# Patient Record
Sex: Male | Born: 1945 | Race: White | Hispanic: No | Marital: Married | Smoking: Never smoker
Health system: Southern US, Community
[De-identification: ages and names within clinical notes are randomized; demographics above are authoritative.]

## PROBLEM LIST (undated history)

## (undated) DIAGNOSIS — K429 Umbilical hernia without obstruction or gangrene: Secondary | ICD-10-CM

## (undated) DIAGNOSIS — F1010 Alcohol abuse, uncomplicated: Secondary | ICD-10-CM

## (undated) DIAGNOSIS — K5792 Diverticulitis of intestine, part unspecified, without perforation or abscess without bleeding: Secondary | ICD-10-CM

## (undated) HISTORY — DX: Umbilical hernia without obstruction or gangrene: K42.9

## (undated) HISTORY — DX: Alcohol abuse, uncomplicated: F10.10

## (undated) HISTORY — DX: Diverticulitis of intestine, part unspecified, without perforation or abscess without bleeding: K57.92

## (undated) NOTE — *Deleted (*Deleted)
SATURATION QUALIFICATIONS: (This note is used to comply with regulatory documentation for home oxygen)  Patient Saturations on Room Air at Rest = ***%  Patient Saturations on Room Air while Ambulating = ***%  Patient Saturations on *** Liters of oxygen while Ambulating = ***%  Please briefly explain why patient needs home oxygen: 

---

## 2013-04-30 ENCOUNTER — Emergency Department: Payer: Self-pay | Admitting: Emergency Medicine

## 2013-04-30 LAB — COMPREHENSIVE METABOLIC PANEL
Albumin: 3.7 g/dL (ref 3.4–5.0)
Alkaline Phosphatase: 70 U/L (ref 50–136)
Anion Gap: 6 — ABNORMAL LOW (ref 7–16)
Bilirubin,Total: 0.2 mg/dL (ref 0.2–1.0)
Calcium, Total: 9 mg/dL (ref 8.5–10.1)
Co2: 28 mmol/L (ref 21–32)
Creatinine: 1.05 mg/dL (ref 0.60–1.30)
EGFR (African American): >60
EGFR (Non-African Amer.): >60
Glucose: 144 mg/dL — ABNORMAL HIGH (ref 65–99)
Potassium: 4 mmol/L (ref 3.5–5.1)
SGOT(AST): 23 U/L (ref 15–37)
SGPT (ALT): 31 U/L (ref 12–78)
Sodium: 139 mmol/L (ref 136–145)
Total Protein: 7.7 g/dL (ref 6.4–8.2)

## 2013-04-30 LAB — CBC
HCT: 42.2 % (ref 40.0–52.0)
HGB: 14.5 g/dL (ref 13.0–18.0)
MCHC: 34.3 g/dL (ref 32.0–36.0)
MCV: 101 fL — ABNORMAL HIGH (ref 80–100)
Platelet: 233 10*3/uL (ref 150–440)
RDW: 13.5 % (ref 11.5–14.5)
WBC: 10.3 10*3/uL (ref 3.8–10.6)

## 2013-04-30 LAB — ETHANOL
Ethanol %: <0.003 % (ref 0.000–0.080)
Ethanol: <3 mg/dL

## 2013-04-30 LAB — DRUG SCREEN, URINE
Amphetamines, Ur Screen: NEGATIVE (ref ?–1000)
Cannabinoid 50 Ng, Ur ~~LOC~~: NEGATIVE (ref ?–50)
Cocaine Metabolite,Ur ~~LOC~~: NEGATIVE (ref ?–300)
Methadone, Ur Screen: NEGATIVE (ref ?–300)
Opiate, Ur Screen: NEGATIVE (ref ?–300)

## 2013-04-30 LAB — TSH: Thyroid Stimulating Horm: 2.84 u[IU]/mL

## 2014-03-13 ENCOUNTER — Emergency Department: Payer: Self-pay | Admitting: Emergency Medicine

## 2014-03-13 LAB — URINALYSIS, COMPLETE
BILIRUBIN, UR: NEGATIVE
Bacteria: NONE SEEN
GLUCOSE, UR: NEGATIVE mg/dL (ref 0–75)
Ketone: NEGATIVE
LEUKOCYTE ESTERASE: NEGATIVE
Nitrite: NEGATIVE
PH: 6 (ref 4.5–8.0)
Protein: NEGATIVE
RBC,UR: NONE SEEN /HPF (ref 0–5)
SPECIFIC GRAVITY: 1.006 (ref 1.003–1.030)
Squamous Epithelial: NONE SEEN

## 2014-03-13 LAB — CBC
HCT: 49.7 % (ref 40.0–52.0)
HGB: 16.9 g/dL (ref 13.0–18.0)
MCH: 34.7 pg — ABNORMAL HIGH (ref 26.0–34.0)
MCHC: 34 g/dL (ref 32.0–36.0)
MCV: 102 fL — ABNORMAL HIGH (ref 80–100)
Platelet: 211 10*3/uL (ref 150–440)
RBC: 4.87 10*6/uL (ref 4.40–5.90)
RDW: 13.1 % (ref 11.5–14.5)
WBC: 8.3 10*3/uL (ref 3.8–10.6)

## 2014-03-13 LAB — COMPREHENSIVE METABOLIC PANEL
ALT: 28 U/L
Albumin: 3.7 g/dL (ref 3.4–5.0)
Alkaline Phosphatase: 61 U/L
Anion Gap: 10 (ref 7–16)
BUN: 11 mg/dL (ref 7–18)
Bilirubin,Total: 0.4 mg/dL (ref 0.2–1.0)
CALCIUM: 8.8 mg/dL (ref 8.5–10.1)
CREATININE: 1.36 mg/dL — AB (ref 0.60–1.30)
Chloride: 97 mmol/L — ABNORMAL LOW (ref 98–107)
Co2: 29 mmol/L (ref 21–32)
EGFR (African American): >60
EGFR (Non-African Amer.): 54 — ABNORMAL LOW
Glucose: 110 mg/dL — ABNORMAL HIGH (ref 65–99)
Osmolality: 272 (ref 275–301)
Potassium: 3.9 mmol/L (ref 3.5–5.1)
SGOT(AST): 28 U/L (ref 15–37)
SODIUM: 136 mmol/L (ref 136–145)
Total Protein: 9.2 g/dL — ABNORMAL HIGH (ref 6.4–8.2)

## 2014-03-13 LAB — CLOSTRIDIUM DIFFICILE(ARMC)

## 2014-03-13 LAB — MAGNESIUM: Magnesium: 2.3 mg/dL
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
## 2016-11-03 DIAGNOSIS — L409 Psoriasis, unspecified: Secondary | ICD-10-CM | POA: Diagnosis not present

## 2016-11-16 DIAGNOSIS — L409 Psoriasis, unspecified: Secondary | ICD-10-CM | POA: Diagnosis not present

## 2016-11-16 DIAGNOSIS — B354 Tinea corporis: Secondary | ICD-10-CM | POA: Diagnosis not present

## 2016-12-20 DIAGNOSIS — Z5181 Encounter for therapeutic drug level monitoring: Secondary | ICD-10-CM | POA: Diagnosis not present

## 2016-12-20 DIAGNOSIS — B354 Tinea corporis: Secondary | ICD-10-CM | POA: Diagnosis not present

## 2016-12-29 DIAGNOSIS — Z5181 Encounter for therapeutic drug level monitoring: Secondary | ICD-10-CM | POA: Diagnosis not present

## 2017-02-15 DIAGNOSIS — B354 Tinea corporis: Secondary | ICD-10-CM | POA: Diagnosis not present

## 2017-02-15 DIAGNOSIS — B351 Tinea unguium: Secondary | ICD-10-CM | POA: Diagnosis not present

## 2018-01-16 DIAGNOSIS — B354 Tinea corporis: Secondary | ICD-10-CM | POA: Diagnosis not present

## 2018-01-16 DIAGNOSIS — B351 Tinea unguium: Secondary | ICD-10-CM | POA: Diagnosis not present

## 2018-04-28 DIAGNOSIS — B351 Tinea unguium: Secondary | ICD-10-CM | POA: Diagnosis not present

## 2018-09-19 ENCOUNTER — Encounter (INDEPENDENT_AMBULATORY_CARE_PROVIDER_SITE_OTHER): Payer: Self-pay

## 2018-09-19 ENCOUNTER — Encounter: Payer: Self-pay | Admitting: Primary Care

## 2018-09-19 ENCOUNTER — Ambulatory Visit (INDEPENDENT_AMBULATORY_CARE_PROVIDER_SITE_OTHER): Payer: Medicare Other | Admitting: Primary Care

## 2018-09-19 VITALS — BP 122/80 | HR 62 | Temp 97.8°F | Ht 66.0 in | Wt 203.0 lb

## 2018-09-19 DIAGNOSIS — R413 Other amnesia: Secondary | ICD-10-CM | POA: Diagnosis not present

## 2018-09-19 DIAGNOSIS — E669 Obesity, unspecified: Secondary | ICD-10-CM | POA: Diagnosis not present

## 2018-09-19 DIAGNOSIS — E66811 Obesity, class 1: Secondary | ICD-10-CM

## 2018-09-19 DIAGNOSIS — Z125 Encounter for screening for malignant neoplasm of prostate: Secondary | ICD-10-CM

## 2018-09-19 DIAGNOSIS — Z1159 Encounter for screening for other viral diseases: Secondary | ICD-10-CM

## 2018-09-19 DIAGNOSIS — G309 Alzheimer's disease, unspecified: Secondary | ICD-10-CM | POA: Insufficient documentation

## 2018-09-19 DIAGNOSIS — F01518 Vascular dementia, unspecified severity, with other behavioral disturbance: Secondary | ICD-10-CM | POA: Insufficient documentation

## 2018-09-19 LAB — CBC
HCT: 44.6 % (ref 39.0–52.0)
Hemoglobin: 15.1 g/dL (ref 13.0–17.0)
MCHC: 33.9 g/dL (ref 30.0–36.0)
MCV: 102.6 fl — AB (ref 78.0–100.0)
Platelets: 270 10*3/uL (ref 150.0–400.0)
RBC: 4.35 Mil/uL (ref 4.22–5.81)
RDW: 13.5 % (ref 11.5–15.5)
WBC: 8.3 10*3/uL (ref 4.0–10.5)

## 2018-09-19 LAB — COMPREHENSIVE METABOLIC PANEL
ALBUMIN: 4.2 g/dL (ref 3.5–5.2)
ALT: 25 U/L (ref 0–53)
AST: 21 U/L (ref 0–37)
Alkaline Phosphatase: 45 U/L (ref 39–117)
BILIRUBIN TOTAL: 0.4 mg/dL (ref 0.2–1.2)
BUN: 11 mg/dL (ref 6–23)
CO2: 28 mEq/L (ref 19–32)
Calcium: 9.2 mg/dL (ref 8.4–10.5)
Chloride: 103 mEq/L (ref 96–112)
Creatinine, Ser: 1.12 mg/dL (ref 0.40–1.50)
GFR: 64.55 mL/min (ref >60.00)
Glucose, Bld: 88 mg/dL (ref 70–99)
Potassium: 4.3 mEq/L (ref 3.5–5.1)
SODIUM: 137 meq/L (ref 135–145)
TOTAL PROTEIN: 7.6 g/dL (ref 6.0–8.3)

## 2018-09-19 LAB — LDL CHOLESTEROL, DIRECT: Direct LDL: 120 mg/dL

## 2018-09-19 LAB — VITAMIN B12: VITAMIN B 12: 162 pg/mL — AB (ref 211–911)

## 2018-09-19 LAB — LIPID PANEL
Cholesterol: 168 mg/dL (ref 0–200)
HDL: 26.6 mg/dL — ABNORMAL LOW (ref >39.00)
NonHDL: 141.35
TRIGLYCERIDES: 241 mg/dL — AB (ref 0.0–149.0)
Total CHOL/HDL Ratio: 6
VLDL: 48.2 mg/dL — ABNORMAL HIGH (ref 0.0–40.0)

## 2018-09-19 LAB — TSH: TSH: 3.18 u[IU]/mL (ref 0.35–4.50)

## 2018-09-19 LAB — PSA, MEDICARE: PSA: 0.64 ng/mL (ref 0.10–4.00)

## 2018-09-19 NOTE — Assessment & Plan Note (Signed)
Gradual over the last 1 to 2 years, more progressive over the last several months per family. He does believe it is the year 2010 and cannot recall the month or day of week. MMSE score of 14 out of 30, but cannot read or write so 10 points were not valid for counting. Based off of my exam he does appear to have some mild cognitive decline without behavioral disturbance.  We had a long discussion regarding dementia including and further testing versus trial of donepezil.  He kindly declines further testing through neurology and/or medication to help slow the progression.  He will notify us if he changes his mind.  We will check labs today including B12, TSH, CBC, CMP, hep C, lipids.

## 2018-09-19 NOTE — Progress Notes (Signed)
Subjective:    Patient ID: Kurt Bailey, male    DOB: 1945-12-29, 73 y.o.   MRN: [id number]  HPI  Kurt Bailey is a 73 year old male who presents today to establish care and discuss the problems mentioned below. Will obtain old records. He's not had a physical in years.  He is with his wife today who is providing some information for HPI.  1) Memory Problems: His daughter and wife have noticed slight changes over the last 1 to 2 years, more progressive changes over the last several months.  Changes include asking repetitive questions, is more forgetful, makes wrong turns when driving, unable to recall recent information provided.   He endorses that he has noticed some forgetfulness. He denies difficulty sleeping, anxiety, depression. He is illiterate and cannot read or write, this was diagnosed nearly 40+ years ago.  He denies chest pain, dizziness, headaches, changes in speech.  Review of Systems  Constitutional: Negative for fatigue.  Eyes: Negative for visual disturbance.  Respiratory: Negative for shortness of breath.   Cardiovascular: Negative for chest pain.  Neurological: Negative for dizziness, weakness and headaches.       See HPI       Past Medical History:  Diagnosis Date  . Alcohol abuse   . Diverticulitis   . Periumbilical hernia      Social History   Socioeconomic History  . Marital status: Married    Spouse name: Not on file  . Number of children: Not on file  . Years of education: Not on file  . Highest education level: Not on file  Occupational History  . Not on file  Social Needs  . Financial resource strain: Not on file  . Food insecurity:    Worry: Not on file    Inability: Not on file  . Transportation needs:    Medical: Not on file    Non-medical: Not on file  Tobacco Use  . Smoking status: Never Smoker  . Smokeless tobacco: Never Used  Substance and Sexual Activity  . Alcohol use: Not Currently  . Drug use: Not on file  . Sexual  activity: Not on file  Lifestyle  . Physical activity:    Days per week: Not on file    Minutes per session: Not on file  . Stress: Not on file  Relationships  . Social connections:    Talks on phone: Not on file    Gets together: Not on file    Attends religious service: Not on file    Active member of club or organization: Not on file    Attends meetings of clubs or organizations: Not on file    Relationship status: Not on file  . Intimate partner violence:    Fear of current or ex partner: Not on file    Emotionally abused: Not on file    Physically abused: Not on file    Forced sexual activity: Not on file  Other Topics Concern  . Not on file  Social History Narrative   Married.   4 children.   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   Enjoys relaxing, walking.    History reviewed. No pertinent surgical history.  Family History  Problem Relation Age of Onset  . Alcohol abuse Father   . Stroke Father   . Hypertension Father   . Emphysema Father   . Breast cancer Sister   . Heart attack Brother   . Aneurysm Sister   . Stroke Brother   .  Hyperlipidemia Brother     No Known Allergies  No current outpatient medications on file prior to visit.   No current facility-administered medications on file prior to visit.     BP 122/80   Pulse 62   Temp 97.8 F (36.6 C) (Oral)   Ht 5\' 6"  (1.676 m)   Wt 203 lb (92.1 kg)   SpO2 96%   BMI 32.77 kg/m    Objective:   Physical Exam  Constitutional: He appears well-nourished.  Neck: Neck supple.  Cardiovascular: Normal rate and regular rhythm.  Respiratory: Effort normal and breath sounds normal.  Neurological: He is alert.  Alert to person and place, not alert to time.  Skin: Skin is warm and dry.  Psychiatric: He has a normal mood and affect.           Assessment & Plan:

## 2018-09-19 NOTE — Patient Instructions (Signed)
Stop by the lab prior to leaving today. I will notify you of your results once received.   Please consider the memory medication to help slow memory loss.  We could also send you to the neurologist for formal memory testing as discussed.  It was a pleasure to meet you today! Please don't hesitate to call or message me with any questions. Welcome to [facility]!

## 2018-09-20 ENCOUNTER — Other Ambulatory Visit: Payer: Self-pay | Admitting: Primary Care

## 2018-09-20 DIAGNOSIS — E538 Deficiency of other specified B group vitamins: Secondary | ICD-10-CM

## 2018-09-20 LAB — HEPATITIS C ANTIBODY
HEP C AB: NONREACTIVE
SIGNAL TO CUT-OFF: 0.03 (ref <1.00)

## 2018-09-20 NOTE — Assessment & Plan Note (Signed)
Evident on recent labs, could be contributing to memory changes. Recommend he start monthly B12 injections x6 months with repeat labs in 3 months.

## 2018-09-22 ENCOUNTER — Encounter: Payer: Self-pay | Admitting: *Deleted

## 2018-10-02 ENCOUNTER — Telehealth: Payer: Self-pay | Admitting: Primary Care

## 2018-10-02 DIAGNOSIS — R413 Other amnesia: Secondary | ICD-10-CM

## 2018-10-02 NOTE — Telephone Encounter (Signed)
Noted, please notify patient's family that referral was placed.

## 2018-10-02 NOTE — Telephone Encounter (Signed)
Mrs Gaye called and was transferred to me. She now says they do want to go to see a Neurologist. Please place New Neurology Referral. Please put in comments that they want to go to [facility] for the appointment.

## 2018-10-03 ENCOUNTER — Telehealth: Payer: Self-pay

## 2018-10-03 NOTE — Telephone Encounter (Signed)
Noted. Agree with plan.

## 2018-10-03 NOTE — Telephone Encounter (Signed)
Patient's spouse has been notified of Graylon Gunning comments

## 2018-10-03 NOTE — Telephone Encounter (Signed)
Pt is not feeling well since 10/02/18, prod cough with Not sure what color phlegm, not sure has fever. Pt has been laying down most of the day. Pt is not eating and just slightly drinking a little bit; started after eating at [facility]. No diarrhea, no N&V. Pt does not want to take B 12 shots and pts wife is giving pt 1/2 tab of Vit B 12 1000mg . At this time . pts wife wants to know what to do. Offered to schedule an appt for 10/04/18. pts wife is not sure if he will come in for appt. If pt condition worsens tonight pt to go to ED. pts wife will cb with update in AM. FYI to Allayne Gitelman, NP.

## 2018-10-04 NOTE — Telephone Encounter (Addendum)
Noted. Thank you Rena for your attention to this patient. I appreciate you!

## 2018-10-04 NOTE — Telephone Encounter (Signed)
pts wife calling; pt has not eaten since 10/02/18. Pt is still asleep this morning; yesterday had diarrhea but does not know how many times; pt was warm to touch, no thermometer; pt got up while I was speaking with Allayne Gitelman NP; pt feels warm, still having diarrhea; not sure if watery or not, not vomiting but has no appetite so pt is not eating. Not feeling weak now and pt said he is going to try to drink something; if pt develops N&V, watery diarrhea, high fever pt should go to ED for eval and possible IV fluids. Mrs Maritato said she would watch pt and see how he does and will take to ED if cannot drink or has N&V&watery diarrhea. Mrs Reckner will cb if needed.FYI to Allayne Gitelman NP.

## 2018-12-29 ENCOUNTER — Telehealth: Payer: Self-pay

## 2018-12-29 NOTE — Telephone Encounter (Signed)
Left detailed VM w COVID screen and curbside info   

## 2019-01-01 ENCOUNTER — Other Ambulatory Visit (INDEPENDENT_AMBULATORY_CARE_PROVIDER_SITE_OTHER): Payer: Medicare Other

## 2019-01-01 DIAGNOSIS — E538 Deficiency of other specified B group vitamins: Secondary | ICD-10-CM | POA: Diagnosis not present

## 2019-01-01 LAB — VITAMIN B12: Vitamin B-12: 382 pg/mL (ref 211–911)

## 2019-01-09 ENCOUNTER — Other Ambulatory Visit: Payer: Medicare Other

## 2019-03-05 ENCOUNTER — Other Ambulatory Visit: Payer: Self-pay | Admitting: Neurology

## 2019-03-05 DIAGNOSIS — R413 Other amnesia: Secondary | ICD-10-CM | POA: Diagnosis not present

## 2019-03-20 ENCOUNTER — Other Ambulatory Visit: Payer: Self-pay

## 2019-03-20 ENCOUNTER — Ambulatory Visit (HOSPITAL_COMMUNITY)
Admission: RE | Admit: 2019-03-20 | Discharge: 2019-03-20 | Disposition: A | Discharge status: Home / Self Care | Payer: Medicare Other | Source: Ambulatory Visit | Attending: Neurology | Admitting: Neurology

## 2019-03-20 DIAGNOSIS — R413 Other amnesia: Secondary | ICD-10-CM | POA: Diagnosis not present

## 2019-07-04 ENCOUNTER — Telehealth: Payer: Self-pay | Admitting: Primary Care

## 2019-07-04 ENCOUNTER — Encounter: Payer: Self-pay | Admitting: Primary Care

## 2019-07-04 ENCOUNTER — Ambulatory Visit (INDEPENDENT_AMBULATORY_CARE_PROVIDER_SITE_OTHER): Payer: Medicare Other | Admitting: Primary Care

## 2019-07-04 ENCOUNTER — Other Ambulatory Visit: Payer: Self-pay

## 2019-07-04 VITALS — BP 124/84 | HR 82 | Temp 97.0°F | Ht 66.0 in | Wt 205.0 lb

## 2019-07-04 DIAGNOSIS — E785 Hyperlipidemia, unspecified: Secondary | ICD-10-CM | POA: Diagnosis not present

## 2019-07-04 DIAGNOSIS — E538 Deficiency of other specified B group vitamins: Secondary | ICD-10-CM

## 2019-07-04 DIAGNOSIS — R413 Other amnesia: Secondary | ICD-10-CM

## 2019-07-04 LAB — LIPID PANEL
Cholesterol: 213 mg/dL — ABNORMAL HIGH (ref 0–200)
HDL: 30.1 mg/dL — ABNORMAL LOW (ref >39.00)
NonHDL: 182.56
Total CHOL/HDL Ratio: 7
Triglycerides: 260 mg/dL — ABNORMAL HIGH (ref 0.0–149.0)
VLDL: 52 mg/dL — ABNORMAL HIGH (ref 0.0–40.0)

## 2019-07-04 LAB — LDL CHOLESTEROL, DIRECT: Direct LDL: 145 mg/dL

## 2019-07-04 LAB — VITAMIN B12: Vitamin B-12: 558 pg/mL (ref 211–911)

## 2019-07-04 NOTE — Assessment & Plan Note (Signed)
Evaluated by neurology who is concerned about dementia. MRI with moderate advanced changes.  Continue Depakote and Namenda.

## 2019-07-04 NOTE — Telephone Encounter (Signed)
Pt's wife called back and said that he agreed to go on cholesterol lowering medicine.  CB [phone]

## 2019-07-04 NOTE — Assessment & Plan Note (Signed)
Could not tolerate B12 injections, doing well on OTC 1000 mcg. Repeat B12 level pending.

## 2019-07-04 NOTE — Progress Notes (Signed)
Subjective:    Patient ID: Kurt Bailey, male    DOB: 1946/06/15, 73 y.o.   MRN: [id number]  HPI  [name] is a 73 year old male with a history of vitamin B12 deficiency, chronic memory changes who presents today for follow up.  1) Vitamin B12 Deficiency: Currently managed on oral B12 1000 mcg daily. His original B12 level was 162 in February 2020, repeat level of 382 in June 2020.  2) Memory Changes: Currently following with Neurology through [facility], original visit being 03/05/19. During his initial visit he was initiated on Depakoe 125 mg BID for mood stabilization. He completed a MRI in mid August 2020 with advanced white matter changes, chronic microvascular changes. He was initiated on memantine 5 mg BID.  Overall he's doing okay with his memory, wife reports "good and bad days". He is compliant to his prescribed regimen, has had some problems with Depakote, has been in touch with his neurologist.   BP Readings from Last 3 Encounters:  07/04/19 124/84  09/19/18 122/80   The 10-year ASCVD risk score Kurt George DC Jr., et al., 2013) is: 23.1%   Values used to calculate the score:     Age: 31 years     Sex: Male     Is Non-Hispanic African American: No     Diabetic: No     Tobacco smoker: No     Systolic Blood Pressure: 124 mmHg     Is BP treated: No     HDL Cholesterol: 26.6 mg/dL     Total Cholesterol: 168 mg/dL   Review of Systems  Constitutional: Negative for fever.  Cardiovascular: Negative for chest pain.  Gastrointestinal: Negative for nausea and vomiting.  Neurological: Negative for dizziness.       Past Medical History:  Diagnosis Date  . Alcohol abuse   . Diverticulitis   . Periumbilical hernia      Social History   Socioeconomic History  . Marital status: Married    Spouse name: Not on file  . Number of children: Not on file  . Years of education: Not on file  . Highest education level: Not on file  Occupational History  . Not on file  Social  Needs  . Financial resource strain: Not on file  . Food insecurity    Worry: Not on file    Inability: Not on file  . Transportation needs    Medical: Not on file    Non-medical: Not on file  Tobacco Use  . Smoking status: Never Smoker  . Smokeless tobacco: Never Used  Substance and Sexual Activity  . Alcohol use: Not Currently  . Drug use: Not on file  . Sexual activity: Not on file  Lifestyle  . Physical activity    Days per week: Not on file    Minutes per session: Not on file  . Stress: Not on file  Relationships  . Social Musician on phone: Not on file    Gets together: Not on file    Attends religious service: Not on file    Active member of club or organization: Not on file    Attends meetings of clubs or organizations: Not on file    Relationship status: Not on file  . Intimate partner violence    Fear of current or ex partner: Not on file    Emotionally abused: Not on file    Physically abused: Not on file    Forced sexual activity:  Not on file  Other Topics Concern  . Not on file  Social History Narrative   Married.   4 children.   Retired, once worked in Dispensing optician.   Enjoys relaxing, walking.    No past surgical history on file.  Family History  Problem Relation Age of Onset  . Alcohol abuse Father   . Stroke Father   . Hypertension Father   . Emphysema Father   . Breast cancer Sister   . Heart attack Brother   . Aneurysm Sister   . Stroke Brother   . Hyperlipidemia Brother     No Known Allergies  Current Outpatient Medications on File Prior to Visit  Medication Sig Dispense Refill  . divalproex (DEPAKOTE) 125 MG DR tablet Take 125 mg by mouth 2 (two) times daily.    . memantine (NAMENDA) 5 MG tablet Take by mouth.    . Omega-3 1000 MG CAPS Take by mouth.    . vitamin B-12 (CYANOCOBALAMIN) 1000 MCG tablet Take by mouth.     No current facility-administered medications on file prior to visit.     BP 124/84   Pulse 82   Temp  (!) 97 F (36.1 C) (Temporal)   Ht 5\' 6"  (1.676 m)   Wt 205 lb (93 kg)   SpO2 95%   BMI 33.09 kg/m    Objective:   Physical Exam  Constitutional: He appears well-nourished.  Neck: Neck supple.  Cardiovascular: Normal rate and regular rhythm.  Respiratory: Effort normal and breath sounds normal.  Skin: Skin is warm and dry.  Psychiatric: He has a normal mood and affect.           Assessment & Plan:

## 2019-07-04 NOTE — Patient Instructions (Signed)
Stop by the lab prior to leaving today. I will notify you of your results once received.   Follow up with the neurologist as scheduled.  It was a pleasure to see you today!

## 2019-07-04 NOTE — Assessment & Plan Note (Signed)
Repeat lipids pending. ASCVD Risk score of 23%. Given microvascular changes and dementia recommended treatment.  He and his family will think about this. Await results.

## 2019-07-05 MED ORDER — ATORVASTATIN CALCIUM 40 MG PO TABS: 40.0000 mg | ORAL_TABLET | Freq: Every evening | ORAL | 3 refills | Status: DC

## 2019-07-05 NOTE — Telephone Encounter (Signed)
Noted, Rx sent to pharmacy for atorvastatin 40 mg. Patient needs lab only appointment in 6 weeks for repeat cholesterol, will you please schedule?

## 2019-07-05 NOTE — Telephone Encounter (Signed)
Noted, labs ordered.

## 2019-07-05 NOTE — Telephone Encounter (Signed)
Pt scheduled for labs on 08/15/18 @ 10:30am.

## 2019-08-07 DIAGNOSIS — F0151 Vascular dementia with behavioral disturbance: Secondary | ICD-10-CM | POA: Diagnosis not present

## 2019-08-07 DIAGNOSIS — G309 Alzheimer's disease, unspecified: Secondary | ICD-10-CM | POA: Diagnosis not present

## 2019-08-07 DIAGNOSIS — Z79899 Other long term (current) drug therapy: Secondary | ICD-10-CM | POA: Diagnosis not present

## 2019-08-14 ENCOUNTER — Telehealth: Payer: Self-pay

## 2019-08-14 NOTE — Telephone Encounter (Signed)
LVM w COVID screen and back lab info 1.12.2021 TLJ  

## 2019-08-16 ENCOUNTER — Other Ambulatory Visit (INDEPENDENT_AMBULATORY_CARE_PROVIDER_SITE_OTHER): Payer: Medicare Other

## 2019-08-16 DIAGNOSIS — Z79899 Other long term (current) drug therapy: Secondary | ICD-10-CM | POA: Diagnosis not present

## 2019-08-16 DIAGNOSIS — E785 Hyperlipidemia, unspecified: Secondary | ICD-10-CM

## 2019-08-16 LAB — HEPATIC FUNCTION PANEL
ALT: 30 U/L (ref 0–53)
AST: 26 U/L (ref 0–37)
Albumin: 4.2 g/dL (ref 3.5–5.2)
Alkaline Phosphatase: 51 U/L (ref 39–117)
Bilirubin, Direct: 0.1 mg/dL (ref 0.0–0.3)
Total Bilirubin: 0.5 mg/dL (ref 0.2–1.2)
Total Protein: 7.6 g/dL (ref 6.0–8.3)

## 2019-08-16 LAB — LIPID PANEL
Cholesterol: 106 mg/dL (ref 0–200)
HDL: 35.2 mg/dL — ABNORMAL LOW (ref >39.00)
LDL Cholesterol: 54 mg/dL (ref 0–99)
NonHDL: 71.23
Total CHOL/HDL Ratio: 3
Triglycerides: 87 mg/dL (ref 0.0–149.0)
VLDL: 17.4 mg/dL (ref 0.0–40.0)

## 2019-08-17 ENCOUNTER — Encounter: Payer: Self-pay | Admitting: *Deleted

## 2019-08-23 ENCOUNTER — Telehealth: Payer: Self-pay | Admitting: Adult Health Nurse Practitioner

## 2019-08-23 NOTE — Telephone Encounter (Signed)
Called patient's home number to schedule the Palliative Consult, no answer and unable to leave message no voicemail set up.  I then called wife's cell # with no answer, left message with reason for call along with my contact information.

## 2019-08-27 ENCOUNTER — Telehealth: Payer: Self-pay | Admitting: Adult Health Nurse Practitioner

## 2019-08-27 NOTE — Telephone Encounter (Signed)
Rec'd call back from patient's wife Luetishia and after discussing Palliative services with her she was in agreement with this.  I have scheduled a Telephone Consult for 09/04/19 @ 11 AM.

## 2019-08-27 NOTE — Telephone Encounter (Signed)
Called home number to schedule Palliative Consult, no answer and no voicemail set up to leave a message.  I then called patient's wife's cell - no answer, left message with reason for call along with my contact information.  I also called patient's daughter Drue Novel to make sure that I had the correct numbers, no answer - left message with my contact information.

## 2019-09-04 ENCOUNTER — Other Ambulatory Visit: Payer: Medicare Other | Admitting: Adult Health Nurse Practitioner

## 2019-09-04 ENCOUNTER — Other Ambulatory Visit: Payer: Self-pay

## 2019-09-04 DIAGNOSIS — F0391 Unspecified dementia with behavioral disturbance: Secondary | ICD-10-CM | POA: Diagnosis not present

## 2019-09-04 DIAGNOSIS — Z515 Encounter for palliative care: Secondary | ICD-10-CM | POA: Diagnosis not present
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
## 2019-09-11 ENCOUNTER — Telehealth: Payer: Self-pay | Admitting: Primary Care

## 2019-09-11 NOTE — Telephone Encounter (Signed)
Spoken and notified patient's wife of Kate Clark's comments. Patient's wife verbalized understanding.  

## 2019-09-11 NOTE — Telephone Encounter (Signed)
Please advise. Would like patient to schedule a follow up soon?

## 2019-09-11 NOTE — Telephone Encounter (Signed)
Spouse called wanting to know when pt needs to follow up with you.  Last appointment 07/2019 Please advise

## 2019-09-11 NOTE — Telephone Encounter (Signed)
December 2021 is fine, or sooner if they need anything.

## 2019-09-14 ENCOUNTER — Other Ambulatory Visit: Payer: Self-pay

## 2019-09-14 ENCOUNTER — Other Ambulatory Visit: Payer: Medicare Other | Admitting: Adult Health Nurse Practitioner

## 2019-09-14 DIAGNOSIS — Z515 Encounter for palliative care: Secondary | ICD-10-CM

## 2019-09-14 DIAGNOSIS — F0391 Unspecified dementia with behavioral disturbance: Secondary | ICD-10-CM

## 2019-09-14 NOTE — Progress Notes (Signed)
    Therapist, nutritional Palliative Care Consult Note Telephone: [phone]  Fax: [phone]  PATIENT NAME: Kurt Bailey DOB: 1945/12/22 MRN: [id number]  PRIMARY CARE PROVIDER:   Doreene Nest, NP  REFERRING PROVIDER:  Harlin Rain PA  RESPONSIBLE PARTY:   [name], wife C: [phone]   H:  [phone]  Due to the COVID-19 crisis, this visit was done via telemedicine and it was initiated and consent by this patient and or family. Video-audio (telehealth) contact was unable to be done due to technical barriers from the patient's side.      RECOMMENDATIONS and PLAN:  1.  Advanced care planning.  Patient is full code.  Wife did not want to discuss today  2.  Dementia.  Wife states that patient's outbursts have decreased with the increase in Depakote.  States that pretty everything else is unchanged.  Denies falls, pain, SOB, fever, cough, changes in appetite, weight loss, N/V/D, constipation.  Continue follow up and recommendations with neurology.  Continue current namenda and depakote dosages as they seem to be helping.  3. Support.  Wife is very tearful and talks about things in the house that need repaired and they don't have the means to take care of, such as the roof and floor boards in the bathroom.  Indicates that at times they may have limited food in the home but does not come out to say that there may be food insecurity.  She keeps saying that it is all her fault that they are not able to take care of these things.  She does have history of depression and anxiety with SI and states that she hasn't laughed in years.  With help of community liaison have reached out to her PCP with these concerns.    I spent 60 minutes providing this consultation,  including time with patient/family, chart review, provider coordination, and documentation. More than 50% of the time in this consultation was spent coordinating communication.   HISTORY OF PRESENT  ILLNESS:  Kurt Bailey is a 74 y.o. year old male with multiple medical problems including dementia, periumbilical hernia, diverticulitis. Palliative Care was asked to help address goals of care.   CODE STATUS: see above  PPS: 70% HOSPICE ELIGIBILITY/DIAGNOSIS: TBD  PHYSICAL EXAM:   Deferred  PAST MEDICAL HISTORY:  Past Medical History:  Diagnosis Date  . Alcohol abuse   . Diverticulitis   . Periumbilical hernia     SOCIAL HX:  Social History   Tobacco Use  . Smoking status: Never Smoker  . Smokeless tobacco: Never Used  Substance Use Topics  . Alcohol use: Not Currently    ALLERGIES: No Known Allergies   PERTINENT MEDICATIONS:  Outpatient Encounter Medications as of 09/14/2019  Medication Sig  . atorvastatin (LIPITOR) 40 MG tablet Take 1 tablet (40 mg total) by mouth every evening. For cholesterol.  . divalproex (DEPAKOTE) 125 MG DR tablet Take 125 mg by mouth 2 (two) times daily.  . memantine (NAMENDA) 5 MG tablet Take by mouth.  . Omega-3 1000 MG CAPS Take by mouth.  . vitamin B-12 (CYANOCOBALAMIN) 1000 MCG tablet Take by mouth.   No facility-administered encounter medications on file as of 09/14/2019.       Anitta Tenny Marlena Clipper, NP

## 2019-10-23 ENCOUNTER — Telehealth: Payer: Self-pay | Admitting: Adult Health Nurse Practitioner

## 2019-10-23 ENCOUNTER — Other Ambulatory Visit: Payer: Medicare Other | Admitting: Adult Health Nurse Practitioner

## 2019-10-23 DIAGNOSIS — Z515 Encounter for palliative care: Secondary | ICD-10-CM | POA: Diagnosis not present

## 2019-10-23 DIAGNOSIS — F0391 Unspecified dementia with behavioral disturbance: Secondary | ICD-10-CM | POA: Diagnosis not present

## 2019-10-23 NOTE — Progress Notes (Signed)
Therapist, nutritional Palliative Care Consult Note Telephone: [phone]  Fax: [phone]  PATIENT NAME: Kurt Bailey DOB: 12/29/1945 MRN: [id number]  PRIMARY CARE PROVIDER:   Doreene Nest, NP  REFERRING PROVIDER:  Harlin Rain PA  RESPONSIBLE PARTY:  Jeven Topper, wife C: [phone]  H: [phone]   Due to the COVID-19 crisis, this visit was done via telemedicine and it was initiated and consent by this patient and or family. Video-audio (telehealth) contact was unable to be done due to technical barriers from the patient's side.      RECOMMENDATIONS and PLAN:  1.  Advanced care planning.Patient is full code.  Wife did not want to discuss today  2.  Caregiver strain.  This encounter was initiated by patient's wife who called our office in tears and very emotional.  I reached out to patient's wife who was upset that her daughter has not spoken to her in almost a month. Her daughter was trying to help get them free firewood to heat their home but patient nor her son were willing to go pick it up or have daughter use truck to get it for them.  When this did not work out patient's wife stated that daughter said she "was done" and has not spoken to them since.  Patient's wife is feeling like no one cares enough to help. She also talks about work in the bathroom that needs to be done but they do not have the means to get fixed. Feels like everything they do not have money to fix is her fault due to her hoarding.  Offered her emotional support. Patient's wife has history of depression and suicidal ideation. Tried to get her to contact her PCP tomorrow to set up appointment to adjust her depression medications.  She stated that she felt that the medications weren't doing anything.  States that her PCP has recommended counseling and that she felt like counseling wouldn't be helpful.  Still encouraged her to contact the counselor as they can be effective  and help guide her through her emotions and feelings of worthlessness.  Also suggested going to ER and she refused.  She denies thoughts of harming herself but talks about ways of harming herself, such as when using the knife to prepare a meal using the knife on herself.   As she is primary caregiver for patient and having concerns for her mental state, reached out to RN clinical navigator while on phone with caregiver to call for a welfare check.  Of note wife has not allowed an in person visit due state of the home and COVID.    I spent 60 minutes providing this consultation,  from 5:00 to 6:00 including time with patient/family, chart review, provider coordination, and documentation. More than 50% of the time in this consultation was spent coordinating communication.   HISTORY OF PRESENT ILLNESS:  Kurt Bailey is a 74 y.o. year old male with multiple medical problems including dementia, periumbilical hernia, diverticulitis. Palliative Care was asked to help address goals of care.   CODE STATUS: see above  PPS: 70% HOSPICE ELIGIBILITY/DIAGNOSIS: TBD  PHYSICAL EXAM:   Deferred  PAST MEDICAL HISTORY:  Past Medical History:  Diagnosis Date  . Alcohol abuse   . Diverticulitis   . Periumbilical hernia     SOCIAL HX:  Social History   Tobacco Use  . Smoking status: Never Smoker  . Smokeless tobacco: Never Used  Substance Use Topics  . Alcohol use:  Not Currently    ALLERGIES: No Known Allergies   PERTINENT MEDICATIONS:  Outpatient Encounter Medications as of 10/23/2019  Medication Sig  . atorvastatin (LIPITOR) 40 MG tablet Take 1 tablet (40 mg total) by mouth every evening. For cholesterol.  . divalproex (DEPAKOTE) 125 MG DR tablet Take 125 mg by mouth 2 (two) times daily.  . memantine (NAMENDA) 5 MG tablet Take by mouth.  . Omega-3 1000 MG CAPS Take by mouth.  . vitamin B-12 (CYANOCOBALAMIN) 1000 MCG tablet Take by mouth.   No facility-administered encounter medications on  file as of 10/23/2019.      Kurt Bailey Kurt Downer, NP

## 2019-10-23 NOTE — Telephone Encounter (Signed)
Rec'd voicemail from patient's wife yesterday @ 4:55 PM wanting to discontinue Palliative services.  I called wife's cell & home # to confirm discharge from Palliative services and to find out why, with no answer.  Unable to leave message on cell due to mailbox was full and the home # unable to leave message due to no voicemail set up.  Will f/u with wife

## 2019-10-24 ENCOUNTER — Telehealth: Payer: Self-pay | Admitting: Adult Health Nurse Practitioner

## 2019-10-24 ENCOUNTER — Other Ambulatory Visit: Payer: Self-pay

## 2019-10-24 NOTE — Telephone Encounter (Signed)
Attempted calling wife on both home and cell numbers with no answer and unable to leave a message.  She had called yesterday and was in emotional distress and wanted to check on her and the patient.  Will attempt to call tomorrow Kurt Bailey K. Garner Nash NP

## 2020-01-08 DIAGNOSIS — B351 Tinea unguium: Secondary | ICD-10-CM | POA: Diagnosis not present

## 2020-01-08 DIAGNOSIS — B354 Tinea corporis: Secondary | ICD-10-CM | POA: Diagnosis not present

## 2020-03-28 DIAGNOSIS — H6063 Unspecified chronic otitis externa, bilateral: Secondary | ICD-10-CM | POA: Diagnosis not present

## 2020-03-28 DIAGNOSIS — H6123 Impacted cerumen, bilateral: Secondary | ICD-10-CM | POA: Diagnosis not present

## 2020-04-11 DIAGNOSIS — H6121 Impacted cerumen, right ear: Secondary | ICD-10-CM | POA: Diagnosis not present

## 2020-04-18 ENCOUNTER — Telehealth: Payer: Self-pay

## 2020-04-18 NOTE — Telephone Encounter (Signed)
Palliative care volunteer support call made.. Patient "holding his own"

## 2020-05-05 ENCOUNTER — Telehealth: Payer: Self-pay

## 2020-05-05 DIAGNOSIS — U071 COVID-19: Secondary | ICD-10-CM | POA: Diagnosis not present

## 2020-05-05 DIAGNOSIS — Z03818 Encounter for observation for suspected exposure to other biological agents ruled out: Secondary | ICD-10-CM | POA: Diagnosis not present

## 2020-05-05 DIAGNOSIS — R509 Fever, unspecified: Secondary | ICD-10-CM | POA: Diagnosis not present

## 2020-05-05 NOTE — Telephone Encounter (Signed)
Noted. I'm assuming pt did not want to set up a virtual visit?

## 2020-05-05 NOTE — Telephone Encounter (Signed)
Pt wife said pt was positive for covid home test on 05/04/20. Pt's wife said temp last night was 100.1 has not cked temp today. S/T with scratchy voice, pulse ox was 91%; when gets up pt is dizzy.  Pt has prod cough but is not sure what color it is. pts wife is not sure if SOB or not; pt started with diarrhea on 05/04/20; pt is not drinking a lot; not sure if any loss of taste or smell; no vomiting. Pt wife said pt is feeling bad. pts wife is going to take pt to [facility]. UC * ED precautions given.

## 2020-05-05 NOTE — Telephone Encounter (Signed)
Yes ma'am; pt wife felt like pt was very sick and needed to see someone. Also not sure would be able to do a virtual video visit after talking with pts wife.

## 2020-05-09 NOTE — Telephone Encounter (Signed)
LVM on monoclonal antibody infusion line with pt information.  °

## 2020-05-09 NOTE — Telephone Encounter (Signed)
Pt's wife called asking if he would qualify for the infusion.

## 2020-05-09 NOTE — Telephone Encounter (Signed)
Yes--they should both get the infusion  Please make the referral

## 2020-05-09 NOTE — Telephone Encounter (Signed)
Dr Alphonsus Sias, Jae Dire is out of the office. Would you approve his infusion and send both his and his wife's chart to [facility] to schedule once approved? Thanks.

## 2020-05-10 ENCOUNTER — Telehealth: Payer: Self-pay | Admitting: Unknown Physician Specialty

## 2020-05-10 ENCOUNTER — Other Ambulatory Visit: Payer: Self-pay | Admitting: Unknown Physician Specialty

## 2020-05-10 DIAGNOSIS — U071 COVID-19: Secondary | ICD-10-CM

## 2020-05-10 NOTE — Progress Notes (Signed)
I connected by phone with Kurt Bailey daughter on 05/10/2020 at 1:51 PM to discuss the potential use of a new treatment for mild to moderate COVID-19 viral infection in non-hospitalized patients.  This patient is a 74 y.o. male that meets the FDA criteria for Emergency Use Authorization of COVID monoclonal antibody casirivimab/imdevimab or bamlanivimab/eteseviamb.  Has a (+) direct SARS-CoV-2 viral test result  Has mild or moderate COVID-19   Is NOT hospitalized due to COVID-19  Is within 10 days of symptom onset  Has at least one of the high risk factor(s) for progression to severe COVID-19 and/or hospitalization as defined in EUA.  Specific high risk criteria : BMI > 25   I have spoken and communicated the following to the patient or parent/caregiver regarding COVID monoclonal antibody treatment:  1. FDA has authorized the emergency use for the treatment of mild to moderate COVID-19 in adults and pediatric patients with positive results of direct SARS-CoV-2 viral testing who are 89 years of age and older weighing at least 40 kg, and who are at high risk for progressing to severe COVID-19 and/or hospitalization.  2. The significant known and potential risks and benefits of COVID monoclonal antibody, and the extent to which such potential risks and benefits are unknown.  3. Information on available alternative treatments and the risks and benefits of those alternatives, including clinical trials.  4. Patients treated with COVID monoclonal antibody should continue to self-isolate and use infection control measures (e.g., wear mask, isolate, social distance, avoid sharing personal items, clean and disinfect "high touch" surfaces, and frequent handwashing) according to CDC guidelines.   5. The patient or parent/caregiver has the option to accept or refuse COVID monoclonal antibody treatment.  After reviewing this information with the patient's daughter, the patient has agreed to  receive one of the available covid 19 monoclonal antibodies and will be provided an appropriate fact sheet prior to infusion. [name], NP 05/10/2020 1:51 PM

## 2020-05-10 NOTE — Telephone Encounter (Signed)
I connected by phone with Kurt Bailey wife on 05/10/2020 at 10:54 AM to discuss the potential use of a new treatment for mild to moderate COVID-19 viral infection in non-hospitalized patients.  This patient is a 74 y.o. male that meets the FDA criteria for Emergency Use Authorization of COVID monoclonal antibody casirivimab/imdevimab or bamlanivimab/eteseviamb.  Has a (+) direct SARS-CoV-2 viral test result  Has mild or moderate COVID-19   Is NOT hospitalized due to COVID-19  Is within 10 days of symptom onset  Has at least one of the high risk factor(s) for progression to severe COVID-19 and/or hospitalization as defined in EUA.  Specific high risk criteria : Older age (>/= 73 yo) and BMI > 25   I have spoken and communicated the following to the patient or parent/caregiver regarding COVID monoclonal antibody treatment:  1. FDA has authorized the emergency use for the treatment of mild to moderate COVID-19 in adults and pediatric patients with positive results of direct SARS-CoV-2 viral testing who are 35 years of age and older weighing at least 40 kg, and who are at high risk for progressing to severe COVID-19 and/or hospitalization.  2. The significant known and potential risks and benefits of COVID monoclonal antibody, and the extent to which such potential risks and benefits are unknown.  3. Information on available alternative treatments and the risks and benefits of those alternatives, including clinical trials.  4. Patients treated with COVID monoclonal antibody should continue to self-isolate and use infection control measures (e.g., wear mask, isolate, social distance, avoid sharing personal items, clean and disinfect high touch surfaces, and frequent handwashing) according to CDC guidelines.   5. The patient or parent/caregiver has the option to accept or refuse COVID monoclonal antibody treatment.  After reviewing this information with the patient's wife, she is  unsure.  I will try calling his daughter Hanley Ben, NP 05/10/2020 10:54 AM  Sx onset 10/1

## 2020-05-11 ENCOUNTER — Ambulatory Visit (HOSPITAL_COMMUNITY): Payer: Medicare Other

## 2020-05-11 ENCOUNTER — Ambulatory Visit (HOSPITAL_COMMUNITY)
Admission: RE | Admit: 2020-05-11 | Discharge: 2020-05-11 | Disposition: A | Discharge status: Home / Self Care | Payer: Medicare Other | Source: Ambulatory Visit | Attending: Pulmonary Disease | Admitting: Pulmonary Disease

## 2020-05-11 DIAGNOSIS — U071 COVID-19: Secondary | ICD-10-CM | POA: Diagnosis not present

## 2020-05-11 DIAGNOSIS — Z23 Encounter for immunization: Secondary | ICD-10-CM | POA: Diagnosis not present

## 2020-05-11 MED ORDER — SODIUM CHLORIDE 0.9 % IV SOLN: Freq: Once | INTRAVENOUS | Status: AC

## 2020-05-11 MED ORDER — METHYLPREDNISOLONE SODIUM SUCC 125 MG IJ SOLR: 125.0000 mg | Freq: Once | INTRAMUSCULAR | Status: DC | PRN

## 2020-05-11 MED ORDER — DIPHENHYDRAMINE HCL 50 MG/ML IJ SOLN: 50.0000 mg | Freq: Once | INTRAMUSCULAR | Status: DC | PRN

## 2020-05-11 MED ORDER — ALBUTEROL SULFATE HFA 108 (90 BASE) MCG/ACT IN AERS: 2.0000 | INHALATION_SPRAY | Freq: Once | RESPIRATORY_TRACT | Status: DC | PRN

## 2020-05-11 MED ORDER — FAMOTIDINE IN NACL 20-0.9 MG/50ML-% IV SOLN: 20.0000 mg | Freq: Once | INTRAVENOUS | Status: DC | PRN

## 2020-05-11 MED ORDER — EPINEPHRINE 0.3 MG/0.3ML IJ SOAJ: 0.3000 mg | Freq: Once | INTRAMUSCULAR | Status: DC | PRN

## 2020-05-11 MED ORDER — SODIUM CHLORIDE 0.9 % IV SOLN: INTRAVENOUS | Status: DC | PRN

## 2020-05-11 NOTE — Discharge Instructions (Signed)

## 2020-05-11 NOTE — Progress Notes (Signed)
  Diagnosis: COVID-19  Physician: Dr Wright  Procedure: Covid Infusion Clinic Med: bamlanivimab\etesevimab infusion - Provided patient with bamlanimivab\etesevimab fact sheet for patients, parents and caregivers prior to infusion.    Complications: No immediate complications noted.  Discharge: Discharged home   Kurt Bailey 05/11/2020   

## 2020-05-13 ENCOUNTER — Emergency Department: Payer: Medicare Other

## 2020-05-13 ENCOUNTER — Telehealth: Payer: Self-pay

## 2020-05-13 ENCOUNTER — Inpatient Hospital Stay
Admission: EM | Admit: 2020-05-13 | Discharge: 2020-05-16 | DRG: 177 | Disposition: A | Discharge status: Home Health | Payer: Medicare Other | Attending: Family Medicine | Admitting: Family Medicine

## 2020-05-13 ENCOUNTER — Encounter: Payer: Self-pay | Admitting: Radiology

## 2020-05-13 ENCOUNTER — Other Ambulatory Visit: Payer: Self-pay

## 2020-05-13 DIAGNOSIS — Z6831 Body mass index (BMI) 31.0-31.9, adult: Secondary | ICD-10-CM

## 2020-05-13 DIAGNOSIS — Z823 Family history of stroke: Secondary | ICD-10-CM | POA: Diagnosis not present

## 2020-05-13 DIAGNOSIS — E785 Hyperlipidemia, unspecified: Secondary | ICD-10-CM | POA: Diagnosis present

## 2020-05-13 DIAGNOSIS — U071 COVID-19: Principal | ICD-10-CM | POA: Diagnosis present

## 2020-05-13 DIAGNOSIS — R0602 Shortness of breath: Secondary | ICD-10-CM | POA: Diagnosis not present

## 2020-05-13 DIAGNOSIS — Z803 Family history of malignant neoplasm of breast: Secondary | ICD-10-CM | POA: Diagnosis not present

## 2020-05-13 DIAGNOSIS — E876 Hypokalemia: Secondary | ICD-10-CM | POA: Diagnosis present

## 2020-05-13 DIAGNOSIS — Z79899 Other long term (current) drug therapy: Secondary | ICD-10-CM | POA: Diagnosis not present

## 2020-05-13 DIAGNOSIS — F0391 Unspecified dementia with behavioral disturbance: Secondary | ICD-10-CM | POA: Diagnosis present

## 2020-05-13 DIAGNOSIS — E669 Obesity, unspecified: Secondary | ICD-10-CM | POA: Diagnosis present

## 2020-05-13 DIAGNOSIS — Z811 Family history of alcohol abuse and dependence: Secondary | ICD-10-CM | POA: Diagnosis not present

## 2020-05-13 DIAGNOSIS — J9601 Acute respiratory failure with hypoxia: Secondary | ICD-10-CM

## 2020-05-13 DIAGNOSIS — Z825 Family history of asthma and other chronic lower respiratory diseases: Secondary | ICD-10-CM

## 2020-05-13 DIAGNOSIS — J90 Pleural effusion, not elsewhere classified: Secondary | ICD-10-CM | POA: Diagnosis not present

## 2020-05-13 DIAGNOSIS — R509 Fever, unspecified: Secondary | ICD-10-CM | POA: Diagnosis not present

## 2020-05-13 DIAGNOSIS — Z83438 Family history of other disorder of lipoprotein metabolism and other lipidemia: Secondary | ICD-10-CM | POA: Diagnosis not present

## 2020-05-13 DIAGNOSIS — Z8249 Family history of ischemic heart disease and other diseases of the circulatory system: Secondary | ICD-10-CM | POA: Diagnosis not present

## 2020-05-13 DIAGNOSIS — J189 Pneumonia, unspecified organism: Secondary | ICD-10-CM | POA: Diagnosis not present

## 2020-05-13 DIAGNOSIS — J1282 Pneumonia due to coronavirus disease 2019: Secondary | ICD-10-CM | POA: Diagnosis present

## 2020-05-13 HISTORY — DX: Acute respiratory failure with hypoxia: J96.01

## 2020-05-13 HISTORY — DX: COVID-19: U07.1

## 2020-05-13 LAB — CBC
HCT: 44.1 % (ref 39.0–52.0)
Hemoglobin: 15.6 g/dL (ref 13.0–17.0)
MCH: 35.1 pg — ABNORMAL HIGH (ref 26.0–34.0)
MCHC: 35.4 g/dL (ref 30.0–36.0)
MCV: 99.1 fL (ref 80.0–100.0)
Platelets: 258 10*3/uL (ref 150–400)
RBC: 4.45 MIL/uL (ref 4.22–5.81)
RDW: 12.5 % (ref 11.5–15.5)
WBC: 5.6 10*3/uL (ref 4.0–10.5)
nRBC: 0 % (ref 0.0–0.2)

## 2020-05-13 LAB — COMPREHENSIVE METABOLIC PANEL
ALT: 41 U/L (ref 0–44)
AST: 57 U/L — ABNORMAL HIGH (ref 15–41)
Albumin: 3.1 g/dL — ABNORMAL LOW (ref 3.5–5.0)
Alkaline Phosphatase: 47 U/L (ref 38–126)
Anion gap: 12 (ref 5–15)
BUN: 14 mg/dL (ref 8–23)
CO2: 27 mmol/L (ref 22–32)
Calcium: 8.4 mg/dL — ABNORMAL LOW (ref 8.9–10.3)
Chloride: 99 mmol/L (ref 98–111)
Creatinine, Ser: 1.02 mg/dL (ref 0.61–1.24)
GFR, Estimated: >60 mL/min (ref >60)
Glucose, Bld: 127 mg/dL — ABNORMAL HIGH (ref 70–99)
Potassium: 3.3 mmol/L — ABNORMAL LOW (ref 3.5–5.1)
Sodium: 138 mmol/L (ref 135–145)
Total Bilirubin: 0.9 mg/dL (ref 0.3–1.2)
Total Protein: 7.6 g/dL (ref 6.5–8.1)

## 2020-05-13 LAB — LACTIC ACID, PLASMA
Lactic Acid, Venous: 2.1 mmol/L (ref 0.5–1.9)
Lactic Acid, Venous: 1.9 mmol/L (ref 0.5–1.9)

## 2020-05-13 LAB — PROCALCITONIN: Procalcitonin: <0.1 ng/mL

## 2020-05-13 LAB — C-REACTIVE PROTEIN: CRP: 10.3 mg/dL — ABNORMAL HIGH (ref <1.0)

## 2020-05-13 LAB — FIBRIN DERIVATIVES D-DIMER (ARMC ONLY): Fibrin derivatives D-dimer (ARMC): 1813.34 ng/mL (FEU) — ABNORMAL HIGH (ref 0.00–499.00)

## 2020-05-13 MED ORDER — METHYLPREDNISOLONE SODIUM SUCC 125 MG IJ SOLR
100.0000 mg | Freq: Once | INTRAMUSCULAR | Status: AC
  Administered 2020-05-13: 125 mg via INTRAVENOUS
  Filled 2020-05-13: qty 2

## 2020-05-13 MED ORDER — SODIUM CHLORIDE 0.9 % IV SOLN
200.0000 mg | Freq: Once | INTRAVENOUS | Status: AC
  Administered 2020-05-13: 200 mg via INTRAVENOUS
  Filled 2020-05-13: qty 40

## 2020-05-13 MED ORDER — SODIUM CHLORIDE 0.9 % IV BOLUS
1000.0000 mL | Freq: Once | INTRAVENOUS | Status: AC
  Administered 2020-05-13: 1000 mL via INTRAVENOUS

## 2020-05-13 MED ORDER — SODIUM CHLORIDE 0.9 % IV SOLN
100.0000 mg | Freq: Every day | INTRAVENOUS | Status: DC
  Administered 2020-05-14 – 2020-05-16 (×3): 100 mg via INTRAVENOUS
  Filled 2020-05-13 (×4): qty 20

## 2020-05-13 MED ORDER — IOHEXOL 350 MG/ML SOLN
75.0000 mL | Freq: Once | INTRAVENOUS | Status: AC | PRN
  Administered 2020-05-13: 75 mL via INTRAVENOUS

## 2020-05-13 NOTE — Consult Note (Signed)
Remdesivir - Pharmacy Brief Note     A/P:  Per notes, patient was dx with COVID yesterday at [facility].   Remdesivir 200 mg IVPB once followed by 100 mg IVPB daily x 4 days.   Cephus Shelling, PharmD Clinical Pharmacist   05/13/2020 9:07 PM

## 2020-05-13 NOTE — Telephone Encounter (Signed)
I left a message for the daughter to give me a call.

## 2020-05-13 NOTE — ED Provider Notes (Signed)
[facility] Emergency Department Provider Note  ____________________________________________   First MD Initiated Contact with Patient 05/13/20 2044     (approximate)  I have reviewed the triage vital signs and the nursing notes.   HISTORY  Chief Complaint Fever and Covid Exposure    HPI Kurt Bailey is a 74 y.o. male    here with cough, shortness of breath, fatigue.  The patient was first diagnosed with Covid at [facility] clinic on 10/2.  He reportedly has had persistent fever, chills, and fatigue since then.  His son and wife were both positive.  He states that over the last 24 hours, he has felt progressively worse, and has now developed worsening shortness of breath.  He has been trying to hydrate and take care of himself at home, but felt like he could no longer do that safely.  He has been intermittently calling his PCP who is been monitoring his symptoms.  Denies any pain.  No nausea.  He has had some loose stools, which are somewhat improving.  He has had intermittent fevers that have persisted since onset.  No specific alleviating factors.       Past Medical History:  Diagnosis Date  . Alcohol abuse   . Diverticulitis   . Periumbilical hernia     Patient Active Problem List   Diagnosis Date Noted  . Hyperlipidemia 07/04/2019  . Vitamin B 12 deficiency 09/20/2018  . Memory changes 09/19/2018    No past surgical history on file.  Prior to Admission medications   Medication Sig Start Date End Date Taking? Authorizing Provider  atorvastatin (LIPITOR) 40 MG tablet Take 1 tablet (40 mg total) by mouth every evening. For cholesterol. 07/05/19   Doreene Nest, NP  divalproex (DEPAKOTE) 125 MG DR tablet Take 125 mg by mouth 2 (two) times daily. 03/05/19   [provider]  memantine (NAMENDA) 5 MG tablet Take by mouth. 03/26/19 03/25/20  [provider]  Omega-3 1000 MG CAPS Take by mouth.    [provider]  vitamin  B-12 (CYANOCOBALAMIN) 1000 MCG tablet Take by mouth.    [provider]    Allergies Patient has no known allergies.  Family History  Problem Relation Age of Onset  . Alcohol abuse Father   . Stroke Father   . Hypertension Father   . Emphysema Father   . Breast cancer Sister   . Heart attack Brother   . Aneurysm Sister   . Stroke Brother   . Hyperlipidemia Brother     Social History Social History   Tobacco Use  . Smoking status: Never Smoker  . Smokeless tobacco: Never Used  Substance Use Topics  . Alcohol use: Not Currently  . Drug use: Not on file    Review of Systems  Review of Systems  Constitutional: Positive for chills, fatigue and fever.  HENT: Negative for sore throat.   Respiratory: Positive for cough and shortness of breath.   Cardiovascular: Negative for chest pain.  Gastrointestinal: Positive for nausea and vomiting. Negative for abdominal pain.  Genitourinary: Negative for flank pain.  Musculoskeletal: Negative for neck pain.  Skin: Negative for rash and wound.  Allergic/Immunologic: Negative for immunocompromised state.  Neurological: Positive for weakness. Negative for numbness.  Hematological: Does not bruise/bleed easily.  All other systems reviewed and are negative.    ____________________________________________  PHYSICAL EXAM:      VITAL SIGNS: ED Triage Vitals [05/13/20 1907]  Enc Vitals Group  BP 113/75     Pulse Rate (!) 103     Resp 20     Temp 100 F (37.8 C)     Temp Source Oral     SpO2 (!) 89 %     Weight 210 lb (95.3 kg)     Height 5\' 8"  (1.727 m)     Head Circumference      Peak Flow      Pain Score 0     Pain Loc      Pain Edu?      Excl. in GC?      Physical Exam Vitals and nursing note reviewed.  Constitutional:      General: He is not in acute distress.    Appearance: He is well-developed.  HENT:     Head: Normocephalic and atraumatic.     Mouth/Throat:     Mouth: Mucous membranes are dry.    Eyes:     Conjunctiva/sclera: Conjunctivae normal.  Cardiovascular:     Rate and Rhythm: Regular rhythm. Tachycardia present.     Heart sounds: Normal heart sounds. No murmur heard.  No friction rub.  Pulmonary:     Effort: Tachypnea present.     Breath sounds: Rales present. No wheezing.  Abdominal:     General: There is no distension.     Palpations: Abdomen is soft.     Tenderness: There is no abdominal tenderness.  Musculoskeletal:     Cervical back: Neck supple.  Skin:    General: Skin is warm.     Capillary Refill: Capillary refill takes less than 2 seconds.  Neurological:     Mental Status: He is alert and oriented to person, place, and time.     Motor: No abnormal muscle tone.       ____________________________________________   LABS (all labs ordered are listed, but only abnormal results are displayed)  Labs Reviewed  CBC - Abnormal; Notable for the following components:      Result Value   MCH 35.1 (*)    All other components within normal limits  COMPREHENSIVE METABOLIC PANEL - Abnormal; Notable for the following components:   Potassium 3.3 (*)    Glucose, Bld 127 (*)    Calcium 8.4 (*)    Albumin 3.1 (*)    AST 57 (*)    All other components within normal limits  LACTIC ACID, PLASMA - Abnormal; Notable for the following components:   Lactic Acid, Venous 2.1 (*)    All other components within normal limits  RESPIRATORY PANEL BY RT PCR (FLU A&B, COVID)  CULTURE, BLOOD (SINGLE)  LACTIC ACID, PLASMA  PROCALCITONIN  C-REACTIVE PROTEIN  FIBRIN DERIVATIVES D-DIMER ([facility] ONLY)    ____________________________________________   ________________________________________  RADIOLOGY All imaging, including plain films, CT scans, and ultrasounds, independently reviewed by me, and interpretations confirmed via formal radiology reads.  ED MD interpretation:   CXR: Multifocal PNA  Official radiology report(s): DG Chest 2 View  Result Date:  05/13/2020 CLINICAL DATA:  Fever, COVID positive EXAM: CHEST - 2 VIEW COMPARISON:  03/13/2014 FINDINGS: Lung volumes are small, however, pulmonary insufflation is symmetric and stable since prior examination. There has developed moderate bilateral peripheral mid and lower lung zone predominant airspace infiltrates, likely infectious or inflammatory in the acute setting and compatible with the given history of COVID pneumonia. No pneumothorax or pleural effusion. Cardiac size within normal limits. Pulmonary vascularity is normal. No acute bone abnormality. IMPRESSION: Multifocal pulmonary infiltrates, likely infectious or inflammatory  in nature. Electronically Signed   By: Helyn NumbersAshesh  Parikh MD   On: 05/13/2020 19:37    ____________________________________________  PROCEDURES   Procedure(s) performed (including Critical Care):  .Critical Care Performed by: Shaune PollackIsaacs, Neev Mcmains, MD Authorized by: Shaune PollackIsaacs, Darryon Bastin, MD   Critical care provider statement:    Critical care time (minutes):  35   Critical care time was exclusive of:  Separately billable procedures and treating other patients and teaching time   Critical care was necessary to treat or prevent imminent or life-threatening deterioration of the following conditions:  Circulatory failure, cardiac failure and respiratory failure   Critical care was time spent personally by me on the following activities:  Development of treatment plan with patient or surrogate, discussions with consultants, evaluation of patient's response to treatment, examination of patient, obtaining history from patient or surrogate, ordering and performing treatments and interventions, ordering and review of laboratory studies, ordering and review of radiographic studies, pulse oximetry, re-evaluation of patient's condition and review of old charts   I assumed direction of critical care for this patient from another provider in my specialty: no       ____________________________________________  INITIAL IMPRESSION / MDM / ASSESSMENT AND PLAN / ED COURSE  As part of my medical decision making, I reviewed the following data within the electronic MEDICAL RECORD NUMBER Nursing notes reviewed and incorporated, Old chart reviewed, Notes from prior ED visits, and [place] Controlled Substance Database       *Kurt Bailey was evaluated in Emergency Department on 05/13/2020 for the symptoms described in the history of present illness. He was evaluated in the context of the global COVID-19 pandemic, which necessitated consideration that the patient might be at risk for infection with the SARS-CoV-2 virus that causes COVID-19. Institutional protocols and algorithms that pertain to the evaluation of patients at risk for COVID-19 are in a state of rapid change based on information released by regulatory bodies including the CDC and federal and state organizations. These policies and algorithms were followed during the patient's care in the ED.  Some ED evaluations and interventions may be delayed as a result of limited staffing during the pandemic.*     Medical Decision Making: 74 year old male here with acute hypoxic respiratory failure due to COVID-19.  Reviewed his previous labs, as well as PCP notes.  He appears to be progressively worsening at greater than 1 week after symptoms, now with hypoxia and sats of 85 at home per records.  Patient improved with 2 L nasal cannula here.  Chest x-ray shows multifocal pneumonia.  Lab work is consistent with dehydration and infection with mild lactic acidosis, will follow-up procalcitonin.  He is not appear septic.  Will plan to admit for steroids, remdesivir, and further work-up.  Of note, he did receive an outpatient antibody infusion.  He is not vaccinated.  ____________________________________________  FINAL CLINICAL IMPRESSION(S) / ED DIAGNOSES  Final diagnoses:  Acute hypoxemic respiratory failure due to  COVID-19 [facility](HCC)     MEDICATIONS GIVEN DURING THIS VISIT:  Medications  remdesivir 200 mg in sodium chloride 0.9% 250 mL IVPB (has no administration in time range)    Followed by  remdesivir 100 mg in sodium chloride 0.9 % 100 mL IVPB (has no administration in time range)  sodium chloride 0.9 % bolus 1,000 mL (1,000 mLs Intravenous New Bag/Given 05/13/20 2118)  methylPREDNISolone sodium succinate (SOLU-MEDROL) 125 mg/2 mL injection 100 mg (100 mg Intravenous Given 05/13/20 2120)     ED Discharge Orders  None       Note:  This document was prepared using Dragon voice recognition software and may include unintentional dictation errors.   Shaune Pollack, MD 05/13/20 2125

## 2020-05-13 NOTE — Telephone Encounter (Signed)
Noted  

## 2020-05-13 NOTE — Telephone Encounter (Signed)
Called and spoke to wife. Daughter is going to come back by after 5 and take him. Advised if he starts having any increased issues with sob to call 911. She repeated instructions back to me and will call if any questions.

## 2020-05-13 NOTE — ED Triage Notes (Signed)
Pt in with co fever was dx at [facility] yesterday with Covid and is here for persistent fever. Pt denies any shob or chest pain. No distress noted in triage.

## 2020-05-13 NOTE — Telephone Encounter (Signed)
Agree. If he's experiencing dyspnea with an oxygen saturation of 85%, then needs to be seen in the ED.

## 2020-05-13 NOTE — H&P (Addendum)
[place]   PATIENT NAME: Kurt Bailey    MR#:  [id number]  DATE OF BIRTH:  02-22-1946  DATE OF ADMISSION:  05/13/2020  PRIMARY CARE PHYSICIAN: Doreene Nest, NP   REQUESTING/REFERRING PHYSICIAN: Shaune Pollack, MD  CHIEF COMPLAINT:   Chief Complaint  Patient presents with  . Fever  . Covid Exposure    HISTORY OF PRESENT ILLNESS:  Kurt Bailey  is a 74 y.o. Caucasian male with a known history of diverticulitis and alcohol abuse and recently diagnosed COVID-19 at [facility] clinic a few days ago.  The patient started having loss of taste and smell about 10 days ago and this was followed by a dry cough without wheezing or dyspnea then diarrhea later on.  She has been having intermittent fever and chills as well as generalized weakness and body aches.  No dysuria, oliguria or hematuria or flank pain.  No chest pain or palpitations.  He did against COVID-19 and had a recent COVID-19 exposure.  Upon presentation to the emergency room, temperature was 100.9 later one 1.5 with heart rate of 106 and pulse symmetry was 89% on room air and that came up to 95% on 2 L of O2 by nasal cannula.  Labs revealed mild hypokalemia and albumin of 3.1 with a AST of 57 otherwise unremarkable CMP.  CRP was 10.3 and lactic acid 2.1 and later 1.9 with procalcitonin less than 0.1.  CBC was unremarkable and fibrin derivatives D-dimer was 1813.34.  Chest CTA revealed no evidence for PE.  Showed bilateral lung changes consistent with COVID-19 pneumonia. EKG showed sinus rhythm with a rate of 79  The patient was given 1 L bolus of IV normal saline as well as IV Solu-Medrol and remdesivir.  He will be admitted to a medically monitored bed for further evaluation and management. PAST MEDICAL HISTORY:   Past Medical History:  Diagnosis Date  . Alcohol abuse   . Diverticulitis   . Periumbilical hernia     PAST SURGICAL HISTORY:  No past surgical history on file. He denies any previous  surgeries. SOCIAL HISTORY:   Social History   Tobacco Use  . Smoking status: Never Smoker  . Smokeless tobacco: Never Used  Substance Use Topics  . Alcohol use: Not Currently    FAMILY HISTORY:   Family History  Problem Relation Age of Onset  . Alcohol abuse Father   . Stroke Father   . Hypertension Father   . Emphysema Father   . Breast cancer Sister   . Heart attack Brother   . Aneurysm Sister   . Stroke Brother   . Hyperlipidemia Brother     DRUG ALLERGIES:  No Known Allergies  REVIEW OF SYSTEMS:   ROS As per history of present illness. All pertinent systems were reviewed above. Constitutional, HEENT, cardiovascular, respiratory, GI, GU, musculoskeletal, neuro, psychiatric, endocrine, integumentary and hematologic systems were reviewed and are otherwise negative/unremarkable except for positive findings mentioned above in the HPI.   MEDICATIONS AT HOME:   Prior to Admission medications   Medication Sig Start Date End Date Taking? Authorizing Provider  acetaminophen (TYLENOL) 500 MG tablet Take 500 mg by mouth every 6 (six) hours as needed.   Yes [provider]  atorvastatin (LIPITOR) 40 MG tablet Take 1 tablet (40 mg total) by mouth every evening. For cholesterol. 07/05/19  Yes Doreene Nest, NP  benzonatate (TESSALON) 200 MG capsule Take 200 mg by mouth 3 (three) times daily as needed. 05/05/20  Yes [provider]  divalproex (DEPAKOTE) 250 MG DR tablet Take 125 mg by mouth 2 (two) times daily.  03/05/19  Yes [provider]  memantine (NAMENDA) 10 MG tablet Take 10 mg by mouth 2 (two) times daily.  03/26/19 05/13/20 Yes [provider]  Omega-3 1000 MG CAPS Take 1,000 mg by mouth every evening.    Yes [provider]  vitamin B-12 (CYANOCOBALAMIN) 1000 MCG tablet Take 1,000 mcg by mouth every evening.    Yes [provider]      VITAL SIGNS:  Blood pressure (!) 125/92, pulse 78, temperature 98.6 F (37  C), temperature source Oral, resp. rate (!) 22, height 5\' 8"  (1.727 m), weight 95.3 kg, SpO2 95 %.  PHYSICAL EXAMINATION:  Physical Exam  GENERAL:  74 y.o.-year-old Caucasian male patient lying in the bed with mild respiratory distress with conversational dyspnea.65  EYES: Pupils equal, round, reactive to light and accommodation. No scleral icterus. Extraocular muscles intact.  HEENT: Head atraumatic, normocephalic. Oropharynx and nasopharynx clear.  NECK:  Supple, no jugular venous distention. No thyroid enlargement, no tenderness.  LUNGS: Diminished bibasal breath sounds with bibasal crackles. CARDIOVASCULAR: Regular rate and rhythm, S1, S2 normal. No murmurs, rubs, or gallops.  ABDOMEN: Soft, nondistended, nontender. Bowel sounds present. No organomegaly or mass.  EXTREMITIES: No pedal edema, cyanosis, or clubbing.  NEUROLOGIC: Cranial nerves II through XII are intact. Muscle strength 5/5 in all extremities. Sensation intact. Gait not checked.  PSYCHIATRIC: The patient is alert and oriented x 3.  Normal affect and good eye contact. SKIN: No obvious rash, lesion, or ulcer.   LABORATORY PANEL:   CBC Recent Labs  Lab 05/13/20 1908  WBC 5.6  HGB 15.6  HCT 44.1  PLT 258   ------------------------------------------------------------------------------------------------------------------  Chemistries  Recent Labs  Lab 05/13/20 1908  NA 138  K 3.3*  CL 99  CO2 27  GLUCOSE 127*  BUN 14  CREATININE 1.02  CALCIUM 8.4*  AST 57*  ALT 41  ALKPHOS 47  BILITOT 0.9   ------------------------------------------------------------------------------------------------------------------  Cardiac Enzymes No results for input(s): TROPONINI in the last 168 hours. ------------------------------------------------------------------------------------------------------------------  RADIOLOGY:  DG Chest 2 View  Result Date: 05/13/2020 CLINICAL DATA:  Fever, COVID positive EXAM: CHEST - 2  VIEW COMPARISON:  03/13/2014 FINDINGS: Lung volumes are small, however, pulmonary insufflation is symmetric and stable since prior examination. There has developed moderate bilateral peripheral mid and lower lung zone predominant airspace infiltrates, likely infectious or inflammatory in the acute setting and compatible with the given history of COVID pneumonia. No pneumothorax or pleural effusion. Cardiac size within normal limits. Pulmonary vascularity is normal. No acute bone abnormality. IMPRESSION: Multifocal pulmonary infiltrates, likely infectious or inflammatory in nature. Electronically Signed   By: 05/13/2014 MD   On: 05/13/2020 19:37   CT Angio Chest PE W and/or Wo Contrast  Result Date: 05/13/2020 CLINICAL DATA:  History of COVID-19 positivity with worsening shortness of breath and hypoxia EXAM: CT ANGIOGRAPHY CHEST WITH CONTRAST TECHNIQUE: Multidetector CT imaging of the chest was performed using the standard protocol during bolus administration of intravenous contrast. Multiplanar CT image reconstructions and MIPs were obtained to evaluate the vascular anatomy. CONTRAST:  60mL OMNIPAQUE IOHEXOL 350 MG/ML SOLN COMPARISON:  Chest x-ray from earlier in the same day. FINDINGS: Cardiovascular: Thoracic aorta is well visualize without aneurysmal dilatation or dissection. No significant cardiac enlargement is noted. No pericardial effusion is seen. No significant coronary calcifications are noted. The pulmonary artery shows a normal branching  pattern without intraluminal filling defect to suggest pulmonary embolism. Mediastinum/Nodes: Thoracic inlet is within normal limits. Scattered small mediastinal and hilar lymph nodes are noted likely reactive in nature. The esophagus is within normal limits as visualized. Lungs/Pleura: Lungs are well aerated bilaterally with peripheral ground-glass opacities throughout both lungs consistent with the given clinical history of COVID-19 positivity. Upper Abdomen:  Visualized upper abdomen is unremarkable. Musculoskeletal: No chest wall abnormality. No acute or significant osseous findings. Review of the MIP images confirms the above findings. IMPRESSION: No evidence of pulmonary emboli. Changes in the lungs bilaterally consistent with the given clinical history of COVID-19 positivity. Electronically Signed   By: Alcide Clever M.D.   On: 05/13/2020 23:02      IMPRESSION AND PLAN:   1.  Acute hypoxemic respiratory failure secondary to COVID-19. -The patient will be admitted to a medically monitored isolation bed. -O2 protocol will be followed to keep O2 saturation above 93.   2.  Multifocal pneumonia secondary to COVID-19. -The patient will be admitted to an isolation monitored bed with droplet and contact precautions. -Given multifocal pneumonia we will empirically place the patient on IV Rocephin and Zithromax for possible bacterial superinfection only with elevated Procalcitonin. -The patient will be placed on scheduled Mucinex and as needed Tussionex. -We will avoid nebulization as much as we can, give bronchodilator MDI if needed, and with deterioration of oxygenation try to avoid BiPAP/CPAP if possible.    -Will obtain sputum Gram stain culture and sensitivity and follow blood cultures. -O2 protocol will be followed. -We will follow CRP, ferritin, LDH and D-dimer. -Will follow manual differential for ANC/ALC ratio as well as follow troponin I and daily CBC with manual differential and CMP. - Will place the patient on IV Remdesivir and IV steroid therapy with IV Solu-Medrol with elevated inflammatory markers. -The patient will be placed on vitamin D3, vitamin C, zinc sulfate, p.o. Pepcid and aspirin. -I discussed Baricitinib and the patient agreed to proceed with it.  3.  Hypokalemia. -Potassium will be replaced and magnesium level will be checked.  4.  Dyslipidemia. -We will continue statin therapy.  5.  Dementia with behavioral changes. -We  will continue Namenda and Depakote.  6.  DVT prophylaxis. -Subcutaneous Lovenox.   All the records are reviewed and case discussed with ED provider. The plan of care was discussed in details with the patient (and family). I answered all questions. The patient agreed to proceed with the above mentioned plan. Further management will depend upon hospital course.   CODE STATUS: Full code  Status is: Inpatient  Remains inpatient appropriate because:Hemodynamically unstable, Ongoing diagnostic testing needed not appropriate for outpatient work up, Unsafe d/c plan, IV treatments appropriate due to intensity of illness or inability to take PO and Inpatient level of care appropriate due to severity of illness   Dispo: The patient is from: Home              Anticipated d/c is to: Home              Anticipated d/c date is: 3 days              Patient currently is not medically stable to d/c.      TOTAL TIME TAKING CARE OF THIS PATIENT: 555 minutes.    Hannah Beat M.D on 05/13/2020 at 11:58 PM  [facility]   From 7 PM-7 AM, contact night-coverage [email or web address]  CC: Primary care physician; Doreene Nest, NP

## 2020-05-13 NOTE — ED Notes (Signed)
Pt placed on o2 at 2l per [place].  

## 2020-05-13 NOTE — Telephone Encounter (Signed)
Pt's wife called to say pt received his infusion 2 days ago and is still having a hard time with Covid. Coughing stuff up. Not eating. Drinking Gatorade. Said her daughter checked his O2 sats and it is 19. I advised her that if his O2 is below 90 he needs it go to the ER.

## 2020-05-14 ENCOUNTER — Encounter: Payer: Self-pay | Admitting: Family Medicine

## 2020-05-14 DIAGNOSIS — U071 COVID-19: Secondary | ICD-10-CM | POA: Diagnosis not present

## 2020-05-14 DIAGNOSIS — J9601 Acute respiratory failure with hypoxia: Secondary | ICD-10-CM | POA: Diagnosis not present

## 2020-05-14 LAB — MAGNESIUM: Magnesium: 2.4 mg/dL (ref 1.7–2.4)

## 2020-05-14 LAB — FERRITIN: Ferritin: 499 ng/mL — ABNORMAL HIGH (ref 24–336)

## 2020-05-14 LAB — LACTATE DEHYDROGENASE: LDH: 242 U/L — ABNORMAL HIGH (ref 98–192)

## 2020-05-14 MED ORDER — ONDANSETRON HCL 4 MG PO TABS: 4.0000 mg | ORAL_TABLET | Freq: Four times a day (QID) | ORAL | Status: DC | PRN

## 2020-05-14 MED ORDER — PREDNISONE 50 MG PO TABS: 50.0000 mg | ORAL_TABLET | Freq: Every day | ORAL | Status: DC

## 2020-05-14 MED ORDER — METHYLPREDNISOLONE SODIUM SUCC 125 MG IJ SOLR
1.0000 mg/kg | Freq: Two times a day (BID) | INTRAMUSCULAR | Status: DC
  Administered 2020-05-14 – 2020-05-16 (×5): 95 mg via INTRAVENOUS
  Filled 2020-05-14 (×5): qty 2

## 2020-05-14 MED ORDER — TRAZODONE HCL 50 MG PO TABS: 25.0000 mg | ORAL_TABLET | Freq: Every evening | ORAL | Status: DC | PRN

## 2020-05-14 MED ORDER — ZINC SULFATE 220 (50 ZN) MG PO CAPS
220.0000 mg | ORAL_CAPSULE | Freq: Every day | ORAL | Status: DC
  Administered 2020-05-14 – 2020-05-16 (×3): 220 mg via ORAL
  Filled 2020-05-14 (×3): qty 1

## 2020-05-14 MED ORDER — MAGNESIUM HYDROXIDE 400 MG/5ML PO SUSP
30.0000 mL | Freq: Every day | ORAL | Status: DC | PRN
  Filled 2020-05-14: qty 30

## 2020-05-14 MED ORDER — HYDROCOD POLST-CPM POLST ER 10-8 MG/5ML PO SUER: 5.0000 mL | Freq: Two times a day (BID) | ORAL | Status: DC | PRN

## 2020-05-14 MED ORDER — ONDANSETRON HCL 4 MG/2ML IJ SOLN: 4.0000 mg | Freq: Four times a day (QID) | INTRAMUSCULAR | Status: DC | PRN

## 2020-05-14 MED ORDER — ACETAMINOPHEN 325 MG PO TABS: 650.0000 mg | ORAL_TABLET | Freq: Four times a day (QID) | ORAL | Status: DC | PRN

## 2020-05-14 MED ORDER — OMEGA-3-ACID ETHYL ESTERS 1 G PO CAPS
1000.0000 mg | ORAL_CAPSULE | Freq: Every evening | ORAL | Status: DC
  Administered 2020-05-14 – 2020-05-15 (×2): 1000 mg via ORAL
  Filled 2020-05-14 (×2): qty 1

## 2020-05-14 MED ORDER — ATORVASTATIN CALCIUM 20 MG PO TABS
40.0000 mg | ORAL_TABLET | Freq: Every evening | ORAL | Status: DC
  Administered 2020-05-14 – 2020-05-15 (×2): 40 mg via ORAL
  Filled 2020-05-14 (×2): qty 2

## 2020-05-14 MED ORDER — BARICITINIB 2 MG PO TABS
4.0000 mg | ORAL_TABLET | Freq: Every day | ORAL | Status: DC
  Administered 2020-05-14: 4 mg via ORAL
  Filled 2020-05-14 (×2): qty 2

## 2020-05-14 MED ORDER — VITAMIN B-12 1000 MCG PO TABS
1000.0000 ug | ORAL_TABLET | Freq: Every evening | ORAL | Status: DC
  Administered 2020-05-14 – 2020-05-15 (×2): 1000 ug via ORAL
  Filled 2020-05-14 (×2): qty 1

## 2020-05-14 MED ORDER — GUAIFENESIN-DM 100-10 MG/5ML PO SYRP
10.0000 mL | ORAL_SOLUTION | ORAL | Status: DC | PRN
  Filled 2020-05-14: qty 10

## 2020-05-14 MED ORDER — FAMOTIDINE 20 MG PO TABS
20.0000 mg | ORAL_TABLET | Freq: Two times a day (BID) | ORAL | Status: DC
  Administered 2020-05-14 – 2020-05-16 (×5): 20 mg via ORAL
  Filled 2020-05-14 (×6): qty 1

## 2020-05-14 MED ORDER — MEMANTINE HCL 5 MG PO TABS
10.0000 mg | ORAL_TABLET | Freq: Two times a day (BID) | ORAL | Status: DC
  Administered 2020-05-14 – 2020-05-16 (×5): 10 mg via ORAL
  Filled 2020-05-14 (×6): qty 2

## 2020-05-14 MED ORDER — ASPIRIN EC 81 MG PO TBEC
81.0000 mg | DELAYED_RELEASE_TABLET | Freq: Every day | ORAL | Status: DC
  Administered 2020-05-14 – 2020-05-16 (×3): 81 mg via ORAL
  Filled 2020-05-14 (×3): qty 1

## 2020-05-14 MED ORDER — POTASSIUM CHLORIDE 20 MEQ PO PACK
40.0000 meq | PACK | Freq: Once | ORAL | Status: AC
  Administered 2020-05-14: 06:00:00 40 meq via ORAL
  Filled 2020-05-14: qty 2

## 2020-05-14 MED ORDER — DIVALPROEX SODIUM 125 MG PO DR TAB
125.0000 mg | DELAYED_RELEASE_TABLET | Freq: Two times a day (BID) | ORAL | Status: DC
  Administered 2020-05-14 – 2020-05-16 (×5): 125 mg via ORAL
  Filled 2020-05-14 (×6): qty 1

## 2020-05-14 MED ORDER — ORAL CARE MOUTH RINSE
15.0000 mL | Freq: Two times a day (BID) | OROMUCOSAL | Status: DC
  Administered 2020-05-14 – 2020-05-16 (×5): 15 mL via OROMUCOSAL

## 2020-05-14 MED ORDER — ENOXAPARIN SODIUM 40 MG/0.4ML ~~LOC~~ SOLN
40.0000 mg | SUBCUTANEOUS | Status: DC
  Administered 2020-05-14 – 2020-05-16 (×3): 40 mg via SUBCUTANEOUS
  Filled 2020-05-14 (×3): qty 0.4

## 2020-05-14 MED ORDER — SODIUM CHLORIDE 0.9 % IV SOLN: INTRAVENOUS | Status: DC

## 2020-05-14 MED ORDER — GUAIFENESIN ER 600 MG PO TB12
600.0000 mg | ORAL_TABLET | Freq: Two times a day (BID) | ORAL | Status: DC
  Administered 2020-05-14 – 2020-05-16 (×5): 600 mg via ORAL
  Filled 2020-05-14 (×6): qty 1

## 2020-05-14 MED ORDER — VITAMIN D 25 MCG (1000 UNIT) PO TABS
1000.0000 [IU] | ORAL_TABLET | Freq: Every day | ORAL | Status: DC
  Administered 2020-05-14 – 2020-05-16 (×3): 1000 [IU] via ORAL
  Filled 2020-05-14 (×3): qty 1

## 2020-05-14 MED ORDER — ASCORBIC ACID 500 MG PO TABS
500.0000 mg | ORAL_TABLET | Freq: Every day | ORAL | Status: DC
  Administered 2020-05-14 – 2020-05-16 (×3): 500 mg via ORAL
  Filled 2020-05-14 (×3): qty 1

## 2020-05-14 MED ORDER — BENZONATATE 100 MG PO CAPS: 200.0000 mg | ORAL_CAPSULE | ORAL | Status: DC | PRN

## 2020-05-14 NOTE — ED Notes (Signed)
Attempted to call report

## 2020-05-14 NOTE — Evaluation (Signed)
Physical Therapy Evaluation Patient Details Name: Kurt Bailey MRN: [id number] DOB: 04-02-1946 Today's Date: 05/14/2020   History of Present Illness  Kurt Bailey is a 74 y/o male who was admitted for fever and covid 19 exposure. PMH includes diverticulitis, alcohol abuse, and was diagnosed with covid 19 a few days ago at [place] [facility].  Clinical Impression  Pt received lying in bed and agreeable to participate in PT evaluation. Pt on 2L O2 throughout session. Pt able to perform supine <> sit with supervision only. Pt performed sit <> stand transfers with SBA for safety and ambulated around nurses station with CGA. Pt did not utilize AD and was noted to stagger occasionally to both L and R. Pt also noted to lose balance with quick head turns requiring min A for steadying. Will trial RW at next session for improved balance with mobility. Pt's O2 saturations remained above 90% for all activities. Pt performed therex in sitting and standing and required verbal cues for correct technique. Increased cues for correct usage of incentive spirometer with pt able to reach between [phone] mL. Pt currently presents with deficits in functional activity tolerance, balance, cognition, and functional mobility. Recommend skilled PT to address current deficits and HHPT at discharge to optimize return to PLOF and maximize safety and independence with functional mobility.    Follow Up Recommendations Home health PT    Equipment Recommendations  Rolling walker with 5" wheels    Recommendations for Other Services       Precautions / Restrictions Precautions Precautions: Fall Restrictions Weight Bearing Restrictions: No      Mobility  Bed Mobility Overal bed mobility: Needs Assistance Bed Mobility: Supine to Sit;Sit to Supine     Supine to sit: Supervision Sit to supine: Supervision   General bed mobility comments: Supervision for safety when performing supine <> sti. Pt reported slight  lightheadedness with initial supine to sit however reported that it dissipated within 1 minute.   Transfers Overall transfer level: Needs assistance Equipment used: None Transfers: Sit to/from Stand Sit to Stand: Supervision         General transfer comment: SBA for sit <> stand transfers for safety however pt able to power through BLE to come into standing.  Ambulation/Gait Ambulation/Gait assistance: Min guard Gait Distance (Feet): 210 Feet Assistive device: None Gait Pattern/deviations: Staggering left;Staggering right;Narrow base of support;Decreased step length - right;Decreased step length - left Gait velocity: slightly decreased   General Gait Details: Pt ambulated around nurses station with CGA for safety. Pt with noted narrow BOS and staggered occasionally to L and R requiring min A for steadying. Noted LOB with quick head turns.  Stairs            Wheelchair Mobility    Modified Rankin (Stroke Patients Only)       Balance Overall balance assessment: Mild deficits observed, not formally tested                                           Pertinent Vitals/Pain Pain Assessment: No/denies pain    Home Living Family/patient expects to be discharged to:: Private residence Living Arrangements: Spouse/significant other Available Help at Discharge: Family;Available 24 hours/day Type of Home: House Home Access: Stairs to enter Entrance Stairs-Rails: Right;Left;Can reach both Entrance Stairs-Number of Steps: 4 Home Layout: One level Home Equipment: Other (comment) (owns walker but unsure what type) Additional Comments:  Pt reports owning some sort of walker but cannot remember what type it is.    Prior Function Level of Independence: Independent         Comments: Pt reports independence with transfers and ambulation with no AD. Pt denies fall history.     Hand Dominance        Extremity/Trunk Assessment   Upper Extremity  Assessment Upper Extremity Assessment: Generalized weakness;Overall [facility] for tasks assessed (grossly 4 to 4+/5 bilaterally)    Lower Extremity Assessment Lower Extremity Assessment: Generalized weakness;Overall WFL for tasks assessed (grossly 4 to 4+/5 bilaterally)    Cervical / Trunk Assessment Cervical / Trunk Assessment: Normal  Communication   Communication: No difficulties  Cognition Arousal/Alertness: Awake/alert Behavior During Therapy: WFL for tasks assessed/performed Overall Cognitive Status: Within Functional Limits for tasks assessed                                 General Comments: Pt able to state name, DOB, and current location. Reported month as the 8th month and unclear of the year.      General Comments      Exercises Other Exercises Other Exercises: pt performed 5TSTS in 17 seconds with SBA; pt then performed IS x 10 reps and able to reach between [phone] mL requiring increased cues for correct usage Other Exercises: sit <> stands x 10 with SBA for safety   Assessment/Plan    PT Assessment Patient needs continued PT services  PT Problem List Decreased strength;Decreased activity tolerance;Decreased balance;Decreased mobility;Decreased cognition;Decreased safety awareness;Cardiopulmonary status limiting activity       PT Treatment Interventions DME instruction;Gait training;Stair training;Functional mobility training;Therapeutic activities;Therapeutic exercise;Balance training;Cognitive remediation;Patient/family education    PT Goals (Current goals can be found in the Care Plan section)  Acute Rehab PT Goals Patient Stated Goal: to get better PT Goal Formulation: With patient Time For Goal Achievement: 05/28/20 Potential to Achieve Goals: Good    Frequency Min 2X/week   Barriers to discharge        Co-evaluation               AM-PAC PT "6 Clicks" Mobility  Outcome Measure Help needed turning from your back to your side while  in a flat bed without using bedrails?: None Help needed moving from lying on your back to sitting on the side of a flat bed without using bedrails?: A Little Help needed moving to and from a bed to a chair (including a wheelchair)?: A Little Help needed standing up from a chair using your arms (e.g., wheelchair or bedside chair)?: A Little Help needed to walk in hospital room?: A Little Help needed climbing 3-5 steps with a railing? : A Little 6 Click Score: 19    End of Session Equipment Utilized During Treatment: Gait belt;Oxygen;Other (comment) (2L O2 via nasal cannula) Activity Tolerance: Patient tolerated treatment well Patient left: in bed;with call bell/phone within reach;with bed alarm set Nurse Communication: Mobility status PT Visit Diagnosis: Unsteadiness on feet (R26.81);Other abnormalities of gait and mobility (R26.89);Muscle weakness (generalized) (M62.81)    Time: 1445-1510 PT Time Calculation (min) (ACUTE ONLY): 25 min   Charges:              Frederich Chick, SPT  Frederich Chick 05/14/2020, 4:53 PM

## 2020-05-14 NOTE — Plan of Care (Signed)
  Problem: Respiratory: Goal: Will maintain a patent airway Outcome: Progressing   Problem: Clinical Measurements: Goal: Ability to maintain clinical measurements within normal limits will improve Outcome: Progressing   Problem: Safety: Goal: Ability to remain free from injury will improve Outcome: Progressing

## 2020-05-14 NOTE — Progress Notes (Signed)
PROGRESS NOTE  Kurt Bailey  DJS:[id number] DOB: [birth year] DOA: 05/13/2020 PCP: Doreene Nest, NP   Brief Narrative: Kurt Bailey is a 74 y.o. male with a history of alcohol abuse, diverticulitis, and recent covid-19 diagnosis on 10/4 s/p monoclonal antibody 10/10 who presented to the ED 10/12 with fever, weakness, dyspnea, and dry cough. He was febrile to 101.8F, tachycardic and hypoxic on room air. CRP 10.3, PCT negative. d-dimer 1,813 and subsequent CTA chest showed no PE, but confirmed bilateral infiltrates. Remdesivir and steroids were started and the patient was admitted for acute hypoxic respiratory failure due to covid-19 pneumonia.   Assessment & Plan: Active Problems:   Acute hypoxemic respiratory failure due to COVID-19 [facility])  Acute hypoxemic respiratory failure due to covid-19 pneumonia: SARS-CoV-2 positive on 10/4. s/p monoclonal antibody 10/10.  - Continue remdesivir x5 days (10/12 - 10/16) - Continue steroids x10 days given hypoxemia. CRP 10.3.  - Encourage OOB, IS, FV, and awake proning if able - Continue airborne, contact precautions for 21 days from positive testing. - Monitor CMP and inflammatory markers - Enoxaparin prophylactic dose. - Baricitinib started 10/12. May consider discontinuing if hypoxia improves rapidly.   - Tolerating po, stop IVF - Encouraged to get vaccine.   Obesity: Estimated body mass index is 31.93 kg/m as calculated from the following:   Height as of this encounter: 5\' 8"  (1.727 m).   Weight as of this encounter: 95.3 kg.  Dementia:  - Continue namenda, depakote - Will continue contacting family to gather history and provide updates and review plan of care going forward. - Delirium precautions  Hypokalemia:  - Supplemented, will monitor  Dyslipidemia:  - Continue statin  DVT prophylaxis: Lovenox Code Status: Full Family Communication: Daughter by phone today. Disposition Plan:  Status is: Inpatient  Remains  inpatient appropriate because:Inpatient level of care appropriate due to severity of illness  Dispo: The patient is from: Home              Anticipated d/c is to: Home (PT, OT evaluation pending)              Anticipated d/c date is: 2 days              Patient currently is not medically stable to d/c.  Consultants:   None  Procedures:   None  Antimicrobials:  Remdesivir   Subjective: Breathing better than at admission, no dyspnea at rest when wearing oxygen. Eating better. Feels less systemically ill. No chest pain, no N/V/D, abd pain. Has not gotten up yet.   Objective: Vitals:   05/14/20 0159 05/14/20 0228 05/14/20 0740 05/14/20 1120  BP:  129/85 123/75 131/83  Pulse:  78 (!) 51 (!) 59  Resp:  (!) 21 20 17   Temp: 98 F (36.7 C) 97.7 F (36.5 C) 98.2 F (36.8 C) 97.8 F (36.6 C)  TempSrc: Oral Oral Oral Oral  SpO2:  95% 95% 94%  Weight:      Height:        Intake/Output Summary (Last 24 hours) at 05/14/2020 1404 Last data filed at 05/14/2020 0600 Gross per 24 hour  Intake 1338.85 ml  Output 400 ml  Net 938.85 ml   Filed Weights   05/13/20 1907  Weight: 95.3 kg   Gen: 74 y.o. male in no distress Pulm: Tachypneic breathing supplemental oxygen. Mild crackles diffusely. CV: Bradycardic. No murmur, rub, or gallop. No JVD, no pitting pedal edema. GI: Abdomen soft, non-tender, non-distended, with normoactive bowel sounds.  No organomegaly or masses felt. Ext: Warm, no deformities Skin: No rashes, lesions or ulcers Neuro: Alert and slightly confused. No focal neurological deficits. Psych: Judgement and insight appear normal. Mood & affect appropriate.   Data Reviewed: I have personally reviewed following labs and imaging studies  CBC: Recent Labs  Lab 05/13/20 1908  WBC 5.6  HGB 15.6  HCT 44.1  MCV 99.1  PLT 258   Basic Metabolic Panel: Recent Labs  Lab 05/13/20 1908 05/14/20 0349  NA 138  --   K 3.3*  --   CL 99  --   CO2 27  --   GLUCOSE 127*   --   BUN 14  --   CREATININE 1.02  --   CALCIUM 8.4*  --   MG  --  2.4   GFR: Estimated Creatinine Clearance: 72.3 mL/min (by C-G formula based on SCr of 1.02 mg/dL). Liver Function Tests: Recent Labs  Lab 05/13/20 1908  AST 57*  ALT 41  ALKPHOS 47  BILITOT 0.9  PROT 7.6  ALBUMIN 3.1*   No results for input(s): LIPASE, AMYLASE in the last 168 hours. No results for input(s): AMMONIA in the last 168 hours. Coagulation Profile: No results for input(s): INR, PROTIME in the last 168 hours. Cardiac Enzymes: No results for input(s): CKTOTAL, CKMB, CKMBINDEX, TROPONINI in the last 168 hours. BNP (last 3 results) No results for input(s): PROBNP in the last 8760 hours. HbA1C: No results for input(s): HGBA1C in the last 72 hours. CBG: No results for input(s): GLUCAP in the last 168 hours. Lipid Profile: No results for input(s): CHOL, HDL, LDLCALC, TRIG, CHOLHDL, LDLDIRECT in the last 72 hours. Thyroid Function Tests: No results for input(s): TSH, T4TOTAL, FREET4, T3FREE, THYROIDAB in the last 72 hours. Anemia Panel: Recent Labs    05/14/20 0349  FERRITIN 499*   Urine analysis:    Component Value Date/Time   COLORURINE Yellow 03/13/2014 0615   APPEARANCEUR Clear 03/13/2014 0615   LABSPEC 1.006 03/13/2014 0615   PHURINE 6.0 03/13/2014 0615   GLUCOSEU Negative 03/13/2014 0615   HGBUR 1+ 03/13/2014 0615   BILIRUBINUR Negative 03/13/2014 0615   KETONESUR Negative 03/13/2014 0615   PROTEINUR Negative 03/13/2014 0615   NITRITE Negative 03/13/2014 0615   LEUKOCYTESUR Negative 03/13/2014 0615   Recent Results (from the past 240 hour(s))  Blood culture (single)     Status: None (Preliminary result)   Collection Time: 05/13/20  8:59 PM   Specimen: BLOOD  Result Value Ref Range Status   Specimen Description BLOOD LEFT FA  Final   Special Requests   Final    BOTTLES DRAWN AEROBIC AND ANAEROBIC Blood Culture results may not be optimal due to an excessive volume of blood  received in culture bottles   Culture   Final    NO GROWTH < 12 HOURS Performed at [facility], [address] [place], [place] [address]    Report Status PENDING  Incomplete      Radiology Studies: DG Chest 2 View  Result Date: 05/13/2020 CLINICAL DATA:  Fever, COVID positive EXAM: CHEST - 2 VIEW COMPARISON:  03/13/2014 FINDINGS: Lung volumes are small, however, pulmonary insufflation is symmetric and stable since prior examination. There has developed moderate bilateral peripheral mid and lower lung zone predominant airspace infiltrates, likely infectious or inflammatory in the acute setting and compatible with the given history of COVID pneumonia. No pneumothorax or pleural effusion. Cardiac size within normal limits. Pulmonary vascularity is normal. No acute bone abnormality.  IMPRESSION: Multifocal pulmonary infiltrates, likely infectious or inflammatory in nature. Electronically Signed   By: Helyn Numbers MD   On: 05/13/2020 19:37   CT Angio Chest PE W and/or Wo Contrast  Result Date: 05/13/2020 CLINICAL DATA:  History of COVID-19 positivity with worsening shortness of breath and hypoxia EXAM: CT ANGIOGRAPHY CHEST WITH CONTRAST TECHNIQUE: Multidetector CT imaging of the chest was performed using the standard protocol during bolus administration of intravenous contrast. Multiplanar CT image reconstructions and MIPs were obtained to evaluate the vascular anatomy. CONTRAST:  27mL OMNIPAQUE IOHEXOL 350 MG/ML SOLN COMPARISON:  Chest x-ray from earlier in the same day. FINDINGS: Cardiovascular: Thoracic aorta is well visualize without aneurysmal dilatation or dissection. No significant cardiac enlargement is noted. No pericardial effusion is seen. No significant coronary calcifications are noted. The pulmonary artery shows a normal branching pattern without intraluminal filling defect to suggest pulmonary embolism. Mediastinum/Nodes: Thoracic inlet is within normal limits. Scattered  small mediastinal and hilar lymph nodes are noted likely reactive in nature. The esophagus is within normal limits as visualized. Lungs/Pleura: Lungs are well aerated bilaterally with peripheral ground-glass opacities throughout both lungs consistent with the given clinical history of COVID-19 positivity. Upper Abdomen: Visualized upper abdomen is unremarkable. Musculoskeletal: No chest wall abnormality. No acute or significant osseous findings. Review of the MIP images confirms the above findings. IMPRESSION: No evidence of pulmonary emboli. Changes in the lungs bilaterally consistent with the given clinical history of COVID-19 positivity. Electronically Signed   By: Alcide Clever M.D.   On: 05/13/2020 23:02    Scheduled Meds: . vitamin C  500 mg Oral Daily  . aspirin EC  81 mg Oral Daily  . atorvastatin  40 mg Oral QPM  . baricitinib  4 mg Oral Daily  . cholecalciferol  1,000 Units Oral Daily  . divalproex  125 mg Oral BID  . enoxaparin (LOVENOX) injection  40 mg Subcutaneous Q24H  . famotidine  20 mg Oral BID  . guaiFENesin  600 mg Oral BID  . mouth rinse  15 mL Mouth Rinse BID  . memantine  10 mg Oral BID  . methylPREDNISolone (SOLU-MEDROL) injection  1 mg/kg Intravenous Q12H   Followed by  . [START ON 05/17/2020] predniSONE  50 mg Oral Daily  . omega-3 acid ethyl esters  1,000 mg Oral QPM  . vitamin B-12  1,000 mcg Oral QPM  . zinc sulfate  220 mg Oral Daily   Continuous Infusions: . remdesivir 100 mg in NS 100 mL 100 mg (05/14/20 0949)     LOS: 1 day   Time spent: 25 minutes.  Tyrone Nine, MD [facility] [email or web address] 05/14/2020, 2:04 PM

## 2020-05-15 DIAGNOSIS — J9601 Acute respiratory failure with hypoxia: Secondary | ICD-10-CM | POA: Diagnosis not present

## 2020-05-15 DIAGNOSIS — U071 COVID-19: Secondary | ICD-10-CM | POA: Diagnosis not present

## 2020-05-15 LAB — COMPREHENSIVE METABOLIC PANEL
ALT: 43 U/L (ref 0–44)
AST: 49 U/L — ABNORMAL HIGH (ref 15–41)
Albumin: 2.7 g/dL — ABNORMAL LOW (ref 3.5–5.0)
Alkaline Phosphatase: 44 U/L (ref 38–126)
Anion gap: 9 (ref 5–15)
BUN: 18 mg/dL (ref 8–23)
CO2: 27 mmol/L (ref 22–32)
Calcium: 8.3 mg/dL — ABNORMAL LOW (ref 8.9–10.3)
Chloride: 104 mmol/L (ref 98–111)
Creatinine, Ser: 0.84 mg/dL (ref 0.61–1.24)
GFR, Estimated: >60 mL/min (ref >60)
Glucose, Bld: 197 mg/dL — ABNORMAL HIGH (ref 70–99)
Potassium: 3.5 mmol/L (ref 3.5–5.1)
Sodium: 140 mmol/L (ref 135–145)
Total Bilirubin: 0.8 mg/dL (ref 0.3–1.2)
Total Protein: 6.9 g/dL (ref 6.5–8.1)

## 2020-05-15 LAB — FIBRIN DERIVATIVES D-DIMER (ARMC ONLY): Fibrin derivatives D-dimer (ARMC): 1125.32 ng/mL (FEU) — ABNORMAL HIGH (ref 0.00–499.00)

## 2020-05-15 LAB — C-REACTIVE PROTEIN: CRP: 6.7 mg/dL — ABNORMAL HIGH (ref <1.0)

## 2020-05-15 MED ORDER — SODIUM CHLORIDE 0.9 % IV SOLN
INTRAVENOUS | Status: DC | PRN
  Administered 2020-05-15: 250 mL via INTRAVENOUS

## 2020-05-15 NOTE — Evaluation (Signed)
Occupational Therapy Evaluation Patient Details Name: Kurt Bailey MRN: [id number] DOB: 1946/07/16 Today's Date: 05/15/2020    History of Present Illness Kurt Bailey is a 74 y/o male who was admitted for fever and covid 19 exposure. PMH includes diverticulitis, alcohol abuse, and was diagnosed with covid 19 a few days ago at [facility].   Clinical Impression   Patient presenting with decreased I in self care, balance, functional mobility/transfers, and endurance. Pt on 2L O2 via [place] which was removed to leave pt on RA during entirety of session. Pt's O2 saturation remained above 90% throughout. Patient lives at home with wife and independent level without use of AD per pt report.Pt initially needing min guard - min A for standing tasks at sink. Once pt given RW he was overall supervision for mobility within the room and performed toileting transfer, hygiene, and clothing management after BM with min guard - close supervision. Pt also demonstrated how to properly utilize incentive spirometer.  Patient will benefit from acute OT to increase overall independence in the areas of ADLs, functional mobility, and safety awareness in order to safely discharge home with caregiver.    Follow Up Recommendations  Home health OT;Supervision - Intermittent    Equipment Recommendations  Tub/shower seat    Recommendations for Other Services Other (comment) (none at this time)     Precautions / Restrictions Precautions Precautions: Fall      Mobility Bed Mobility Overal bed mobility: Needs Assistance Bed Mobility: Supine to Sit;Sit to Supine     Supine to sit: Supervision Sit to supine: Supervision   General bed mobility comments: supervision for safety  Transfers Overall transfer level: Needs assistance Equipment used: Rolling walker (2 wheeled);1 person hand held assist Transfers: Sit to/from [employer] Sit to Stand: Min guard Stand pivot transfers: Min  guard       General transfer comment: min guard - min A without use of RW but able to progress to close supervision once given RW for ambulation within the room for toileting needs    Balance Overall balance assessment: Needs assistance Sitting-balance support: Feet supported Sitting balance-Leahy Scale: Good Sitting balance - Comments: no LOB   Standing balance support: During functional activity;No upper extremity supported Standing balance-Leahy Scale: Fair Standing balance comment: min A or reliance on RW          ADL either performed or assessed with clinical judgement   ADL Overall ADL's : Needs assistance/impaired     Grooming: Wash/dry hands;Wash/dry face;Oral care;Standing;Min guard     Lower Body Dressing: Minimal assistance;Sit to/from stand   Toilet Transfer: Regular Toilet;Min guard   Toileting- Clothing Manipulation and Hygiene: Sit to/from stand;Min guard         Vision Baseline Vision/History: No visual deficits Patient Visual Report: No change from baseline              Pertinent Vitals/Pain Pain Assessment: No/denies pain     Hand Dominance Right   Extremity/Trunk Assessment Upper Extremity Assessment Upper Extremity Assessment: Generalized weakness   Lower Extremity Assessment Lower Extremity Assessment: Generalized weakness   Cervical / Trunk Assessment Cervical / Trunk Assessment: Normal   Communication Communication Communication: No difficulties   Cognition Arousal/Alertness: Awake/alert Behavior During Therapy: WFL for tasks assessed/performed Overall Cognitive Status: Within Functional Limits for tasks assessed     General Comments: A & O x4              Home Living Family/patient expects to be discharged to::  Private residence Living Arrangements: Spouse/significant other Available Help at Discharge: Family;Available 24 hours/day Type of Home: House Home Access: Stairs to enter [employer] of Steps:  4 Entrance Stairs-Rails: Right;Left;Can reach both Home Layout: One level     Bathroom Shower/Tub: Chief Strategy Officer: Standard     Home Equipment: Other (comment)   Additional Comments: Pt reports owning some sort of walker but cannot remember what type it is.      Prior Functioning/Environment Level of Independence: Independent        Comments: Pt reports independence with transfers and ambulation with no AD. Pt denies fall history.        OT Problem List: Decreased strength;Decreased activity tolerance;Decreased safety awareness;Decreased knowledge of use of DME or AE;Impaired balance (sitting and/or standing)      OT Treatment/Interventions: Self-care/ADL training;Therapeutic exercise;Therapeutic activities;Energy conservation;Patient/family education;DME and/or AE instruction;Balance training    OT Goals(Current goals can be found in the care plan section) Acute Rehab OT Goals Patient Stated Goal: to get better and go home OT Goal Formulation: With patient Time For Goal Achievement: 05/29/20 Potential to Achieve Goals: Good ADL Goals Pt Will Perform Grooming: with modified independence;standing Pt Will Transfer to Toilet: with modified independence;ambulating Pt Will Perform Toileting - Clothing Manipulation and hygiene: with modified independence;sit to/from stand Pt Will Perform Tub/Shower Transfer: Tub transfer;with supervision;shower seat;ambulating;rolling walker  OT Frequency: Min 1X/week   Barriers to D/C: Other (comment)  none known at this time          AM-PAC OT "6 Clicks" Daily Activity     Outcome Measure Help from another person eating meals?: None Help from another person taking care of personal grooming?: A Little Help from another person toileting, which includes using toliet, bedpan, or urinal?: A Little Help from another person bathing (including washing, rinsing, drying)?: A Little Help from another person to put on and  taking off regular upper body clothing?: A Little Help from another person to put on and taking off regular lower body clothing?: A Little 6 Click Score: 19   End of Session Equipment Utilized During Treatment: Rolling walker Nurse Communication: Mobility status  Activity Tolerance: Patient tolerated treatment well Patient left: in bed;with call bell/phone within reach  OT Visit Diagnosis: Unsteadiness on feet (R26.81);Muscle weakness (generalized) (M62.81)                Time: 5784-6962 OT Time Calculation (min): 45 min Charges:  OT General Charges $OT Visit: 1 Visit OT Evaluation $OT Eval Low Complexity: 1 Low OT Treatments $Self Care/Home Management : 23-37 mins  Jackquline Denmark, MS, OTR/L , CBIS ascom [phone]  05/15/20, 11:21 AM

## 2020-05-15 NOTE — Progress Notes (Addendum)
PROGRESS NOTE  Kurt Bailey  GYI:[id number] DOB: 1945-11-03 DOA: 05/13/2020 PCP: Doreene Nest, NP   Brief Narrative: Kurt Bailey is a 74 y.o. male with a history of alcohol abuse, diverticulitis, and recent covid-19 diagnosis on 10/4 s/p monoclonal antibody 10/10 who presented to the ED 10/12 with fever, weakness, dyspnea, and dry cough. He was febrile to 101.21F, tachycardic and hypoxic on room air. CRP 10.3, PCT negative. d-dimer 1,813 and subsequent CTA chest showed no PE, but confirmed bilateral infiltrates. Remdesivir and steroids were started and the patient was admitted for acute hypoxic respiratory failure due to covid-19 pneumonia.   Assessment & Plan: Active Problems:   Acute hypoxemic respiratory failure due to COVID-19 [facility])  Acute hypoxemic respiratory failure due to covid-19 pneumonia: SARS-CoV-2 positive on 10/4. s/p monoclonal antibody 10/10.  - Continue remdesivir x5 days (10/12 - 10/16) - Continue steroids x10 days given hypoxemia. CRP 10.3 > 6.7. With rapid improvement, can stop baricitinib.  - Encourage OOB, IS, FV, and awake proning if able - Continue airborne, contact precautions for 21 days from positive testing. - Monitor CRP. LFTs improving. ALT wnl. - Enoxaparin prophylactic dose. - Encouraged to get vaccine.   Obesity: Estimated body mass index is 31.93 kg/m as calculated from the following:   Height as of this encounter: 5\' 8"  (1.727 m).   Weight as of this encounter: 95.3 kg.  Dementia:  - Continue namenda, depakote - Will continue contacting family to gather history and provide updates and review plan of care going forward. - Delirium precautions  Hypokalemia: Resolved.   Dyslipidemia:  - Continue statin  DVT prophylaxis: Lovenox Code Status: Full Family Communication: Daughter by phone daily Disposition Plan:  Status is: Inpatient  Remains inpatient appropriate because:Inpatient level of care appropriate due to severity of  illness  Dispo: The patient is from: Home              Anticipated d/c is to: Home with HH-PT, HH-OT, RW              Anticipated d/c date is: 1 day              Patient currently is not medically stable to d/c.  Consultants:   None  Procedures:   None  Antimicrobials:  Remdesivir   Subjective: Feeling better, eating better. No shortness of breath at rest, still limits activity but has been weaned to room air this afternoon. Wife is being admitted for covid.   Objective: Vitals:   05/15/20 0446 05/15/20 0725 05/15/20 1031 05/15/20 1105  BP: 125/76 136/76  124/81  Pulse:      Resp: (!) 21 20  18   Temp: 97.6 F (36.4 C) 98 F (36.7 C)  98.3 F (36.8 C)  TempSrc: Oral Oral  Oral  SpO2:  94% 91%   Weight:      Height:        Intake/Output Summary (Last 24 hours) at 05/15/2020 1311 Last data filed at 05/14/2020 1628 Gross per 24 hour  Intake 100 ml  Output --  Net 100 ml   Gen: 74 y.o. male in no distress Pulm: Nonlabored breathing room air. Minimal crackles. CV: Regular rate and rhythm. No murmur, rub, or gallop. No JVD,tracedependent edema. GI: Abdomen soft, non-tender, non-distended, with normoactive bowel sounds.  Ext: Warm, no deformities Skin: No rashes, lesions or ulcers on visualized skin. Neuro: Alert and incompletely oriented. No focal neurological deficits. Psych: Judgement and insight appear limited. Mood euthymic & affect  congruent. Behavior is appropriate.    Data Reviewed: I have personally reviewed following labs and imaging studies  CBC: Recent Labs  Lab 05/13/20 1908  WBC 5.6  HGB 15.6  HCT 44.1  MCV 99.1  PLT 258   Basic Metabolic Panel: Recent Labs  Lab 05/13/20 1908 05/14/20 0349 05/15/20 0524  NA 138  --  140  K 3.3*  --  3.5  CL 99  --  104  CO2 27  --  27  GLUCOSE 127*  --  197*  BUN 14  --  18  CREATININE 1.02  --  0.84  CALCIUM 8.4*  --  8.3*  MG  --  2.4  --    GFR: Estimated Creatinine Clearance: 87.7 mL/min (by  C-G formula based on SCr of 0.84 mg/dL). Liver Function Tests: Recent Labs  Lab 05/13/20 1908 05/15/20 0524  AST 57* 49*  ALT 41 43  ALKPHOS 47 44  BILITOT 0.9 0.8  PROT 7.6 6.9  ALBUMIN 3.1* 2.7*   Anemia Panel: Recent Labs    05/14/20 0349  FERRITIN 499*   Urine analysis:    Component Value Date/Time   COLORURINE Yellow 03/13/2014 0615   APPEARANCEUR Clear 03/13/2014 0615   LABSPEC 1.006 03/13/2014 0615   PHURINE 6.0 03/13/2014 0615   GLUCOSEU Negative 03/13/2014 0615   HGBUR 1+ 03/13/2014 0615   BILIRUBINUR Negative 03/13/2014 0615   KETONESUR Negative 03/13/2014 0615   PROTEINUR Negative 03/13/2014 0615   NITRITE Negative 03/13/2014 0615   LEUKOCYTESUR Negative 03/13/2014 0615   Recent Results (from the past 240 hour(s))  Blood culture (single)     Status: None (Preliminary result)   Collection Time: 05/13/20  8:59 PM   Specimen: BLOOD  Result Value Ref Range Status   Specimen Description BLOOD LEFT FA  Final   Special Requests   Final    BOTTLES DRAWN AEROBIC AND ANAEROBIC Blood Culture results may not be optimal due to an excessive volume of blood received in culture bottles   Culture   Final    NO GROWTH 2 DAYS Performed at [facility], [address], [place] [facility] [place], [place] [address]    Report Status PENDING  Incomplete      Radiology Studies: DG Chest 2 View  Result Date: 05/13/2020 CLINICAL DATA:  Fever, COVID positive EXAM: CHEST - 2 VIEW COMPARISON:  03/13/2014 FINDINGS: Lung volumes are small, however, pulmonary insufflation is symmetric and stable since prior examination. There has developed moderate bilateral peripheral mid and lower lung zone predominant airspace infiltrates, likely infectious or inflammatory in the acute setting and compatible with the given history of COVID pneumonia. No pneumothorax or pleural effusion. Cardiac size within normal limits. Pulmonary vascularity is normal. No acute bone abnormality. IMPRESSION: Multifocal  pulmonary infiltrates, likely infectious or inflammatory in nature. Electronically Signed   By: Helyn Numbers MD   On: 05/13/2020 19:37   CT Angio Chest PE W and/or Wo Contrast  Result Date: 05/13/2020 CLINICAL DATA:  History of COVID-19 positivity with worsening shortness of breath and hypoxia EXAM: CT ANGIOGRAPHY CHEST WITH CONTRAST TECHNIQUE: Multidetector CT imaging of the chest was performed using the standard protocol during bolus administration of intravenous contrast. Multiplanar CT image reconstructions and MIPs were obtained to evaluate the vascular anatomy. CONTRAST:  59mL OMNIPAQUE IOHEXOL 350 MG/ML SOLN COMPARISON:  Chest x-ray from earlier in the same day. FINDINGS: Cardiovascular: Thoracic aorta is well visualize without aneurysmal dilatation or dissection. No significant cardiac enlargement is noted. No pericardial  effusion is seen. No significant coronary calcifications are noted. The pulmonary artery shows a normal branching pattern without intraluminal filling defect to suggest pulmonary embolism. Mediastinum/Nodes: Thoracic inlet is within normal limits. Scattered small mediastinal and hilar lymph nodes are noted likely reactive in nature. The esophagus is within normal limits as visualized. Lungs/Pleura: Lungs are well aerated bilaterally with peripheral ground-glass opacities throughout both lungs consistent with the given clinical history of COVID-19 positivity. Upper Abdomen: Visualized upper abdomen is unremarkable. Musculoskeletal: No chest wall abnormality. No acute or significant osseous findings. Review of the MIP images confirms the above findings. IMPRESSION: No evidence of pulmonary emboli. Changes in the lungs bilaterally consistent with the given clinical history of COVID-19 positivity. Electronically Signed   By: Alcide Clever M.D.   On: 05/13/2020 23:02    Scheduled Meds: . vitamin C  500 mg Oral Daily  . aspirin EC  81 mg Oral Daily  . atorvastatin  40 mg Oral QPM  .  cholecalciferol  1,000 Units Oral Daily  . divalproex  125 mg Oral BID  . enoxaparin (LOVENOX) injection  40 mg Subcutaneous Q24H  . famotidine  20 mg Oral BID  . guaiFENesin  600 mg Oral BID  . mouth rinse  15 mL Mouth Rinse BID  . memantine  10 mg Oral BID  . methylPREDNISolone (SOLU-MEDROL) injection  1 mg/kg Intravenous Q12H   Followed by  . [START ON 05/17/2020] predniSONE  50 mg Oral Daily  . omega-3 acid ethyl esters  1,000 mg Oral QPM  . vitamin B-12  1,000 mcg Oral QPM  . zinc sulfate  220 mg Oral Daily   Continuous Infusions: . sodium chloride 250 mL (05/15/20 0748)  . remdesivir 100 mg in NS 100 mL 100 mg (05/15/20 0823)     LOS: 2 days   Time spent: 25 minutes.  Tyrone Nine, MD [facility] [email or web address] 05/15/2020, 1:11 PM

## 2020-05-15 NOTE — TOC Initial Note (Signed)
Transition of Care [facility]) - Initial/Assessment Note    Patient Details  Name: Kurt Bailey MRN: [id number] Date of Birth: 08/08/1945  Transition of Care [facility]) CM/SW Contact:    Allayne Butcher, RN Phone Number: 05/15/2020, 2:05 PM  Clinical Narrative:                 Patient admitted to the hospital with COVID on 10/13, patient's wife admitted today with COVID.  Patient has not required supplemental oxygen and planned discharge date is for tomorrow back home.  RNCM was able to speak with patient's daughter, Kyra Manges, via phone.  Patient and his wife, and their adult son, [place] live together in [place], daughter Kyra Manges lives about 5 minutes from them and is going to be checking in and helping once the patient and wife are discharged from the hospital.  Patient is mostly independent and has no assistive devices at home.  PT has evaluated and determined that patient would benefit from home health services and a RW.  Dirk Dress is concerned that her mother does not want anyone coming out to their home so [facility] agreed to let home health come to her home and she will just bring her dad over for therapy sessions.  Daughter's address is [address] [place] [address].  Daughter will be able to come and pick patient up.  [facility] has [facility] for home health PT and OT.  [facility] will provide the patient with a rolling walker.   Expected Discharge Plan: Home w Home Health Services Barriers to Discharge: Continued Medical Work up   Patient Goals and CMS Choice   CMS Medicare.gov Compare Post Acute Care list provided to:: Patient Represenative (must comment) Choice offered to / list presented to : Adult Children  Expected Discharge Plan and Services Expected Discharge Plan: Home w Home Health Services   Discharge Planning Services: CM Consult Post Acute Care Choice: Home Health Living arrangements for the past 2 months: Single Family Home                 DME Arranged: Walker  rolling DME Agency: [facility] Date DME Agency Contacted: 05/15/20 Time DME Agency Contacted: [phone] Representative spoke with at DME Agency: Elease Hashimoto HH Arranged: PT, OT HH Agency: [facility] Date HH Agency Contacted: 05/15/20 Time HH Agency Contacted: 1402 Representative spoke with at [facility] Agency: [name]  Prior Living Arrangements/Services Living arrangements for the past 2 months: Single Family Home Lives with:: Spouse Patient language and need for interpreter reviewed:: Yes Do you feel safe going back to the place where you live?: Yes      Need for Family Participation in Patient Care: Yes (Comment) (COVID, memory impairment) Care giver support system in place?: Yes (comment) (daughter, wife and 3 sons)   Criminal Activity/Legal Involvement Pertinent to Current Situation/Hospitalization: No - Comment as needed  Activities of Daily Living Home Assistive Devices/Equipment: Dentures (specify type) ADL Screening (condition at time of admission) Patient's cognitive ability adequate to safely complete daily activities?: Yes Is the patient deaf or have difficulty hearing?: No Does the patient have difficulty seeing, even when wearing glasses/contacts?: No Does the patient have difficulty concentrating, remembering, or making decisions?: No Patient able to express need for assistance with ADLs?: Yes Does the patient have difficulty dressing or bathing?: No Independently performs ADLs?: Yes (appropriate for developmental age) Does the patient have difficulty walking or climbing stairs?: No Weakness of Legs: None Weakness of Arms/Hands: None  Permission Sought/Granted Permission sought  to share information with : Case Manager, Family Supports, Other (comment) Permission granted to share information with : Yes, Verbal Permission Granted  Share Information with NAME: Malissa  Permission granted to share info w AGENCY: [facility]  Permission granted to  share info w Relationship: daughter     Emotional Assessment       Orientation: : Oriented to Self, Oriented to Place, Oriented to Situation Alcohol / Substance Use: Not Applicable Psych Involvement: No (comment)  Admission diagnosis:  Acute hypoxemic respiratory failure due to COVID-19 (HCC) [U07.1, J96.01] Patient Active Problem List   Diagnosis Date Noted  . Acute hypoxemic respiratory failure due to COVID-19 (HCC) 05/13/2020  . Hyperlipidemia 07/04/2019  . Vitamin B 12 deficiency 09/20/2018  . Memory changes 09/19/2018   PCP:  Doreene Nest, NP Pharmacy:   [facility] [address] [place] - [address] [place] [address] Phone: [phone] Fax: [phone]     Social Determinants of Health (SDOH) Interventions    Readmission Risk Interventions No flowsheet data found.

## 2020-05-16 DIAGNOSIS — U071 COVID-19: Secondary | ICD-10-CM | POA: Diagnosis not present

## 2020-05-16 DIAGNOSIS — J9601 Acute respiratory failure with hypoxia: Secondary | ICD-10-CM | POA: Diagnosis not present

## 2020-05-16 LAB — C-REACTIVE PROTEIN: CRP: 2.8 mg/dL — ABNORMAL HIGH (ref <1.0)

## 2020-05-16 MED ORDER — DEXAMETHASONE 6 MG PO TABS: 6.0000 mg | ORAL_TABLET | Freq: Every day | ORAL | 0 refills | Status: AC

## 2020-05-16 NOTE — Care Management Important Message (Signed)
Important Message  Patient Details  Name: BILLIE INTRIAGO MRN: [id number] Date of Birth: Aug 23, 1945   Medicare Important Message Given:  Yes  Reviewed with patient via room phone due to isolation status.  Declined copy of Medicare IM at this time as copy being mailed to home address by [facility].   Johnell Comings 05/16/2020, 3:14 PM

## 2020-05-16 NOTE — Progress Notes (Signed)
Patient scheduled for outpatient Remdesivir infusion at 11 am on Saturday 10/16 at [facility]. Please inform the patient to park at [address], [place], as staff will be escorting the patient through the east entrance of the hospital. Appointments take approximately 45 minutes.    There is a wave flag banner located near the entrance on [address] Turn into this entrance and immediately turn left or right and park in 1 of the 10 designated Covid Infusion Parking spots. There is a phone number on the sign, please call and let the staff know what spot you are in and we will come out and get you. For questions call [phone].  Thanks.  

## 2020-05-16 NOTE — Progress Notes (Signed)
Pt AAox4, VS stable, on room air. Pt with no complaints today. Plan for patient to discharge home with family. Discharge instructions reviewed with patient, no questions or concerns. IV removed. Plan for daughter to transport patient home.

## 2020-05-16 NOTE — TOC Transition Note (Signed)
Transition of Care [facility]) - CM/SW Discharge Note   Patient Details  Name: Kurt Bailey MRN: [id number] Date of Birth: November 10, 1945  Transition of Care [facility]) CM/SW Contact:  Allayne Butcher, RN Phone Number: 05/16/2020, 11:00 AM   Clinical Narrative:    Patient is medically ready for discharge home today with home health services.  [name] with [facility] is aware of discharge today.  [facility] has provided a walker.  Patient's daughter will be picking him up today but cannot get here before 1730.     Final next level of care: Home w Home Health Services Barriers to Discharge: Barriers Resolved   Patient Goals and CMS Choice   CMS Medicare.gov Compare Post Acute Care list provided to:: Patient Represenative (must comment) Choice offered to / list presented to : Adult Children  Discharge Placement                       Discharge Plan and Services   Discharge Planning Services: CM Consult Post Acute Care Choice: Home Health          DME Arranged: Walker rolling DME Agency: [facility] Date DME Agency Contacted: 05/15/20 Time DME Agency Contacted: [phone] Representative spoke with at DME Agency: Elease Hashimoto HH Arranged: PT, OT [facility] Agency: [facility] Date HH Agency Contacted: 05/16/20 Time HH Agency Contacted: 1100 Representative spoke with at [facility] Agency: [name]  Social Determinants of Health (SDOH) Interventions     Readmission Risk Interventions No flowsheet data found.

## 2020-05-16 NOTE — Discharge Instructions (Signed)
Patient scheduled for outpatient Remdesivir infusion at 11 am on Saturday 10/16 at [facility]. Please inform the patient to park at [address] [place], [place], as staff will be escorting the patient through the east entrance of the hospital. Appointments take approximately 45 minutes.    There is a wave flag banner located near the entrance on [address]. Turn into this entrance and immediately turn left or right and park in 1 of the 10 designated Covid Infusion Parking spots. There is a phone number on the sign, please call and let the staff know what spot you are in and we will come out and get you. For questions call [phone].  Thanks.

## 2020-05-16 NOTE — Discharge Summary (Signed)
Physician Discharge Summary  Kurt Bailey ZOX:[id number] DOB: 07-10-1946 DOA: 05/13/2020  PCP: Doreene Nest, NP  Admit date: 05/13/2020 Discharge date: 05/16/2020  Admitted From: Home Disposition: Home   Recommendations for Outpatient Follow-up:  1. Follow up with PCP in 1-2 weeks  Home Health: PT, OT Equipment/Devices: Rolling walker Discharge Condition: Stable CODE STATUS: Full Diet recommendation: Regular  Brief/Interim Summary: Kurt Bailey is a 74 y.o. male with a history of alcohol abuse, diverticulitis, and recent covid-19 diagnosis on 10/4 s/p monoclonal antibody 10/10 who presented to the ED 10/12 with fever, weakness, dyspnea, and dry cough. He was febrile to 101.84F, tachycardic and hypoxic on room air. CRP 10.3, PCT negative. d-dimer 1,813 and subsequent CTA chest showed no PE, but confirmed bilateral infiltrates. Remdesivir and steroids were started and the patient was admitted for acute hypoxic respiratory failure due to covid-19 pneumonia. With typical treatment, oxygen was weaned and he has remained stable for discharge to complete the final dose of remdesivir 10/16 and 5 more days of steroids.  Discharge Diagnoses:  Active Problems:   Acute hypoxemic respiratory failure due to COVID-19 [facility])  Acute hypoxemic respiratory failure due to covid-19 pneumonia: SARS-CoV-2 positive on 10/4. s/p monoclonal antibody 10/10.  - Continue remdesivir x5 days (10/12 - 10/16). Final appt scheduled. I confirmed with his daughter that he will have transportation to this appointment. - Continue steroids x10 days given hypoxemia. CRP 10.3 > 6.7 > 2.8. With rapid improvement stopped baricitinib. - Continue airborne, contact precautions for 21 days from positive testing. - Monitor CMP at follow up. LFTs improving. ALT wnl. - Encouraged to get vaccine.   Obesity: Estimated body mass index is 31.93 kg/m as calculated from the following:   Height as of this encounter:   (1.727 m).   Weight as of this encounter: 95.3 kg.  Dementia:  - Continue namenda, depakote - Speaking with family daily. Daughter is very helpful.  - Delirium precautions  Hypokalemia: Resolved.   Dyslipidemia:  - Continue statin  Discharge Instructions Discharge Instructions    Discharge instructions   Complete by: As directed    You are being discharged from the hospital after treatment for covid-19 infection. You are felt to be stable enough to no longer require inpatient monitoring, testing, and treatment, though you will need to follow the recommendations below: - Continue taking decadron once daily in the morning starting tomorrow for 5 days.  - Go to [facility] tomorrow at 11am for the final dose of remdesivir. - Per CDC guidelines, you will need to remain in isolation for 21 days from your first positive covid test. - Follow up with your doctor in the next week via telehealth or seek medical attention right away if your symptoms get WORSE.  - You are still encouraged to get a covid vaccination between 21 days (after isolation period ends) and 90 days (before immunity is thought to wear off).  Directions for you at home:  Wear a facemask You should wear a facemask that covers your nose and mouth when you are in the same room with other people and when you visit a healthcare provider. People who live with or visit you should also wear a facemask while they are in the same room with you.  Separate yourself from other people in your home As much as possible, you should stay in a different room from other people in your home. Also, you should use a separate bathroom, if available.  Avoid sharing  household items You should not share dishes, drinking glasses, cups, eating utensils, towels, bedding, or other items with other people in your home. After using these items, you should wash them thoroughly with soap and water.  Cover your coughs and sneezes Cover your  mouth and nose with a tissue when you cough or sneeze, or you can cough or sneeze into your sleeve. Throw used tissues in a lined trash can, and immediately wash your hands with soap and water for at least 20 seconds or use an alcohol-based hand rub.  Wash your [employer] your hands often and thoroughly with soap and water for at least 20 seconds. You can use an alcohol-based hand sanitizer if soap and water are not available and if your hands are not visibly dirty. Avoid touching your eyes, nose, and mouth with unwashed hands.  Directions for those who live with, or provide care at home for you:  Limit the number of people who have contact with the patient If possible, have only one caregiver for the patient. Other household members should stay in another home or place of residence. If this is not possible, they should stay in another room, or be separated from the patient as much as possible. Use a separate bathroom, if available. Restrict visitors who do not have an essential need to be in the home.  Ensure good ventilation Make sure that shared spaces in the home have good air flow, such as from an air conditioner or an opened window, weather permitting.  Wash your hands often Wash your hands often and thoroughly with soap and water for at least 20 seconds. You can use an alcohol based hand sanitizer if soap and water are not available and if your hands are not visibly dirty. Avoid touching your eyes, nose, and mouth with unwashed hands. Use disposable paper towels to dry your hands. If not available, use dedicated cloth towels and replace them when they become wet.  Wear a facemask and gloves Wear a disposable facemask at all times in the room and gloves when you touch or have contact with the patient's blood, body fluids, and/or secretions or excretions, such as sweat, saliva, sputum, nasal mucus, vomit, urine, or feces.  Ensure the mask fits over your nose and mouth tightly, and do  not touch it during use. Throw out disposable facemasks and gloves after using them. Do not reuse. Wash your hands immediately after removing your facemask and gloves. If your personal clothing becomes contaminated, carefully remove clothing and launder. Wash your hands after handling contaminated clothing. Place all used disposable facemasks, gloves, and other waste in a lined container before disposing them with other household waste. Remove gloves and wash your hands immediately after handling these items.  Do not share dishes, glasses, or other household items with the patient Avoid sharing household items. You should not share dishes, drinking glasses, cups, eating utensils, towels, bedding, or other items with a patient who is confirmed to have, or being evaluated for, COVID-19 infection. After the person uses these items, you should wash them thoroughly with soap and water.  Wash laundry thoroughly Immediately remove and wash clothes or bedding that have blood, body fluids, and/or secretions or excretions, such as sweat, saliva, sputum, nasal mucus, vomit, urine, or feces, on them. Wear gloves when handling laundry from the patient. Read and follow directions on labels of laundry or clothing items and detergent. In general, wash and dry with the warmest temperatures recommended on the label.  Clean  all areas the individual has used often Clean all touchable surfaces, such as counters, tabletops, doorknobs, bathroom fixtures, toilets, phones, keyboards, tablets, and bedside tables, every day. Also, clean any surfaces that may have blood, body fluids, and/or secretions or excretions on them. Wear gloves when cleaning surfaces the patient has come in contact with. Use a diluted bleach solution (e.g., dilute bleach with 1 part bleach and 10 parts water) or a household disinfectant with a label that says EPA-registered for coronaviruses. To make a bleach solution at home, add 1 tablespoon of  bleach to 1 quart (4 cups) of water. For a larger supply, add  cup of bleach to 1 gallon (16 cups) of water. Read labels of cleaning products and follow recommendations provided on product labels. Labels contain instructions for safe and effective use of the cleaning product including precautions you should take when applying the product, such as wearing gloves or eye protection and making sure you have good ventilation during use of the product. Remove gloves and wash hands immediately after cleaning.  Monitor yourself for signs and symptoms of illness Caregivers and household members are considered close contacts, should monitor their health, and will be asked to limit movement outside of the home to the extent possible. Follow the monitoring steps for close contacts listed on the symptom monitoring form.  If you have additional questions, contact your local health department or call the epidemiologist on call at [phone] (available 24/7). This guidance is subject to change. For the most up-to-date guidance from CDC, please refer to their website: TripMetro.hu     Allergies as of 05/16/2020   No Known Allergies     Medication List    TAKE these medications   acetaminophen 500 MG tablet Commonly known as: TYLENOL Take 500 mg by mouth every 6 (six) hours as needed.   atorvastatin 40 MG tablet Commonly known as: LIPITOR Take 1 tablet (40 mg total) by mouth every evening. For cholesterol.   benzonatate 200 MG capsule Commonly known as: TESSALON Take 200 mg by mouth 3 (three) times daily as needed.   dexamethasone 6 MG tablet Commonly known as: Decadron Take 1 tablet (6 mg total) by mouth daily for 5 days. Start taking on: May 17, 2020   divalproex 250 MG DR tablet Commonly known as: DEPAKOTE Take 125 mg by mouth 2 (two) times daily.   memantine 10 MG tablet Commonly known as: NAMENDA Take 10 mg by mouth 2  (two) times daily.   Omega-3 1000 MG Caps Take 1,000 mg by mouth every evening.   vitamin B-12 1000 MCG tablet Commonly known as: CYANOCOBALAMIN Take 1,000 mcg by mouth every evening.            Durable Medical Equipment  (From admission, onward)         Start     Ordered   05/15/20 1312  For home use only DME Walker rolling  Once       Question Answer Comment  Walker: With 5 Inch Wheels   Patient needs a walker to treat with the following condition Gait instability      05/15/20 1311          Follow-up Information    Doreene Nest, NP Follow up.   Specialty: Internal Medicine Contact information: [address] [place] [address] [phone]        [facility] Go on 05/17/2020.   Why: to [address] at 11am for remdesivir infusion  Contact information: [address] [place] [address] [phone]             No Known Allergies  Consultations:  None  Procedures/Studies: DG Chest 2 View  Result Date: 05/13/2020 CLINICAL DATA:  Fever, COVID positive EXAM: CHEST - 2 VIEW COMPARISON:  03/13/2014 FINDINGS: Lung volumes are small, however, pulmonary insufflation is symmetric and stable since prior examination. There has developed moderate bilateral peripheral mid and lower lung zone predominant airspace infiltrates, likely infectious or inflammatory in the acute setting and compatible with the given history of COVID pneumonia. No pneumothorax or pleural effusion. Cardiac size within normal limits. Pulmonary vascularity is normal. No acute bone abnormality. IMPRESSION: Multifocal pulmonary infiltrates, likely infectious or inflammatory in nature. Electronically Signed   By: Helyn Numbers MD   On: 05/13/2020 19:37   CT Angio Chest PE W and/or Wo Contrast  Result Date: 05/13/2020 CLINICAL DATA:  History of COVID-19 positivity with worsening shortness of breath and hypoxia EXAM: CT ANGIOGRAPHY CHEST WITH  CONTRAST TECHNIQUE: Multidetector CT imaging of the chest was performed using the standard protocol during bolus administration of intravenous contrast. Multiplanar CT image reconstructions and MIPs were obtained to evaluate the vascular anatomy. CONTRAST:  31mL OMNIPAQUE IOHEXOL 350 MG/ML SOLN COMPARISON:  Chest x-ray from earlier in the same day. FINDINGS: Cardiovascular: Thoracic aorta is well visualize without aneurysmal dilatation or dissection. No significant cardiac enlargement is noted. No pericardial effusion is seen. No significant coronary calcifications are noted. The pulmonary artery shows a normal branching pattern without intraluminal filling defect to suggest pulmonary embolism. Mediastinum/Nodes: Thoracic inlet is within normal limits. Scattered small mediastinal and hilar lymph nodes are noted likely reactive in nature. The esophagus is within normal limits as visualized. Lungs/Pleura: Lungs are well aerated bilaterally with peripheral ground-glass opacities throughout both lungs consistent with the given clinical history of COVID-19 positivity. Upper Abdomen: Visualized upper abdomen is unremarkable. Musculoskeletal: No chest wall abnormality. No acute or significant osseous findings. Review of the MIP images confirms the above findings. IMPRESSION: No evidence of pulmonary emboli. Changes in the lungs bilaterally consistent with the given clinical history of COVID-19 positivity. Electronically Signed   By: Alcide Clever M.D.   On: 05/13/2020 23:02    Subjective: Feels well, no dyspnea, chest pain, leg swelling, abd pain, or trouble getting around. Eager to go home.  Discharge Exam: Vitals:   05/16/20 0509 05/16/20 0804  BP: 126/67 (!) 143/72  Pulse: 64 62  Resp: 18 20  Temp: 97.8 F (36.6 C) (!) 97.3 F (36.3 C)  SpO2: 93% 91%   General: Pt is alert, awake, not in acute distress Cardiovascular: RRR, S1/S2 +, no rubs, no gallops Respiratory: CTA bilaterally, no wheezing, no  rhonchi Abdominal: Soft, NT, ND, bowel sounds + Extremities: No pitting edema, no cyanosis  Labs: BNP (last 3 results) No results for input(s): BNP in the last 8760 hours. Basic Metabolic Panel: Recent Labs  Lab 05/13/20 1908 05/14/20 0349 05/15/20 0524  NA 138  --  140  K 3.3*  --  3.5  CL 99  --  104  CO2 27  --  27  GLUCOSE 127*  --  197*  BUN 14  --  18  CREATININE 1.02  --  0.84  CALCIUM 8.4*  --  8.3*  MG  --  2.4  --    Liver Function Tests: Recent Labs  Lab 05/13/20 1908 05/15/20 0524  AST 57* 49*  ALT 41 43  ALKPHOS  47 44  BILITOT 0.9 0.8  PROT 7.6 6.9  ALBUMIN 3.1* 2.7*   No results for input(s): LIPASE, AMYLASE in the last 168 hours. No results for input(s): AMMONIA in the last 168 hours. CBC: Recent Labs  Lab 05/13/20 1908  WBC 5.6  HGB 15.6  HCT 44.1  MCV 99.1  PLT 258   Cardiac Enzymes: No results for input(s): CKTOTAL, CKMB, CKMBINDEX, TROPONINI in the last 168 hours. BNP: Invalid input(s): POCBNP CBG: No results for input(s): GLUCAP in the last 168 hours. D-Dimer No results for input(s): DDIMER in the last 72 hours. Hgb A1c No results for input(s): HGBA1C in the last 72 hours. Lipid Profile No results for input(s): CHOL, HDL, LDLCALC, TRIG, CHOLHDL, LDLDIRECT in the last 72 hours. Thyroid function studies No results for input(s): TSH, T4TOTAL, T3FREE, THYROIDAB in the last 72 hours.  Invalid input(s): FREET3 Anemia work up Recent Labs    05/14/20 0349  FERRITIN 499*   Urinalysis    Component Value Date/Time   COLORURINE Yellow 03/13/2014 0615   APPEARANCEUR Clear 03/13/2014 0615   LABSPEC 1.006 03/13/2014 0615   PHURINE 6.0 03/13/2014 0615   GLUCOSEU Negative 03/13/2014 0615   HGBUR 1+ 03/13/2014 0615   BILIRUBINUR Negative 03/13/2014 0615   KETONESUR Negative 03/13/2014 0615   PROTEINUR Negative 03/13/2014 0615   NITRITE Negative 03/13/2014 0615   LEUKOCYTESUR Negative 03/13/2014 0615    Microbiology Recent Results  (from the past 240 hour(s))  Blood culture (single)     Status: None (Preliminary result)   Collection Time: 05/13/20  8:59 PM   Specimen: BLOOD  Result Value Ref Range Status   Specimen Description BLOOD LEFT FA  Final   Special Requests   Final    BOTTLES DRAWN AEROBIC AND ANAEROBIC Blood Culture results may not be optimal due to an excessive volume of blood received in culture bottles   Culture   Final    NO GROWTH 3 DAYS Performed at [facility], [address] [place], [place] [id number]    Report Status PENDING  Incomplete    Time coordinating discharge: Approximately 40 minutes  Tyrone Nineyan B Tavonna Worthington, MD  [facility] 05/16/2020, 11:17 AM

## 2020-05-17 ENCOUNTER — Ambulatory Visit (HOSPITAL_COMMUNITY)
Admit: 2020-05-17 | Discharge: 2020-05-17 | Disposition: A | Discharge status: Home / Self Care | Payer: Medicare Other | Source: Ambulatory Visit | Attending: Pulmonary Disease | Admitting: Pulmonary Disease

## 2020-05-17 DIAGNOSIS — U071 COVID-19: Secondary | ICD-10-CM | POA: Insufficient documentation

## 2020-05-17 DIAGNOSIS — J1282 Pneumonia due to coronavirus disease 2019: Secondary | ICD-10-CM | POA: Diagnosis not present

## 2020-05-17 MED ORDER — SODIUM CHLORIDE 0.9 % IV SOLN
100.0000 mg | Freq: Once | INTRAVENOUS | Status: AC
  Administered 2020-05-17: 100 mg via INTRAVENOUS
  Filled 2020-05-17: qty 20

## 2020-05-17 MED ORDER — SODIUM CHLORIDE 0.9 % IV SOLN: INTRAVENOUS | Status: DC | PRN

## 2020-05-17 MED ORDER — DIPHENHYDRAMINE HCL 50 MG/ML IJ SOLN: 50.0000 mg | Freq: Once | INTRAMUSCULAR | Status: DC | PRN

## 2020-05-17 MED ORDER — METHYLPREDNISOLONE SODIUM SUCC 125 MG IJ SOLR: 125.0000 mg | Freq: Once | INTRAMUSCULAR | Status: DC | PRN

## 2020-05-17 MED ORDER — EPINEPHRINE 0.3 MG/0.3ML IJ SOAJ: 0.3000 mg | Freq: Once | INTRAMUSCULAR | Status: DC | PRN

## 2020-05-17 MED ORDER — ALBUTEROL SULFATE HFA 108 (90 BASE) MCG/ACT IN AERS: 2.0000 | INHALATION_SPRAY | Freq: Once | RESPIRATORY_TRACT | Status: DC | PRN

## 2020-05-17 MED ORDER — FAMOTIDINE IN NACL 20-0.9 MG/50ML-% IV SOLN: 20.0000 mg | Freq: Once | INTRAVENOUS | Status: DC | PRN

## 2020-05-17 NOTE — Progress Notes (Signed)
  Diagnosis: COVID-19  Physician: Dr. Delford Field   Procedure: [facility] Med: remdesivir infusion - Provided patient with remdesivir fact sheet for patients, parents and caregivers prior to infusion.  Complications: No immediate complications noted.  Discharge: Discharged home   Kurt Bailey 05/17/2020

## 2020-05-18 DIAGNOSIS — J9601 Acute respiratory failure with hypoxia: Secondary | ICD-10-CM | POA: Diagnosis not present

## 2020-05-18 DIAGNOSIS — K5792 Diverticulitis of intestine, part unspecified, without perforation or abscess without bleeding: Secondary | ICD-10-CM | POA: Diagnosis not present

## 2020-05-18 DIAGNOSIS — Z791 Long term (current) use of non-steroidal anti-inflammatories (NSAID): Secondary | ICD-10-CM | POA: Diagnosis not present

## 2020-05-18 DIAGNOSIS — Z9181 History of falling: Secondary | ICD-10-CM | POA: Diagnosis not present

## 2020-05-18 DIAGNOSIS — K429 Umbilical hernia without obstruction or gangrene: Secondary | ICD-10-CM | POA: Diagnosis not present

## 2020-05-18 DIAGNOSIS — Z7952 Long term (current) use of systemic steroids: Secondary | ICD-10-CM | POA: Diagnosis not present

## 2020-05-18 DIAGNOSIS — F0391 Unspecified dementia with behavioral disturbance: Secondary | ICD-10-CM | POA: Diagnosis not present

## 2020-05-18 DIAGNOSIS — F1011 Alcohol abuse, in remission: Secondary | ICD-10-CM | POA: Diagnosis not present

## 2020-05-18 DIAGNOSIS — U071 COVID-19: Secondary | ICD-10-CM | POA: Diagnosis not present

## 2020-05-18 DIAGNOSIS — E669 Obesity, unspecified: Secondary | ICD-10-CM | POA: Diagnosis not present

## 2020-05-18 DIAGNOSIS — J1282 Pneumonia due to coronavirus disease 2019: Secondary | ICD-10-CM | POA: Diagnosis not present

## 2020-05-18 DIAGNOSIS — E785 Hyperlipidemia, unspecified: Secondary | ICD-10-CM | POA: Diagnosis not present

## 2020-05-18 LAB — CULTURE, BLOOD (SINGLE): Culture: NO GROWTH

## 2020-05-19 ENCOUNTER — Telehealth: Payer: Self-pay

## 2020-05-19 ENCOUNTER — Telehealth: Payer: Self-pay | Admitting: *Deleted

## 2020-05-19 NOTE — Telephone Encounter (Signed)
Approved. Patient needs hospital follow-up when he can be allowed in our office.  Please schedule.

## 2020-05-19 NOTE — Telephone Encounter (Signed)
Darnelle, PT with [facility] is requesting verbal orders for PT 2 x/week for 3 weeks and 1 x/week for 6 weeks for muscle weakness, decreased endurance, decreased independence with mobility.  Patient declined OT at this time but is eager to do  PT.  Please feel free to leave a message on Darnelle's confidential VM.

## 2020-05-19 NOTE — Telephone Encounter (Signed)
Transition Care Management Follow-up Telephone Call  Date of discharge and from where: 05/16/2020, [facility]  How have you been since you were released from the hospital? Patient states that he is feeling much better. No complaints at this time.  Any questions or concerns? No  Items Reviewed:  Did the pt receive and understand the discharge instructions provided? Yes   Medications obtained and verified? Yes   Other? No   Any new allergies since your discharge? No   Dietary orders reviewed? Yes  Do you have support at home? Yes   Home Care and Equipment/Supplies: Were home health services ordered? yes If so, what is the name of the agency? [facility]  Has the agency set up a time to come to the patient's home? no Were any new equipment or medical supplies ordered?  No What is the name of the medical supply agency? N/A Were you able to get the supplies/equipment? not applicable Do you have any questions related to the use of the equipment or supplies? No  Functional Questionnaire: (I = Independent and D = Dependent) ADLs: I  Bathing/Dressing- I  Meal Prep- I  Eating- I  Maintaining continence- I  Transferring/Ambulation- I  Managing Meds- I  Follow up appointments reviewed:   PCP Hospital f/u appt confirmed? No  Patient states he will have his daughter call the office back and schedule the appointment.  Specialist Hospital f/u appt confirmed? N/A  Are transportation arrangements needed? No   If their condition worsens, is the pt aware to call PCP or go to the Emergency Dept.? Yes  Was the patient provided with contact information for the PCP's office or ED? Yes  Was to pt encouraged to call back with questions or concerns? Yes

## 2020-05-20 NOTE — Telephone Encounter (Signed)
Kurt Bailey advised and says daughter will get an appt for hospital follow up when it is safe to bring him in the office.  His wife was hospitalized with Covid also.

## 2020-05-22 DIAGNOSIS — E785 Hyperlipidemia, unspecified: Secondary | ICD-10-CM | POA: Diagnosis not present

## 2020-05-22 DIAGNOSIS — K5792 Diverticulitis of intestine, part unspecified, without perforation or abscess without bleeding: Secondary | ICD-10-CM | POA: Diagnosis not present

## 2020-05-22 DIAGNOSIS — U071 COVID-19: Secondary | ICD-10-CM | POA: Diagnosis not present

## 2020-05-22 DIAGNOSIS — J9601 Acute respiratory failure with hypoxia: Secondary | ICD-10-CM | POA: Diagnosis not present

## 2020-05-22 DIAGNOSIS — F0391 Unspecified dementia with behavioral disturbance: Secondary | ICD-10-CM | POA: Diagnosis not present

## 2020-05-22 DIAGNOSIS — J1282 Pneumonia due to coronavirus disease 2019: Secondary | ICD-10-CM | POA: Diagnosis not present

## 2020-05-23 DIAGNOSIS — J9601 Acute respiratory failure with hypoxia: Secondary | ICD-10-CM | POA: Diagnosis not present

## 2020-05-23 DIAGNOSIS — Z791 Long term (current) use of non-steroidal anti-inflammatories (NSAID): Secondary | ICD-10-CM | POA: Diagnosis not present

## 2020-05-23 DIAGNOSIS — F1011 Alcohol abuse, in remission: Secondary | ICD-10-CM | POA: Diagnosis not present

## 2020-05-23 DIAGNOSIS — Z7952 Long term (current) use of systemic steroids: Secondary | ICD-10-CM | POA: Diagnosis not present

## 2020-05-23 DIAGNOSIS — U071 COVID-19: Secondary | ICD-10-CM | POA: Diagnosis not present

## 2020-05-23 DIAGNOSIS — F0391 Unspecified dementia with behavioral disturbance: Secondary | ICD-10-CM | POA: Diagnosis not present

## 2020-05-23 DIAGNOSIS — J1282 Pneumonia due to coronavirus disease 2019: Secondary | ICD-10-CM | POA: Diagnosis not present

## 2020-05-23 DIAGNOSIS — K5792 Diverticulitis of intestine, part unspecified, without perforation or abscess without bleeding: Secondary | ICD-10-CM | POA: Diagnosis not present

## 2020-05-23 DIAGNOSIS — E669 Obesity, unspecified: Secondary | ICD-10-CM | POA: Diagnosis not present

## 2020-05-23 DIAGNOSIS — K429 Umbilical hernia without obstruction or gangrene: Secondary | ICD-10-CM | POA: Diagnosis not present

## 2020-05-23 DIAGNOSIS — Z9181 History of falling: Secondary | ICD-10-CM | POA: Diagnosis not present

## 2020-05-23 DIAGNOSIS — E785 Hyperlipidemia, unspecified: Secondary | ICD-10-CM | POA: Diagnosis not present

## 2020-05-26 DIAGNOSIS — J1282 Pneumonia due to coronavirus disease 2019: Secondary | ICD-10-CM | POA: Diagnosis not present

## 2020-05-26 DIAGNOSIS — U071 COVID-19: Secondary | ICD-10-CM | POA: Diagnosis not present

## 2020-05-26 DIAGNOSIS — J9601 Acute respiratory failure with hypoxia: Secondary | ICD-10-CM | POA: Diagnosis not present

## 2020-05-26 DIAGNOSIS — K5792 Diverticulitis of intestine, part unspecified, without perforation or abscess without bleeding: Secondary | ICD-10-CM | POA: Diagnosis not present

## 2020-05-26 DIAGNOSIS — F0391 Unspecified dementia with behavioral disturbance: Secondary | ICD-10-CM | POA: Diagnosis not present

## 2020-05-26 DIAGNOSIS — E785 Hyperlipidemia, unspecified: Secondary | ICD-10-CM | POA: Diagnosis not present

## 2020-05-30 DIAGNOSIS — U071 COVID-19: Secondary | ICD-10-CM | POA: Diagnosis not present

## 2020-05-30 DIAGNOSIS — E785 Hyperlipidemia, unspecified: Secondary | ICD-10-CM | POA: Diagnosis not present

## 2020-05-30 DIAGNOSIS — K5792 Diverticulitis of intestine, part unspecified, without perforation or abscess without bleeding: Secondary | ICD-10-CM | POA: Diagnosis not present

## 2020-05-30 DIAGNOSIS — J1282 Pneumonia due to coronavirus disease 2019: Secondary | ICD-10-CM | POA: Diagnosis not present

## 2020-05-30 DIAGNOSIS — J9601 Acute respiratory failure with hypoxia: Secondary | ICD-10-CM | POA: Diagnosis not present

## 2020-05-30 DIAGNOSIS — F0391 Unspecified dementia with behavioral disturbance: Secondary | ICD-10-CM | POA: Diagnosis not present

## 2020-06-03 DIAGNOSIS — F0391 Unspecified dementia with behavioral disturbance: Secondary | ICD-10-CM | POA: Diagnosis not present

## 2020-06-03 DIAGNOSIS — J9601 Acute respiratory failure with hypoxia: Secondary | ICD-10-CM | POA: Diagnosis not present

## 2020-06-03 DIAGNOSIS — E785 Hyperlipidemia, unspecified: Secondary | ICD-10-CM | POA: Diagnosis not present

## 2020-06-03 DIAGNOSIS — J1282 Pneumonia due to coronavirus disease 2019: Secondary | ICD-10-CM | POA: Diagnosis not present

## 2020-06-03 DIAGNOSIS — K5792 Diverticulitis of intestine, part unspecified, without perforation or abscess without bleeding: Secondary | ICD-10-CM | POA: Diagnosis not present

## 2020-06-03 DIAGNOSIS — U071 COVID-19: Secondary | ICD-10-CM | POA: Diagnosis not present

## 2020-06-05 DIAGNOSIS — F0151 Vascular dementia with behavioral disturbance: Secondary | ICD-10-CM | POA: Diagnosis not present

## 2020-06-05 DIAGNOSIS — E538 Deficiency of other specified B group vitamins: Secondary | ICD-10-CM | POA: Diagnosis not present

## 2020-06-05 DIAGNOSIS — G309 Alzheimer's disease, unspecified: Secondary | ICD-10-CM | POA: Diagnosis not present

## 2020-06-05 DIAGNOSIS — Z7189 Other specified counseling: Secondary | ICD-10-CM | POA: Diagnosis not present

## 2020-06-05 DIAGNOSIS — R2689 Other abnormalities of gait and mobility: Secondary | ICD-10-CM | POA: Diagnosis not present

## 2020-06-06 DIAGNOSIS — E785 Hyperlipidemia, unspecified: Secondary | ICD-10-CM | POA: Diagnosis not present

## 2020-06-06 DIAGNOSIS — F0391 Unspecified dementia with behavioral disturbance: Secondary | ICD-10-CM | POA: Diagnosis not present

## 2020-06-06 DIAGNOSIS — J1282 Pneumonia due to coronavirus disease 2019: Secondary | ICD-10-CM | POA: Diagnosis not present

## 2020-06-06 DIAGNOSIS — K5792 Diverticulitis of intestine, part unspecified, without perforation or abscess without bleeding: Secondary | ICD-10-CM | POA: Diagnosis not present

## 2020-06-06 DIAGNOSIS — J9601 Acute respiratory failure with hypoxia: Secondary | ICD-10-CM | POA: Diagnosis not present

## 2020-06-06 DIAGNOSIS — U071 COVID-19: Secondary | ICD-10-CM | POA: Diagnosis not present

## 2020-06-11 DIAGNOSIS — J9601 Acute respiratory failure with hypoxia: Secondary | ICD-10-CM | POA: Diagnosis not present

## 2020-06-11 DIAGNOSIS — E785 Hyperlipidemia, unspecified: Secondary | ICD-10-CM | POA: Diagnosis not present

## 2020-06-11 DIAGNOSIS — K5792 Diverticulitis of intestine, part unspecified, without perforation or abscess without bleeding: Secondary | ICD-10-CM | POA: Diagnosis not present

## 2020-06-11 DIAGNOSIS — J1282 Pneumonia due to coronavirus disease 2019: Secondary | ICD-10-CM | POA: Diagnosis not present

## 2020-06-11 DIAGNOSIS — U071 COVID-19: Secondary | ICD-10-CM | POA: Diagnosis not present

## 2020-06-11 DIAGNOSIS — F0391 Unspecified dementia with behavioral disturbance: Secondary | ICD-10-CM | POA: Diagnosis not present

## 2020-06-12 ENCOUNTER — Telehealth: Payer: Self-pay | Admitting: Primary Care

## 2020-06-12 NOTE — Telephone Encounter (Signed)
Called reviewed with patient. Requested that I call back when wife is up from nap to schedule with her.

## 2020-06-12 NOTE — Telephone Encounter (Addendum)
Joellen, I'm sending this to you as I believe you'll be able to explain this to the patient and his family better than our schedulers.  It appears that he needs to see me before November 17.    ----- Message from Meda Klinefelter sent at 06/12/2020 12:34 PM EST ----- Regarding: Home Health Certification Hello,   This patient has home health certification that is pending billing, however, they have not had a previous visit within 90 days of initial cert(05/18/20) Date or 30 days after, but you still have time if appt scheduled before the 17th.  It can be a virtual visit .  If not , I can delete the charge for home health cert.  Thanks, [employer]

## 2020-06-17 DIAGNOSIS — Z9181 History of falling: Secondary | ICD-10-CM | POA: Diagnosis not present

## 2020-06-17 DIAGNOSIS — U071 COVID-19: Secondary | ICD-10-CM | POA: Diagnosis not present

## 2020-06-17 DIAGNOSIS — E669 Obesity, unspecified: Secondary | ICD-10-CM | POA: Diagnosis not present

## 2020-06-17 DIAGNOSIS — F1011 Alcohol abuse, in remission: Secondary | ICD-10-CM | POA: Diagnosis not present

## 2020-06-17 DIAGNOSIS — E785 Hyperlipidemia, unspecified: Secondary | ICD-10-CM | POA: Diagnosis not present

## 2020-06-17 DIAGNOSIS — Z7952 Long term (current) use of systemic steroids: Secondary | ICD-10-CM | POA: Diagnosis not present

## 2020-06-17 DIAGNOSIS — K429 Umbilical hernia without obstruction or gangrene: Secondary | ICD-10-CM | POA: Diagnosis not present

## 2020-06-17 DIAGNOSIS — F0391 Unspecified dementia with behavioral disturbance: Secondary | ICD-10-CM | POA: Diagnosis not present

## 2020-06-17 DIAGNOSIS — Z791 Long term (current) use of non-steroidal anti-inflammatories (NSAID): Secondary | ICD-10-CM | POA: Diagnosis not present

## 2020-06-17 DIAGNOSIS — J9601 Acute respiratory failure with hypoxia: Secondary | ICD-10-CM | POA: Diagnosis not present

## 2020-06-17 DIAGNOSIS — J1282 Pneumonia due to coronavirus disease 2019: Secondary | ICD-10-CM | POA: Diagnosis not present

## 2020-06-17 DIAGNOSIS — K5792 Diverticulitis of intestine, part unspecified, without perforation or abscess without bleeding: Secondary | ICD-10-CM | POA: Diagnosis not present

## 2020-06-17 NOTE — Telephone Encounter (Signed)
Called patient app made in office for tomorrow.

## 2020-06-17 NOTE — Telephone Encounter (Signed)
Noted  

## 2020-06-18 ENCOUNTER — Other Ambulatory Visit: Payer: Self-pay

## 2020-06-18 ENCOUNTER — Encounter: Payer: Self-pay | Admitting: Primary Care

## 2020-06-18 ENCOUNTER — Ambulatory Visit (INDEPENDENT_AMBULATORY_CARE_PROVIDER_SITE_OTHER): Payer: Medicare Other | Admitting: Primary Care

## 2020-06-18 VITALS — BP 126/82 | HR 82 | Temp 99.5°F | Ht 68.0 in | Wt 195.0 lb

## 2020-06-18 DIAGNOSIS — U071 Acute respiratory failure with hypoxia: Secondary | ICD-10-CM

## 2020-06-18 DIAGNOSIS — G309 Alzheimer's disease, unspecified: Secondary | ICD-10-CM | POA: Diagnosis not present

## 2020-06-18 DIAGNOSIS — F0151 Vascular dementia with behavioral disturbance: Secondary | ICD-10-CM

## 2020-06-18 DIAGNOSIS — J9601 Acute respiratory failure with hypoxia: Secondary | ICD-10-CM

## 2020-06-18 DIAGNOSIS — E785 Hyperlipidemia, unspecified: Secondary | ICD-10-CM

## 2020-06-18 DIAGNOSIS — B351 Tinea unguium: Secondary | ICD-10-CM | POA: Diagnosis not present

## 2020-06-18 DIAGNOSIS — R739 Hyperglycemia, unspecified: Secondary | ICD-10-CM

## 2020-06-18 DIAGNOSIS — E538 Deficiency of other specified B group vitamins: Secondary | ICD-10-CM

## 2020-06-18 LAB — CBC
HCT: 40.5 % (ref 39.0–52.0)
Hemoglobin: 13.7 g/dL (ref 13.0–17.0)
MCHC: 33.9 g/dL (ref 30.0–36.0)
MCV: 105.5 fl — ABNORMAL HIGH (ref 78.0–100.0)
Platelets: 272 10*3/uL (ref 150.0–400.0)
RBC: 3.84 Mil/uL — ABNORMAL LOW (ref 4.22–5.81)
RDW: 14.5 % (ref 11.5–15.5)
WBC: 9.6 10*3/uL (ref 4.0–10.5)

## 2020-06-18 LAB — COMPREHENSIVE METABOLIC PANEL
ALT: 14 U/L (ref 0–53)
AST: 19 U/L (ref 0–37)
Albumin: 3.7 g/dL (ref 3.5–5.2)
Alkaline Phosphatase: 53 U/L (ref 39–117)
BUN: 9 mg/dL (ref 6–23)
CO2: 29 mEq/L (ref 19–32)
Calcium: 8.9 mg/dL (ref 8.4–10.5)
Chloride: 103 mEq/L (ref 96–112)
Creatinine, Ser: 0.93 mg/dL (ref 0.40–1.50)
GFR: 81.65 mL/min (ref >60.00)
Glucose, Bld: 91 mg/dL (ref 70–99)
Potassium: 4 mEq/L (ref 3.5–5.1)
Sodium: 139 mEq/L (ref 135–145)
Total Bilirubin: 0.4 mg/dL (ref 0.2–1.2)
Total Protein: 6.9 g/dL (ref 6.0–8.3)

## 2020-06-18 LAB — HEMOGLOBIN A1C: Hgb A1c MFr Bld: 6.2 % (ref 4.6–6.5)

## 2020-06-18 LAB — LIPID PANEL
Cholesterol: 107 mg/dL (ref 0–200)
HDL: 30.4 mg/dL — ABNORMAL LOW (ref >39.00)
LDL Cholesterol: 57 mg/dL (ref 0–99)
NonHDL: 76.57
Total CHOL/HDL Ratio: 4
Triglycerides: 97 mg/dL (ref 0.0–149.0)
VLDL: 19.4 mg/dL (ref 0.0–40.0)

## 2020-06-18 LAB — VITAMIN B12: Vitamin B-12: 593 pg/mL (ref 211–911)

## 2020-06-18 NOTE — Patient Instructions (Addendum)
Stop by the lab prior to leaving today. I will notify you of your results once received.   It was a pleasure to see you today!  

## 2020-06-18 NOTE — Assessment & Plan Note (Signed)
Compliant oral B12, repeat labs pending.

## 2020-06-18 NOTE — Assessment & Plan Note (Signed)
Significantly improved with resolve since hospitalization. Continue home health PT for as long as needed. Exam today unremarkable.  Hospital notes, labs, imaging reviewed. Repeat labs pending.

## 2020-06-18 NOTE — Progress Notes (Signed)
Subjective:    Patient ID: Kurt Bailey, male    DOB: 1945-12-03, 74 y.o.   MRN: [id number]  HPI  This visit occurred during the SARS-CoV-2 public health emergency.  Safety protocols were in place, including screening questions prior to the visit, additional usage of staff PPE, and extensive cleaning of exam room while observing appropriate contact time as indicated for disinfecting solutions.   Kurt Bailey is a 74 year old male with a history of Covid-19 infection, memory changes, vitamin B12 deficiency, hyperlipidemia who presents today for follow up and re-certification for home health.  1) Covid-19 Infection: He presented to [facility] ED on 05/13/20 with a 10-day history of persistent fever, chills, fatigue, cough.  He was diagnosed with COVID-19 on 05/03/2020. He has been trying to treat his symptoms at home, but unfortunately continued to decline.  He did undergo monoclonal antibody treatment on 05/11/2020.    During his initial presentation to the ED his oxygen saturation was as low as 85 on room air.  Work-up consistent for multifocal pneumonia and dehydration.  His D-dimer was quite elevated, but CTA chest was negative for PE.  He was admitted for further evaluation and treatment for COVID-19 infection and acute hypoxia.  During his hospital stay he continued to remain hypoxic without supplemental oxygen.  He was treated with remdesivir, steroids, baricitinib.  Oxygen saturation improved gradually, was able to wean off of supplemental oxygen.  He was discharged home on 05/16/2020 with recommendations for 1 final remdesivir treatment, repeat CMP with LFTs at follow-up visit, recommendations for COVID-19 vaccination, oral steroids x10 days, home health physical therapy and nursing.  Today he is feeling much better, symptoms have resolved. He's been working with home health PT for endurance and strength, has progressed tremendously. Pulse oxymetry is "good" when measured by physical therapy.   He will likely get the COVID-19 vaccine in early 2022.  2) Memory Changes: Following with Neurology, Dr. Sherryll Burger through [facility], and is managed on memantine 10 mg BID, also Depakote ER 250 mg.  Last visit was 06/05/2020, no changes made to regimen.  3) Onychomycosis: Chronic and recurrent.  Currently following with dermatology through [facility] who has now moved to [facility].  Previously managed on terbinafine, would like to resume but does not prefer to drive 27+ miles to [facility].  He would like to see a dermatologist locally in the [place] area.  BP Readings from Last 3 Encounters:  06/18/20 126/82  05/17/20 (!) 110/99  05/16/20 (!) 126/91     Review of Systems  Constitutional: Negative for fatigue and fever.  Respiratory: Negative for shortness of breath.   Cardiovascular: Negative for chest pain.  Gastrointestinal: Negative for diarrhea.  Skin:       Onychomycosis  Neurological: Negative for headaches.       Past Medical History:  Diagnosis Date  . Alcohol abuse   . Diverticulitis   . Periumbilical hernia      Social History   Socioeconomic History  . Marital status: Married    Spouse name: Not on file  . Number of children: Not on file  . Years of education: Not on file  . Highest education level: Not on file  Occupational History  . Not on file  Tobacco Use  . Smoking status: Never Smoker  . Smokeless tobacco: Never Used  Substance and Sexual Activity  . Alcohol use: Not Currently  . Drug use: Not on file  . Sexual activity: Not on file  Other Topics Concern  .  Not on file  Social History Narrative   Married.   4 children.   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   Enjoys relaxing, walking.   Social Determinants of Health   Financial Resource Strain:   . Difficulty of Paying Living Expenses: Not on file  Food Insecurity:   . Worried About Programme researcher, broadcasting/film/video in the Last Year: Not on file  . Ran Out of Food in the Last Year: Not on file  Transportation Needs:     . Lack of Transportation (Medical): Not on file  . Lack of Transportation (Non-Medical): Not on file  Physical Activity:   . Days of Exercise per Week: Not on file  . Minutes of Exercise per Session: Not on file  Stress:   . Feeling of Stress : Not on file  Social Connections:   . Frequency of Communication with Friends and Family: Not on file  . Frequency of Social Gatherings with Friends and Family: Not on file  . Attends Religious Services: Not on file  . Active Member of Clubs or Organizations: Not on file  . Attends Banker Meetings: Not on file  . Marital Status: Not on file  Intimate Partner Violence:   . Fear of Current or Ex-Partner: Not on file  . Emotionally Abused: Not on file  . Physically Abused: Not on file  . Sexually Abused: Not on file    History reviewed. No pertinent surgical history.  Family History  Problem Relation Age of Onset  . Alcohol abuse Father   . Stroke Father   . Hypertension Father   . Emphysema Father   . Breast cancer Sister   . Heart attack Brother   . Aneurysm Sister   . Stroke Brother   . Hyperlipidemia Brother     No Known Allergies  Current Outpatient Medications on File Prior to Visit  Medication Sig Dispense Refill  . acetaminophen (TYLENOL) 500 MG tablet Take 500 mg by mouth every 6 (six) hours as needed.    Marland Kitchen atorvastatin (LIPITOR) 40 MG tablet Take 1 tablet (40 mg total) by mouth every evening. For cholesterol. 90 tablet 3  . benzonatate (TESSALON) 200 MG capsule Take 200 mg by mouth 3 (three) times daily as needed.    . divalproex (DEPAKOTE) 250 MG DR tablet Take 125 mg by mouth 2 (two) times daily.     . Omega-3 1000 MG CAPS Take 1,000 mg by mouth every evening.     . vitamin B-12 (CYANOCOBALAMIN) 1000 MCG tablet Take 1,000 mcg by mouth every evening.     . memantine (NAMENDA) 10 MG tablet Take 10 mg by mouth 2 (two) times daily.      No current facility-administered medications on file prior to visit.     BP 126/82   Pulse 82   Temp 99.5 F (37.5 C) (Temporal)   Ht 5\' 8"  (1.727 m)   Wt 195 lb (88.5 kg)   SpO2 95%   BMI 29.65 kg/m    Objective:   Physical Exam Cardiovascular:     Rate and Rhythm: Normal rate and regular rhythm.  Pulmonary:     Effort: Pulmonary effort is normal.     Breath sounds: Normal breath sounds.  Musculoskeletal:     Cervical back: Neck supple.  Skin:    General: Skin is warm and dry.  Neurological:     Mental Status: He is alert.     Comments: Follows commands appropriately.  Psychiatric:  Mood and Affect: Mood normal.            Assessment & Plan:

## 2020-06-18 NOTE — Assessment & Plan Note (Signed)
Compliant to atorvastatin, continue 40 mg daily. Repeat lipids pending.

## 2020-06-18 NOTE — Assessment & Plan Note (Signed)
Following with neurology through [facility]. No changes to regimen as of recent visit.  Continue memantine 10 mg twice daily, Depakote 250 mg 2 times daily.

## 2020-07-02 ENCOUNTER — Other Ambulatory Visit: Payer: Self-pay | Admitting: Primary Care

## 2020-07-02 DIAGNOSIS — E785 Hyperlipidemia, unspecified: Secondary | ICD-10-CM

## 2020-09-23 ENCOUNTER — Telehealth: Payer: Self-pay

## 2020-09-23 NOTE — Telephone Encounter (Signed)
Volunteer called patient on behalf of Palliative Care and did not get a answer from patient/family. ° °

## 2020-10-07 ENCOUNTER — Other Ambulatory Visit: Payer: Self-pay | Admitting: Primary Care

## 2020-10-07 DIAGNOSIS — E785 Hyperlipidemia, unspecified: Secondary | ICD-10-CM

## 2020-12-17 ENCOUNTER — Telehealth: Payer: Self-pay

## 2020-12-17 NOTE — Telephone Encounter (Signed)
Volunteer called patient/family on behalf of [facility]. Patient is doing well at this time.  

## 2021-01-05 DIAGNOSIS — Z79899 Other long term (current) drug therapy: Secondary | ICD-10-CM | POA: Diagnosis not present

## 2021-03-22 DIAGNOSIS — Z20822 Contact with and (suspected) exposure to covid-19: Secondary | ICD-10-CM | POA: Diagnosis not present

## 2021-04-01 ENCOUNTER — Encounter: Payer: Self-pay | Admitting: Primary Care

## 2021-04-01 ENCOUNTER — Other Ambulatory Visit: Payer: Self-pay

## 2021-04-01 ENCOUNTER — Ambulatory Visit (INDEPENDENT_AMBULATORY_CARE_PROVIDER_SITE_OTHER): Payer: Medicare Other | Admitting: Primary Care

## 2021-04-01 VITALS — BP 130/84 | HR 89 | Temp 98.2°F | Ht 68.0 in | Wt 200.0 lb

## 2021-04-01 DIAGNOSIS — E538 Deficiency of other specified B group vitamins: Secondary | ICD-10-CM

## 2021-04-01 DIAGNOSIS — F02818 Dementia in other diseases classified elsewhere, unspecified severity, with other behavioral disturbance: Secondary | ICD-10-CM

## 2021-04-01 DIAGNOSIS — B351 Tinea unguium: Secondary | ICD-10-CM | POA: Insufficient documentation

## 2021-04-01 DIAGNOSIS — G309 Alzheimer's disease, unspecified: Secondary | ICD-10-CM | POA: Diagnosis not present

## 2021-04-01 DIAGNOSIS — E785 Hyperlipidemia, unspecified: Secondary | ICD-10-CM

## 2021-04-01 DIAGNOSIS — R7303 Prediabetes: Secondary | ICD-10-CM | POA: Diagnosis not present

## 2021-04-01 DIAGNOSIS — F0151 Vascular dementia with behavioral disturbance: Secondary | ICD-10-CM | POA: Diagnosis not present

## 2021-04-01 LAB — LIPID PANEL
Cholesterol: 104 mg/dL (ref 0–200)
HDL: 28.7 mg/dL — ABNORMAL LOW (ref >39.00)
LDL Cholesterol: 54 mg/dL (ref 0–99)
NonHDL: 75.67
Total CHOL/HDL Ratio: 4
Triglycerides: 106 mg/dL (ref 0.0–149.0)
VLDL: 21.2 mg/dL (ref 0.0–40.0)

## 2021-04-01 NOTE — Progress Notes (Signed)
Subjective:    Patient ID: Kurt Bailey, male    DOB: 1946/02/16, 75 y.o.   MRN: [id number]  HPI  Kurt Bailey is a very pleasant 75 y.o. 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 Following with neurology and is managed on memantine 10 mg BID and Depakote 1000 mg BID. Last visit was in June 2022 with Dr. Sherryll Burger, recommendation was for PT for imbalance, continue Vitamin B12, continue Namenda 10 mg BID. Labs collected during this visit.   He continues to drive during the daytime, usually with his wife. During his last neurology visit it was recommended that he avoid driving.   Overall feeling well, good appetite. His wife is trying to   2) Hyperlipidemia: Currently managed on atorvastatin 40 mg, compliant daily. Due for repeat lipid panel today.  3) Onychomycosis: Chronic, to fingernails, right lower extremity, and toenails, previously following with dermatology through [facility] in [place]. Managed on terbinafine 250 mg His dermatologist moved to [facility] and it is too far of a drive.    BP Readings from Last 3 Encounters:  04/01/21 130/84  06/18/20 126/82  05/17/20 (!) 110/99      Review of Systems  Respiratory:  Negative for shortness of breath.   Cardiovascular:  Negative for chest pain.  Skin:        Onychomycosis   Neurological:  Negative for dizziness.       Chronic decline in memory        Past Medical History:  Diagnosis Date   Alcohol abuse    Diverticulitis    Periumbilical hernia     Social History   Socioeconomic History   Marital status: Married    Spouse name: Not on file   Number of children: Not on file   Years of education: Not on file   Highest education level: Not on file  Occupational History   Not on file  Tobacco Use    Smoking status: Never   Smokeless tobacco: Never  Substance and Sexual Activity   Alcohol use: Not Currently   Drug use: Not on file   Sexual activity: Not on file  Other Topics Concern   Not on file  Social History Narrative   Married.   4 children.   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   Enjoys relaxing, walking.   Social Determinants of Health   Financial Resource Strain: Not on file  Food Insecurity: Not on file  Transportation Needs: Not on file  Physical Activity: Not on file  Stress: Not on file  Social Connections: Not on file  Intimate Partner Violence: Not on file    History reviewed. No pertinent surgical history.  Family History  Problem Relation Age of Onset   Alcohol abuse Father    Stroke Father    Hypertension Father    Emphysema Father    Breast cancer Sister    Heart attack Brother    Aneurysm Sister    Stroke Brother    Hyperlipidemia Brother     No Known Allergies  Current Outpatient Medications on File Prior to Visit  Medication Sig Dispense Refill   acetaminophen (TYLENOL) 500 MG tablet Take 500 mg by mouth every 6 (six) hours as needed.     atorvastatin (LIPITOR) 40 MG tablet TAKE 1 TABLET BY MOUTH ONCE DAILY IN THE EVENING  FOR CHOLESTEROL 90 tablet 1   cyanocobalamin 1000 MCG tablet Take by mouth.     divalproex (DEPAKOTE) 500 MG DR tablet Take by mouth.     memantine (NAMENDA) 10 MG tablet Take by mouth.     Omega-3 1000 MG CAPS Take 1,000 mg by mouth every evening.      vitamin B-12 (CYANOCOBALAMIN) 1000 MCG tablet Take 1,000 mcg by mouth every evening.      memantine (NAMENDA) 10 MG tablet Take 10 mg by mouth 2 (two) times daily.      terbinafine (LAMISIL) 250 MG tablet Take one tablet by mouth daily for 90 days. (Patient not taking: Reported on 04/01/2021)     No current facility-administered medications on file prior to visit.    BP 130/84   Pulse 89   Temp 98.2 F (36.8 C) (Temporal)   Ht 5\' 8"  (1.727 m)   Wt 200 lb (90.7 kg)    SpO2 96%   BMI 30.41 kg/m  Objective:   Physical Exam Cardiovascular:     Rate and Rhythm: Normal rate and regular rhythm.  Pulmonary:     Effort: Pulmonary effort is normal.     Breath sounds: Normal breath sounds. No wheezing or rales.  Musculoskeletal:     Cervical back: Neck supple.  Skin:    General: Skin is warm and dry.     Comments: Fungal involvement noted to fingernails of left hand. No rash noted to right lower extremity.  Neurological:     Mental Status: He is alert and oriented to person, place, and time.          Assessment & Plan:      This visit occurred during the SARS-CoV-2 public health emergency.  Safety protocols were in place, including screening questions prior to the visit, additional usage of staff PPE, and extensive cleaning of exam room while observing appropriate contact time as indicated for disinfecting solutions.

## 2021-04-01 NOTE — Progress Notes (Signed)
Established Patient Office Visit  Subjective:  Patient ID: Kurt Bailey, male    DOB: 07/18/1946  Age: 75 y.o. MRN: [id number]  CC: No chief complaint on file.   HPI  Kurt Bailey is a 75 year old male with a past medical history including Mixed Alzheimer's disease with vascular dementia and behavioral disturbance, Vitamin B deficiency, and hyperlipidemia presents for medication follow up.  He is followed by Dr. Sherryll Burger with [facility] for attention deficit with associated memory loss and imbalance. He is currently taking Depakote 500 mg DR tablets two times daily and Memantidine 10 mg two times daily per their recommendations. He was last seen by Dr. Sherryll Burger on January 04, 2021 during which the Depakote dose was increased to 500 mg twice daily from previously prescribed 250 twice daily.  Today we discussed the following: Memory Changes: Per the patient and his wife report, no changes since last seen by Dr. Sherryll Burger. He is still driving during the day only, with some concern voiced by his wife about this. He was advised not to drive by Dr. Sherryll Burger. Hyperlipidemia: He takes 40 mg Atorvastatin daily. No side effects reported. His last lipid panel was 11/21. Will repeat today B12 deficiency: normal 03/04/21, 689 pg/mL. He takes 1000 mcg daily.   He feels generally well. Walks regularly around his yard.  Appetite is good, drinks approximately 1-2 glasses of water per day. He otherwise drinks cola or tea throughout the day.    His wife is requesting a refill for Terbinafine for onychomycosis. He still has evidence of this on his first, second, and fourth digits on the left hand.    BP Readings from Last 3 Encounters:  04/01/21 130/84  06/18/20 126/82  05/17/20 (!) 110/99    Past Medical History:  Diagnosis Date   Alcohol abuse    Diverticulitis    Periumbilical hernia     No past surgical history on file.  Family History  Problem Relation Age of Onset   Alcohol abuse Father     Stroke Father    Hypertension Father    Emphysema Father    Breast cancer Sister    Heart attack Brother    Aneurysm Sister    Stroke Brother    Hyperlipidemia Brother     Social History   Socioeconomic History   Marital status: Married    Spouse name: Not on file   Number of children: Not on file   Years of education: Not on file   Highest education level: Not on file  Occupational History   Not on file  Tobacco Use   Smoking status: Never   Smokeless tobacco: Never  Substance and Sexual Activity   Alcohol use: Not Currently   Drug use: Not on file   Sexual activity: Not on file  Other Topics Concern   Not on file  Social History Narrative   Married.   4 children.   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   Enjoys relaxing, walking.   Social Determinants of Health   Financial Resource Strain: Not on file  Food Insecurity: Not on file  Transportation Needs: Not on file  Physical Activity: Not on file  Stress: Not on file  Social Connections: Not on file  Intimate Partner Violence: Not on file    Outpatient Medications Prior to Visit  Medication Sig Dispense Refill   acetaminophen (TYLENOL) 500 MG tablet Take 500 mg by mouth every 6 (six) hours as needed.  atorvastatin (LIPITOR) 40 MG tablet TAKE 1 TABLET BY MOUTH ONCE DAILY IN THE EVENING FOR CHOLESTEROL 90 tablet 1   benzonatate (TESSALON) 200 MG capsule Take 200 mg by mouth 3 (three) times daily as needed.     divalproex (DEPAKOTE) 250 MG DR tablet Take 125 mg by mouth 2 (two) times daily.      memantine (NAMENDA) 10 MG tablet Take 10 mg by mouth 2 (two) times daily.      Omega-3 1000 MG CAPS Take 1,000 mg by mouth every evening.      vitamin B-12 (CYANOCOBALAMIN) 1000 MCG tablet Take 1,000 mcg by mouth every evening.      No facility-administered medications prior to visit.    No Known Allergies  ROS Review of Systems  Constitutional: Negative.   Respiratory: Negative.    Cardiovascular: Negative.    Gastrointestinal: Negative.   Genitourinary: Negative.   Skin:        Onychomychosis present on left hand, digits 1, 2, and 4  Neurological:  Positive for dizziness.       Dizzines with abrupt positional changes   Psychiatric/Behavioral: Negative.       Objective:    Physical Exam Constitutional:      Appearance: Normal appearance.  Cardiovascular:     Rate and Rhythm: Normal rate and regular rhythm.  Pulmonary:     Breath sounds: Normal breath sounds.  Skin:    General: Skin is warm and dry.     Comments: Persistent onychomycosis to left hand (digits 1,2 and 4)    Neurological:     General: No focal deficit present.     Mental Status: He is alert. Mental status is at baseline.  Psychiatric:        Mood and Affect: Mood normal.        Thought Content: Thought content normal.    There were no vitals taken for this visit. Wt Readings from Last 3 Encounters:  06/18/20 195 lb (88.5 kg)  05/13/20 210 lb (95.3 kg)  07/04/19 205 lb (93 kg)     Health Maintenance Due  Topic Date Due   COVID-19 Vaccine (1) Never done   TETANUS/TDAP  Never done   Zoster Vaccines- Shingrix (1 of 2) Never done   COLONOSCOPY (Pts 45-56yrs Insurance coverage will need to be confirmed)  Never done   PNA vac Low Risk Adult (1 of 2 - PCV13) Never done   INFLUENZA VACCINE  03/02/2021    There are no preventive care reminders to display for this patient.  Lab Results  Component Value Date   TSH 3.18 09/19/2018   Lab Results  Component Value Date   WBC 9.6 06/18/2020   HGB 13.7 06/18/2020   HCT 40.5 06/18/2020   MCV 105.5 (H) 06/18/2020   PLT 272.0 06/18/2020   Lab Results  Component Value Date   NA 139 06/18/2020   K 4.0 06/18/2020   CO2 29 06/18/2020   GLUCOSE 91 06/18/2020   BUN 9 06/18/2020   CREATININE 0.93 06/18/2020   BILITOT 0.4 06/18/2020   ALKPHOS 53 06/18/2020   AST 19 06/18/2020   ALT 14 06/18/2020   PROT 6.9 06/18/2020   ALBUMIN 3.7 06/18/2020   CALCIUM 8.9  06/18/2020   ANIONGAP 9 05/15/2020   GFR 81.65 06/18/2020   Lab Results  Component Value Date   CHOL 107 06/18/2020   Lab Results  Component Value Date   HDL 30.40 (L) 06/18/2020   Lab Results  Component Value  Date   LDLCALC 57 06/18/2020   Lab Results  Component Value Date   TRIG 97.0 06/18/2020   Lab Results  Component Value Date   CHOLHDL 4 06/18/2020   Lab Results  Component Value Date   HGBA1C 6.2 06/18/2020      Assessment & Plan:   Problem List Items Addressed This Visit   None   No orders of the defined types were placed in this encounter.   Follow-up: No follow-ups on file.    Devoria Glassing, RN

## 2021-04-01 NOTE — Patient Instructions (Addendum)
It was nice to see you today!  Stop by the lab prior to leaving today. I will notify you of your results once received.   We have sent a referral to a dermatologist in [facility] for the fungus on your fingernails and toenails.

## 2021-04-01 NOTE — Assessment & Plan Note (Signed)
Persistent evidence of this on left hand, toenails, and RLE.   Referral placed for dermatology to evaluate for need to resume terbinafine.   I evaluated patient, was consulted regarding treatment, and agree with assessment and plan per Kathaleen Maser, RN, DNP student.   Mayra Reel, NP-C

## 2021-04-01 NOTE — Assessment & Plan Note (Addendum)
Compliant with oral B12, 1000 mcg daily. Continue same.  I evaluated patient, was consulted regarding treatment, and agree with assessment and plan per Kathaleen Maser, RN, DNP student.   Mayra Reel, NP-C

## 2021-04-01 NOTE — Assessment & Plan Note (Addendum)
Compliant to Atorvastatin, continue 40 mg daily. Repeat lipid panel today, pending.  I evaluated patient, was consulted regarding treatment, and agree with assessment and plan per Kathaleen Maser, RN, DNP student.   Mayra Reel, NP-C

## 2021-04-01 NOTE — Assessment & Plan Note (Addendum)
Following with Dr. Sherryll Burger through [facility].  Continue Depakote 500 mg DR tablets twice daily and Memantidine 10 mg twice daily.  I evaluated patient, was consulted regarding treatment, and agree with assessment and plan per Kathaleen Maser, RN, DNP student.   Mayra Reel, NP-C

## 2021-04-01 NOTE — Assessment & Plan Note (Signed)
Repeat A1C pending. 

## 2021-04-02 ENCOUNTER — Other Ambulatory Visit: Payer: Self-pay | Admitting: Primary Care

## 2021-04-02 DIAGNOSIS — E785 Hyperlipidemia, unspecified: Secondary | ICD-10-CM

## 2021-05-01 ENCOUNTER — Telehealth: Payer: Self-pay | Admitting: Primary Care

## 2021-05-01 NOTE — Telephone Encounter (Signed)
Called woman answered something was wrong with phone line and call was disconnected.

## 2021-05-01 NOTE — Telephone Encounter (Signed)
Pt wife called in requesting a call back with lab result #[phone]

## 2021-05-04 ENCOUNTER — Telehealth: Payer: Self-pay | Admitting: Primary Care

## 2021-05-04 DIAGNOSIS — R7303 Prediabetes: Secondary | ICD-10-CM

## 2021-05-04 NOTE — Telephone Encounter (Signed)
Pt has a lab appt 05/05/2021, there are no orders in. 

## 2021-05-04 NOTE — Telephone Encounter (Signed)
See lab results notes no further action needed on this message.

## 2021-05-04 NOTE — Telephone Encounter (Signed)
Have called all numbers in chart to call office.  Home was just busy signal  Cell l/m to call

## 2021-05-05 ENCOUNTER — Other Ambulatory Visit: Payer: Self-pay

## 2021-05-05 ENCOUNTER — Other Ambulatory Visit (INDEPENDENT_AMBULATORY_CARE_PROVIDER_SITE_OTHER): Payer: Medicare Other

## 2021-05-05 DIAGNOSIS — R7303 Prediabetes: Secondary | ICD-10-CM

## 2021-05-05 LAB — POCT GLYCOSYLATED HEMOGLOBIN (HGB A1C): Hemoglobin A1C: 5.7 % — AB (ref 4.0–5.6)

## 2021-05-05 NOTE — Telephone Encounter (Signed)
It looks like I entered labs on 04/01/21 but they have since expired as we could not get a hold of patient. New lab order placed.

## 2021-05-05 NOTE — Addendum Note (Signed)
Addended by: Doreene Nest on: 05/05/2021 07:11 AM   Modules accepted: Orders

## 2021-09-09 DIAGNOSIS — Z20822 Contact with and (suspected) exposure to covid-19: Secondary | ICD-10-CM | POA: Diagnosis not present

## 2021-09-24 ENCOUNTER — Telehealth: Payer: Self-pay

## 2021-09-24 IMAGING — CR DG CHEST 2V
2 series · 2 of 2 positions shown · non-contrast
Comparison: 03/13/2014

CLINICAL DATA: Fever, COVID positive

EXAM:
CHEST - 2 VIEW

[chest pa]
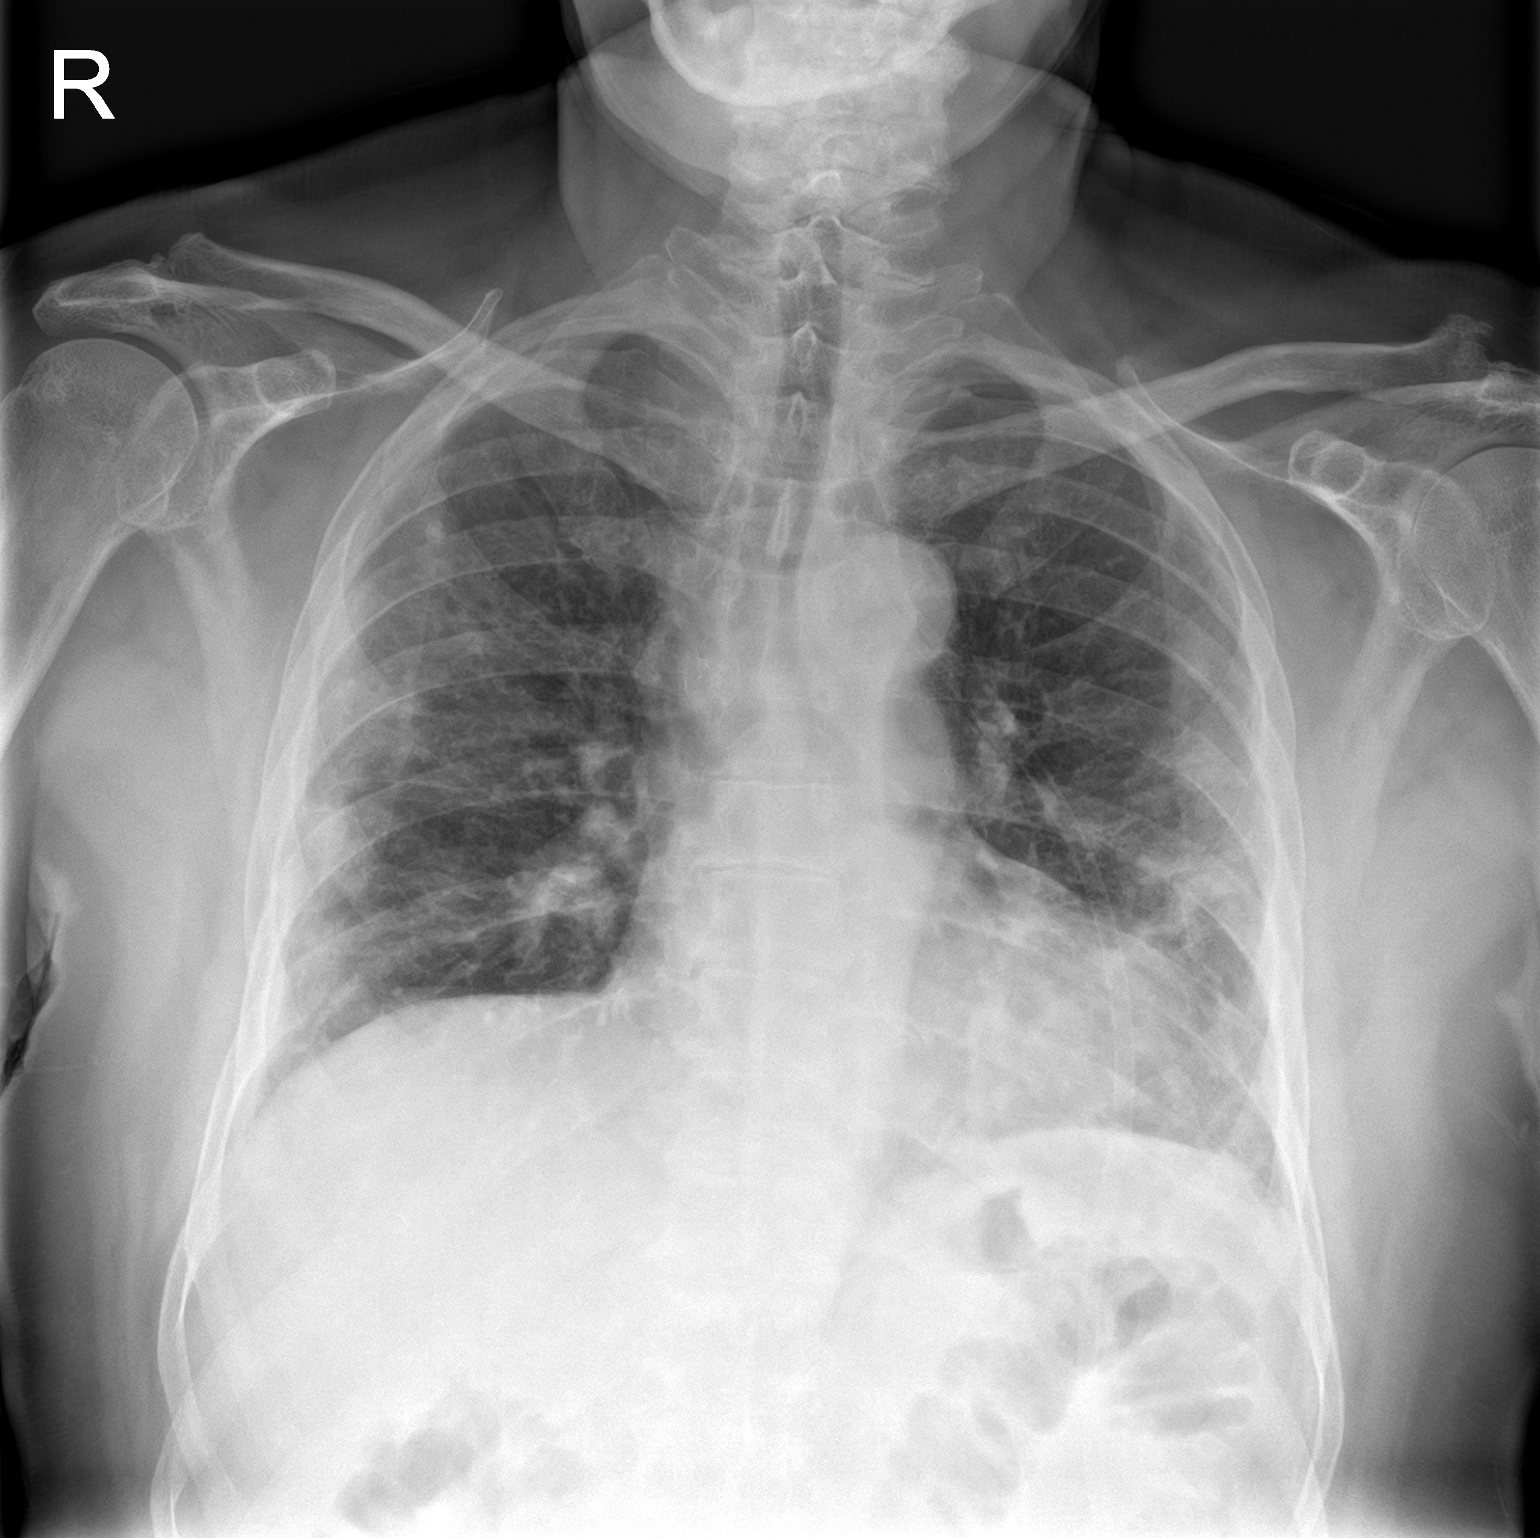

[chest lat]
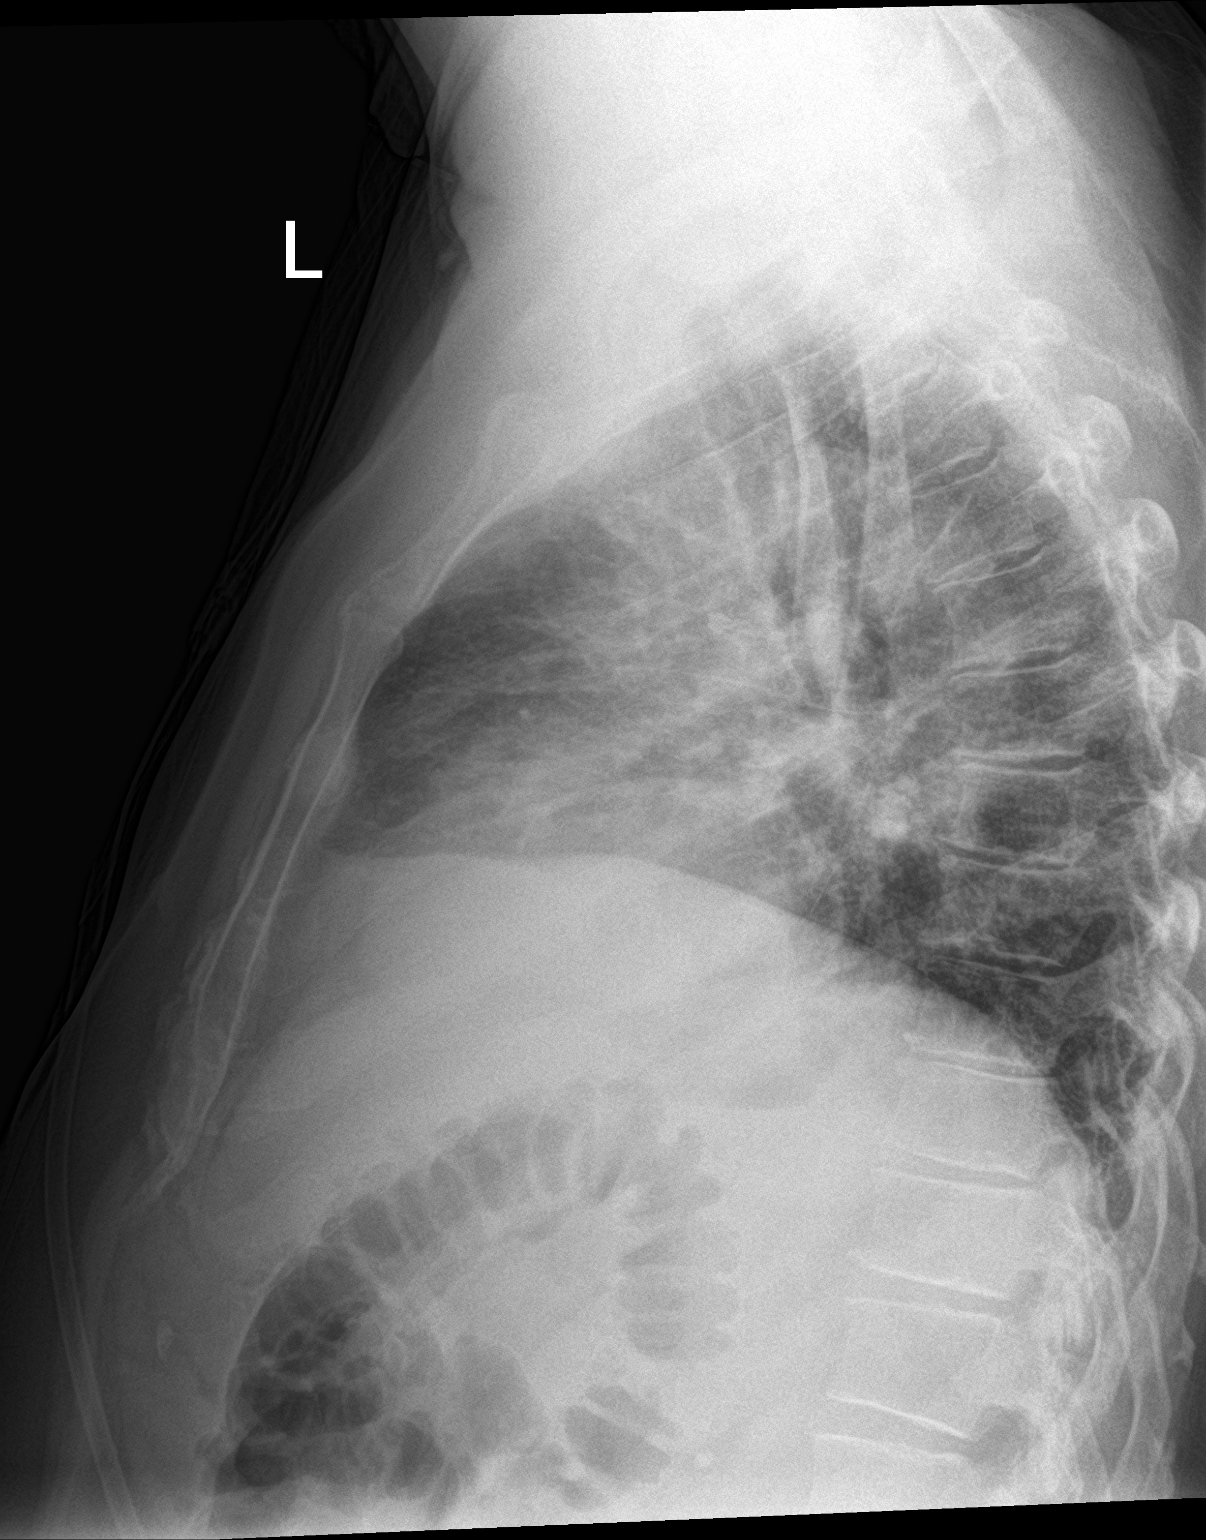

[2 of 2 positions shown; findings below may reference images not displayed]

FINDINGS: Lung volumes are small, however, pulmonary insufflation is symmetric
and stable since prior examination. There has developed moderate
bilateral peripheral mid and lower lung zone predominant airspace
infiltrates, likely infectious or inflammatory in the acute setting
and compatible with the given history of COVID pneumonia. No
pneumothorax or pleural effusion. Cardiac size within normal limits.
Pulmonary vascularity is normal. No acute bone abnormality.
IMPRESSION: Multifocal pulmonary infiltrates, likely infectious or inflammatory
in nature.

## 2021-09-24 IMAGING — CT CT ANGIO CHEST
2 of 6 series · 18 of 46 positions shown · IV contrast (APPLIED)
Comparison: Chest x-ray from earlier in the same day.

CLINICAL DATA: History of 1FW9N-ZE positivity with worsening
shortness of breath and hypoxia

EXAM:
CT ANGIOGRAPHY CHEST WITH CONTRAST
TECHNIQUE: Multidetector CT imaging of the chest was performed using the
standard protocol during bolus administration of intravenous
contrast. Multiplanar CT image reconstructions and MIPs were
obtained to evaluate the vascular anatomy.
CONTRAST:  75mL OMNIPAQUE IOHEXOL 350 MG/ML SOLN

[Series 5: thins · axial · 0.75mm/px · z∈[-634,-385]mm · 15 of 273 slices shown]
[im 12/273  lung]
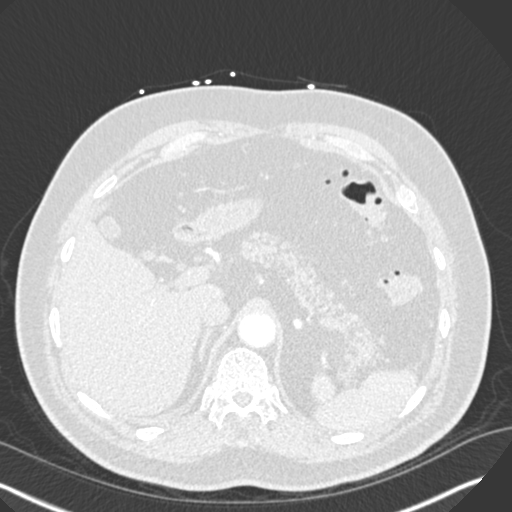
[im 36/273  soft-tissue]
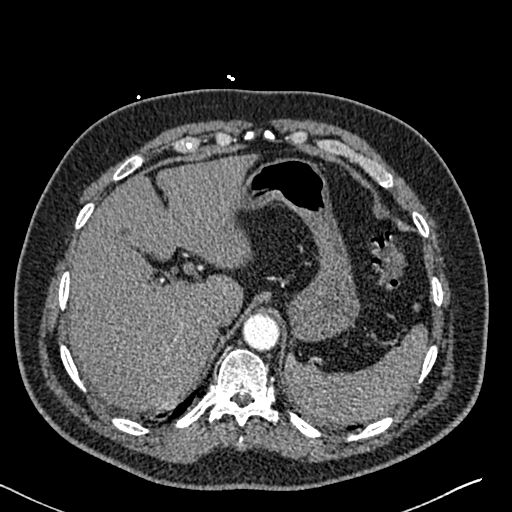
[im 48/273  lung]
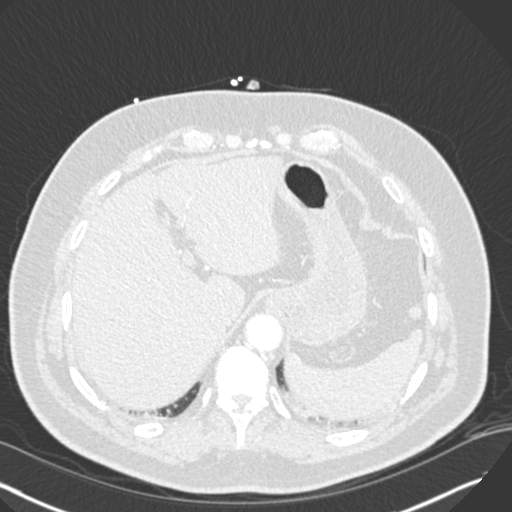
[im 71/273  soft-tissue]
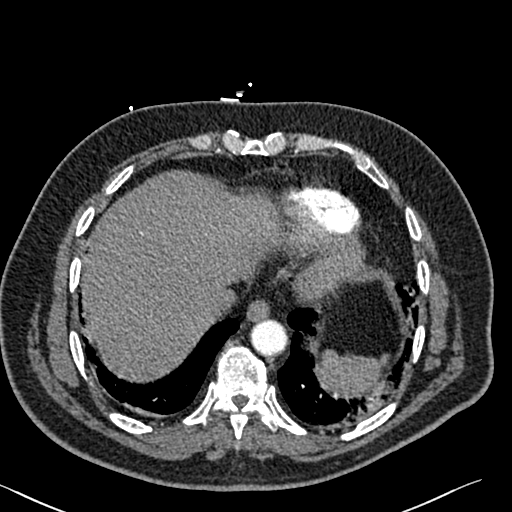
[im 83/273  lung]
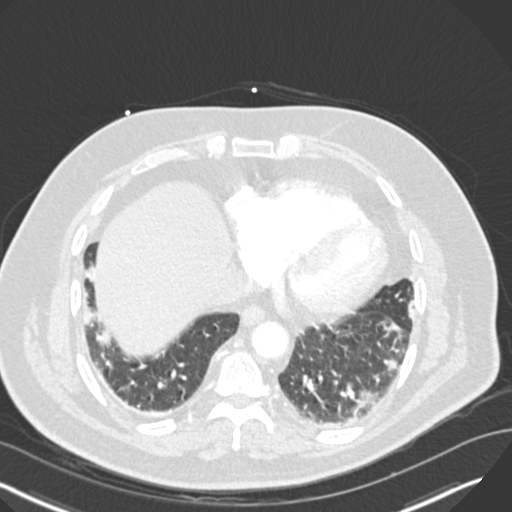
[im 107/273  soft-tissue]
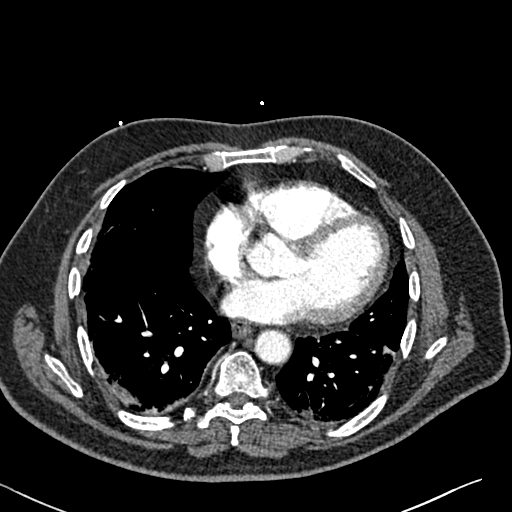
[im 119/273  lung]
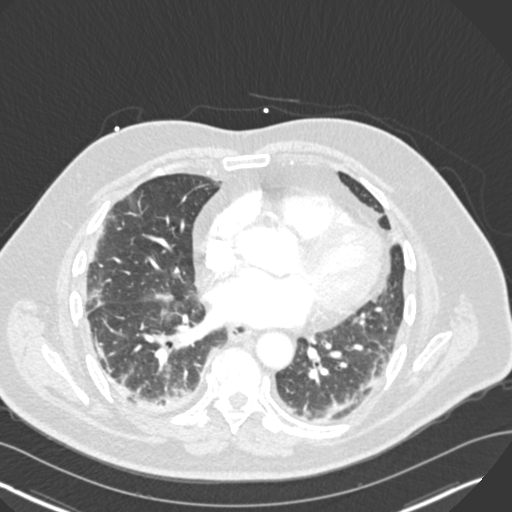
[im 142/273  soft-tissue]
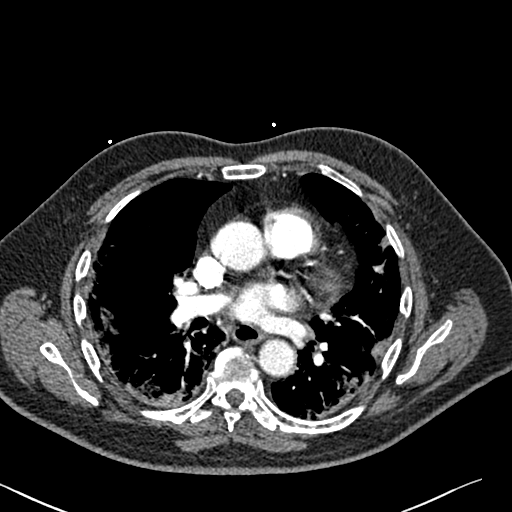
[im 154/273  lung]
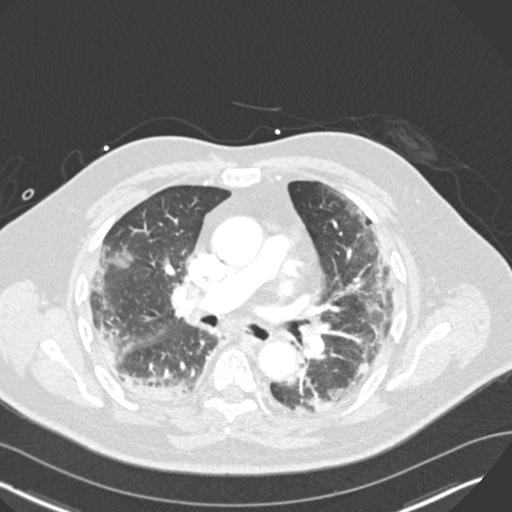
[im 166/273  soft-tissue]
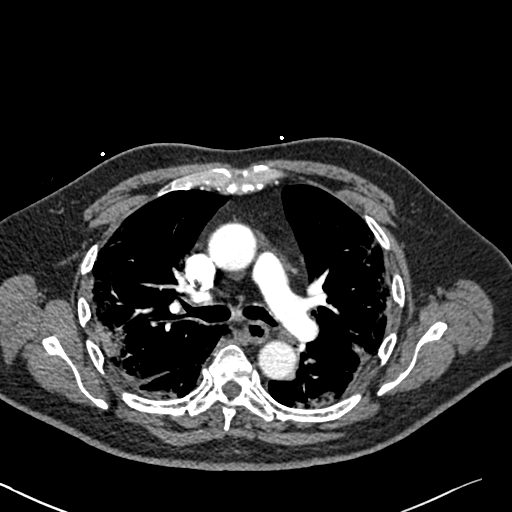
[im 190/273  lung]
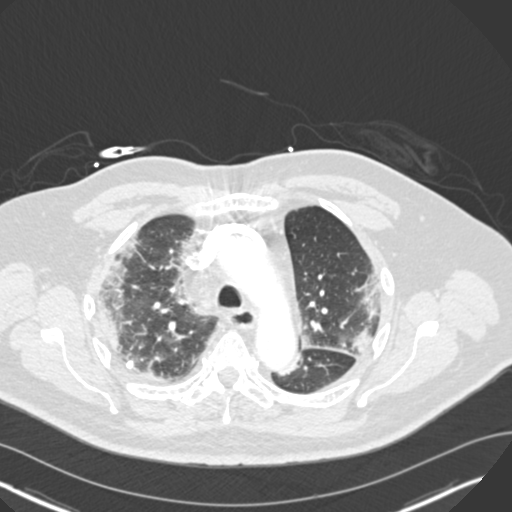
[im 202/273  soft-tissue]
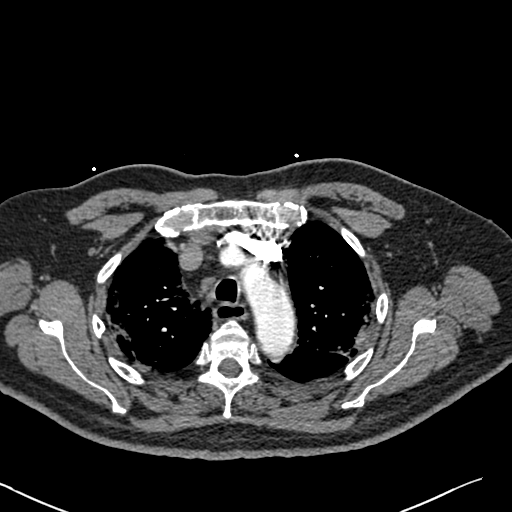
[im 225/273  lung]
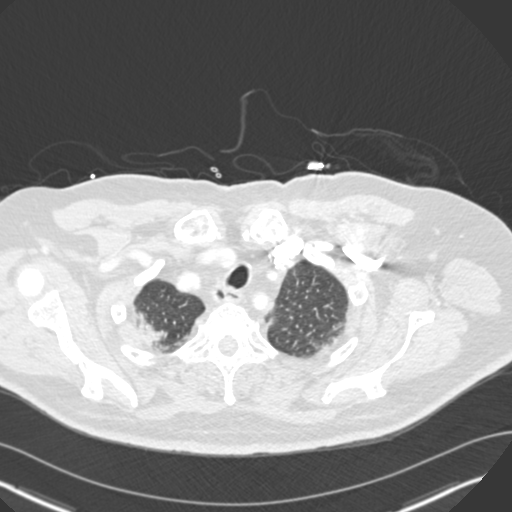
[im 237/273  soft-tissue]
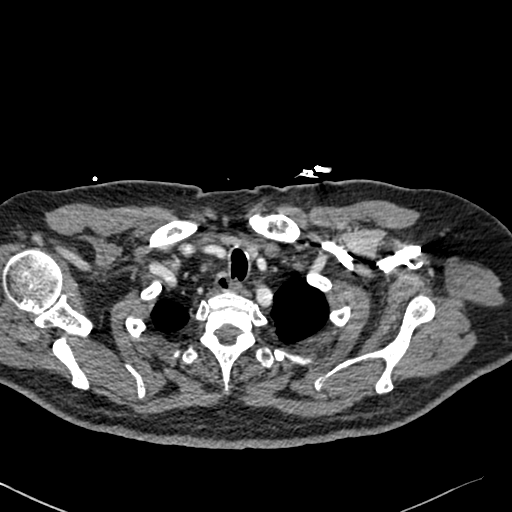
[im 261/273  lung]
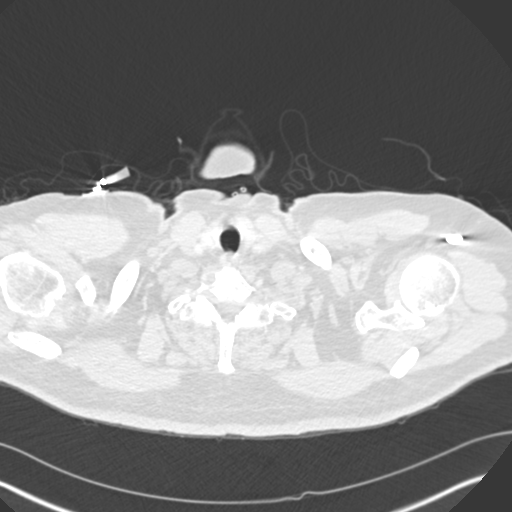

[Series 7: coronal mpr · coronal · 0.53mm/px · 3 of 87 slices shown]
[im 22/87  soft-tissue]
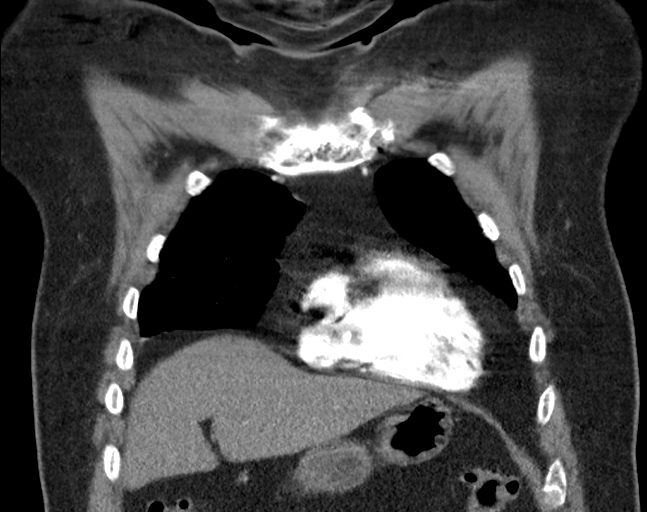
[im 44/87  soft-tissue]
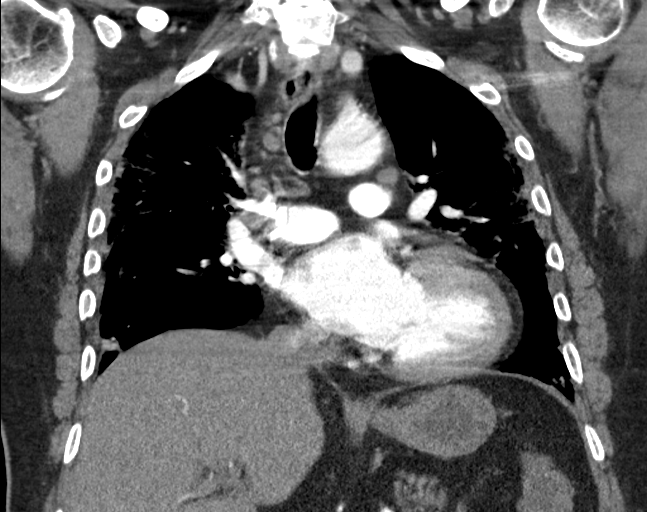
[im 65/87  soft-tissue]
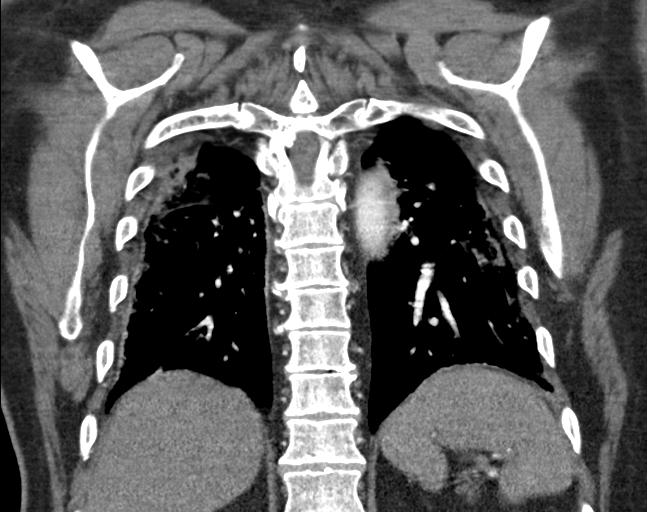

[18 of 46 positions shown; findings below may reference images not displayed]

FINDINGS: Cardiovascular: Thoracic aorta is well visualize without aneurysmal
dilatation or dissection. No significant cardiac enlargement is
noted. No pericardial effusion is seen. No significant coronary
calcifications are noted. The pulmonary artery shows a normal
branching pattern without intraluminal filling defect to suggest
pulmonary embolism.

Mediastinum/Nodes: Thoracic inlet is within normal limits. Scattered
small mediastinal and hilar lymph nodes are noted likely reactive in
nature. The esophagus is within normal limits as visualized.

Lungs/Pleura: Lungs are well aerated bilaterally with peripheral
ground-glass opacities throughout both lungs consistent with the
given clinical history of 1FW9N-ZE positivity.

Upper Abdomen: Visualized upper abdomen is unremarkable.

Musculoskeletal: No chest wall abnormality. No acute or significant
osseous findings.

Review of the MIP images confirms the above findings.
IMPRESSION: No evidence of pulmonary emboli.

Changes in the lungs bilaterally consistent with the given clinical
history of 1FW9N-ZE positivity.

## 2021-09-24 NOTE — Telephone Encounter (Signed)
Volunteer call for palliative check in , no answer

## 2021-10-08 DIAGNOSIS — F01518 Vascular dementia, unspecified severity, with other behavioral disturbance: Secondary | ICD-10-CM | POA: Diagnosis not present

## 2021-10-08 DIAGNOSIS — R42 Dizziness and giddiness: Secondary | ICD-10-CM | POA: Diagnosis not present

## 2021-10-08 DIAGNOSIS — Z7689 Persons encountering health services in other specified circumstances: Secondary | ICD-10-CM | POA: Diagnosis not present

## 2021-10-08 DIAGNOSIS — G309 Alzheimer's disease, unspecified: Secondary | ICD-10-CM | POA: Diagnosis not present

## 2021-10-08 DIAGNOSIS — F02818 Dementia in other diseases classified elsewhere, unspecified severity, with other behavioral disturbance: Secondary | ICD-10-CM | POA: Diagnosis not present

## 2021-10-08 DIAGNOSIS — Z79899 Other long term (current) drug therapy: Secondary | ICD-10-CM | POA: Diagnosis not present

## 2021-10-15 ENCOUNTER — Ambulatory Visit: Payer: Medicare Other | Admitting: Dermatology

## 2021-11-04 ENCOUNTER — Ambulatory Visit (INDEPENDENT_AMBULATORY_CARE_PROVIDER_SITE_OTHER): Payer: Medicare Other | Admitting: Dermatology

## 2021-11-04 DIAGNOSIS — B356 Tinea cruris: Secondary | ICD-10-CM

## 2021-11-04 DIAGNOSIS — B351 Tinea unguium: Secondary | ICD-10-CM | POA: Diagnosis not present

## 2021-11-04 DIAGNOSIS — L720 Epidermal cyst: Secondary | ICD-10-CM | POA: Diagnosis not present

## 2021-11-04 DIAGNOSIS — Z7189 Other specified counseling: Secondary | ICD-10-CM | POA: Diagnosis not present

## 2021-11-04 MED ORDER — TERBINAFINE HCL 250 MG PO TABS
250.0000 mg | ORAL_TABLET | Freq: Every day | ORAL | 1 refills | Status: DC
Start: 2021-11-04 — End: 2022-01-13

## 2021-11-04 NOTE — Progress Notes (Signed)
? ?  New Patient Visit ? ?Subjective  ?Kurt Bailey is a 76 y.o. male who presents for the following: rash (R hip, yrs, never cleared up, pt was on prescription medication in past possibly Terbinafine) and check nails (Fingernails, toenails, no treatment). ? ?New patient referral from [name], NP. ? ?Patient accompanied by wife who contributes to history. ? ?The following portions of the chart were reviewed this encounter and updated as appropriate:  ? Tobacco  Allergies  Meds  Problems  Med Hx  Surg Hx  Fam Hx   ?  ?Review of Systems:  No other skin or systemic complaints except as noted in HPI or Assessment and Plan. ? ?Objective  ?Well appearing patient in no apparent distress; mood and affect are within normal limits. ? ?A focused examination was performed including fingernails, toenails, buttocks. Relevant physical exam findings are noted in the Assessment and Plan. ? ?Scalp ?Cystic pap ? ?R hip, toenails, fingernails ?Pink scaly plaque right buttock, scaliness feet, nail dystrophy fingernails and toenails ? ? ?Assessment & Plan  ?Epidermal cyst ?Scalp ?Benign-appearing. Exam most consistent with an epidermal inclusion cyst. Discussed that a cyst is a benign growth that can grow over time and sometimes get irritated or inflamed. Recommend observation if it is not bothersome. Discussed option of surgical excision to remove it if it is growing, symptomatic, or other changes noted. Please call for new or changing lesions so they can be evaluated. ? 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 Condition is symptomatic / bothersome to patient. Not to goal. ?Labs from 12/2020 wnl ?Start Lamisil 250mg  1 po qd #30, 1 rf ? ?Terbinafine Counseling ? ?Terbinafine is an anti-fungal medicine that can be applied to the skin (over the counter) or taken by mouth (prescription) to treat fungal infections.  The pill version is often used to treat fungal infections of the nails or scalp. While most people do not have any side effects from taking terbinafine pills, some possible side effects of the medicine can include taste changes, headache, loss of smell, vision changes, nausea, vomiting, or diarrhea.  ? ?Rare side effects can include irritation of the liver, allergic reaction, or decrease in blood counts (which may show up as not feeling well or developing an infection). If you are concerned about any of these side effects, please stop the medicine and call your doctor, or in the case of an emergency such as feeling very unwell, seek immediate medical care.   ? ?terbinafine (LAMISIL) 250 MG tablet - R hip, toenails, fingernails ?Take 1 tablet (250 mg total) by mouth daily. ? ?Return in about 2 months (around 01/04/2022) for Tinea unguium/cruris f/u. ? ?I, 03/06/2022, RMA, am acting as scribe for Ardis Rowan, MD . ?Documentation: I have reviewed the above documentation for accuracy and completeness, and I agree with the above. ? ?[name], MD ? ?

## 2021-11-04 NOTE — Patient Instructions (Addendum)
Terbinafine Counseling  Terbinafine is an anti-fungal medicine that can be applied to the skin (over the counter) or taken by mouth (prescription) to treat fungal infections. The pill version is often used to treat fungal infections of the nails or scalp. While most people do not have any side effects from taking terbinafine pills, some possible side effects of the medicine can include taste changes, headache, loss of smell, vision changes, nausea, vomiting, or diarrhea.   Rare side effects can include irritation of the liver, allergic reaction, or decrease in blood counts (which may show up as not feeling well or developing an infection). If you are concerned about any of these side effects, please stop the medicine and call your doctor, or in the case of an emergency such as feeling very unwell, seek immediate medical care.   If You Need Anything After Your Visit  If you have any questions or concerns for your doctor, please call our main line at [phone] and press option 4 to reach your doctor's medical assistant. If no one answers, please leave a voicemail as directed and we will return your call as soon as possible. Messages left after 4 pm will be answered the following business day.   You may also send us a message via MyChart. We typically respond to MyChart messages within 1-2 business days.  For prescription refills, please ask your pharmacy to contact our office. Our fax number is [phone].  If you have an urgent issue when the clinic is closed that cannot wait until the next business day, you can page your doctor at the number below.    Please note that while we do our best to be available for urgent issues outside of office hours, we are not available 24/7.   If you have an urgent issue and are unable to reach us, you may choose to seek medical care at your doctor's office, retail clinic, urgent care center, or emergency room.  If you have a medical emergency, please  immediately call 911 or go to the emergency department.  Pager Numbers  - Dr. Kowalski: [phone]  - Dr. Moye: [phone]  - Dr. Stewart: [phone]  In the event of inclement weather, please call our main line at [phone] for an update on the status of any delays or closures.  Dermatology Medication Tips: Please keep the boxes that topical medications come in in order to help keep track of the instructions about where and how to use these. Pharmacies typically print the medication instructions only on the boxes and not directly on the medication tubes.   If your medication is too expensive, please contact our office at [phone] option 4 or send us a message through MyChart.   We are unable to tell what your co-pay for medications will be in advance as this is different depending on your insurance coverage. However, we may be able to find a substitute medication at lower cost or fill out paperwork to get insurance to cover a needed medication.   If a prior authorization is required to get your medication covered by your insurance company, please allow us 1-2 business days to complete this process.  Drug prices often vary depending on where the prescription is filled and some pharmacies may offer cheaper prices.  The website www.goodrx.com contains coupons for medications through different pharmacies. The prices here do not account for what the cost may be with help from insurance (it may be cheaper with your insurance), but the website can give   you the price if you did not use any insurance.  - You can print the associated coupon and take it with your prescription to the pharmacy.  - You may also stop by our office during regular business hours and pick up a GoodRx coupon card.  - If you need your prescription sent electronically to a different pharmacy, notify our office through [facility] MyChart or by phone at [phone] option 4.     Si Usted Necesita Algo Despus  de Su Visita  Tambin puede enviarnos un mensaje a travs de MyChart. Por lo general respondemos a los mensajes de MyChart en el transcurso de 1 a 2 das hbiles.  Para renovar recetas, por favor pida a su farmacia que se ponga en contacto con nuestra oficina. Nuestro nmero de fax es el [phone].  Si tiene un asunto urgente cuando la clnica est cerrada y que no puede esperar hasta el siguiente da hbil, puede llamar/localizar a su doctor(a) al nmero que aparece a continuacin.   Por favor, tenga en cuenta que aunque hacemos todo lo posible para estar disponibles para asuntos urgentes fuera del horario de oficina, no estamos disponibles las 24 horas del da, los 7 das de la semana.   Si tiene un problema urgente y no puede comunicarse con nosotros, puede optar por buscar atencin mdica  en el consultorio de su doctor(a), en una clnica privada, en un centro de atencin urgente o en una sala de emergencias.  Si tiene una emergencia mdica, por favor llame inmediatamente al 911 o vaya a la sala de emergencias.  Nmeros de bper  - Dr. Kowalski: [phone]  - Dra. Moye: [phone]  - Dra. Stewart: [phone]  En caso de inclemencias del tiempo, por favor llame a nuestra lnea principal al [phone] para una actualizacin sobre el estado de cualquier retraso o cierre.  Consejos para la medicacin en dermatologa: Por favor, guarde las cajas en las que vienen los medicamentos de uso tpico para ayudarle a seguir las instrucciones sobre dnde y cmo usarlos. Las farmacias generalmente imprimen las instrucciones del medicamento slo en las cajas y no directamente en los tubos del medicamento.   Si su medicamento es muy caro, por favor, pngase en contacto con nuestra oficina llamando al [phone] y presione la opcin 4 o envenos un mensaje a travs de MyChart.   No podemos decirle cul ser su copago por los medicamentos por adelantado ya que esto es diferente dependiendo  de la cobertura de su seguro. Sin embargo, es posible que podamos encontrar un medicamento sustituto a menor costo o llenar un formulario para que el seguro cubra el medicamento que se considera necesario.   Si se requiere una autorizacin previa para que su compaa de seguros cubra su medicamento, por favor permtanos de 1 a 2 das hbiles para completar este proceso.  Los precios de los medicamentos varan con frecuencia dependiendo del lugar de dnde se surte la receta y alguna farmacias pueden ofrecer precios ms baratos.  El sitio web www.goodrx.com tiene cupones para medicamentos de diferentes farmacias. Los precios aqu no tienen en cuenta lo que podra costar con la ayuda del seguro (puede ser ms barato con su seguro), pero el sitio web puede darle el precio si no utiliz ningn seguro.  - Puede imprimir el cupn correspondiente y llevarlo con su receta a la farmacia.  - Tambin puede pasar por nuestra oficina durante el horario de atencin regular y recoger una tarjeta de cupones de GoodRx.  - Si   necesita que su receta se enve electrnicamente a una farmacia diferente, informe a nuestra oficina a travs de MyChart de [facility] o por telfono llamando al [phone] y presione la opcin 4.  

## 2021-11-09 ENCOUNTER — Encounter: Payer: Self-pay | Admitting: Dermatology

## 2021-11-12 DIAGNOSIS — Z20822 Contact with and (suspected) exposure to covid-19: Secondary | ICD-10-CM | POA: Diagnosis not present

## 2021-12-07 DIAGNOSIS — Z20822 Contact with and (suspected) exposure to covid-19: Secondary | ICD-10-CM | POA: Diagnosis not present

## 2021-12-16 LAB — HM DIABETES EYE EXAM

## 2021-12-17 ENCOUNTER — Other Ambulatory Visit: Payer: Medicare Other

## 2021-12-17 DIAGNOSIS — Z515 Encounter for palliative care: Secondary | ICD-10-CM

## 2021-12-17 NOTE — Progress Notes (Signed)
COMMUNITY PALLIATIVE CARE SW NOTE  PATIENT NAME: Kurt Bailey DOB: June 10, 1946 MRN: [id number]  PRIMARY CARE PROVIDER: Doreene Nest, NP  RESPONSIBLE PARTY:  Acct ID - Guarantor Home Phone Work Phone Relationship Acct Type  [id number] Sherilyn Cooter* [phone]  Self P/F     [address], [place], [place] [address]    Palliative care SW outreached patient to complete telephonic visit.    Palliative care SW outreached patients daughter, Efraim Kaufmann, to complete telephonic visit, after receiving no answer on patients home number. Patient provided update on medical condition and/or changes.   Daughter endorses that patients dementia is worsening and becoming more fatigued and withdrawn. Continues to be independent with ADL's and ambulate w/o AD. No falls reported.   Hospitalizations: None.  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 No significant weight changes noted.  Sleeping: Per daughter patient is sleeping/napping more.  No medication needs or adjustments noted.    Psychosocial assessment: completed. No other psychosocial needs. No S/S of depression or anxiety noted. Patient continues to drive, per daughter, which is a safety concern.  Daughter would like for PC to schedule a visit with patient's spouse, Debbe Odea (daughter states spouse maybe reluctant to schedule due to not wanting anyone in their home). SW outreached spouse and LVM. If no return call from spouse PC will do another telephonic check in the next 6-8 weeks.    Palliative care will continue to monitor and assist with long term care planning as needed.     SOCIAL HX:  Social History   Tobacco Use   Smoking status: Never   Smokeless tobacco: Never  Substance Use Topics   Alcohol use: Not Currently    CODE STATUS: FULL  CODE   Time spent: 25 min   [place], [place]

## 2021-12-18 ENCOUNTER — Encounter: Payer: Self-pay | Admitting: Primary Care

## 2021-12-18 ENCOUNTER — Telehealth: Payer: Self-pay | Admitting: Primary Care

## 2021-12-18 ENCOUNTER — Telehealth: Payer: Self-pay

## 2021-12-18 NOTE — Telephone Encounter (Signed)
Please notify patient:  Eye exam did not show damage from diabetes. Repeat in 1 year

## 2021-12-18 NOTE — Telephone Encounter (Signed)
INCOMING CALL: patients spouse returned SW TC.   Spouse denied waning to schedule an in person visit at this time and states that she will discuss the need further with patient and daughter and outreach SW again next week.

## 2021-12-18 NOTE — Telephone Encounter (Signed)
Called to give patient results. Wife answered would not let me talk to patient. She was on DPR results given.

## 2022-01-01 ENCOUNTER — Ambulatory Visit (INDEPENDENT_AMBULATORY_CARE_PROVIDER_SITE_OTHER): Payer: Medicare Other

## 2022-01-01 VITALS — Ht 68.0 in | Wt 200.0 lb

## 2022-01-01 DIAGNOSIS — Z0000 Encounter for general adult medical examination without abnormal findings: Secondary | ICD-10-CM

## 2022-01-01 NOTE — Patient Instructions (Signed)
[name] , Thank you for taking time to come for your Medicare Wellness Visit. I appreciate your ongoing commitment to your health goals. Please review the following plan we discussed and let me know if I can assist you in the future.   Screening recommendations/referrals: Colonoscopy: decline Recommended yearly ophthalmology/optometry visit for glaucoma screening and checkup Recommended yearly dental visit for hygiene and checkup  Vaccinations: Influenza vaccine: decline Pneumococcal vaccine: decline Tdap vaccine: decline Shingles vaccine: decline   Covid-19: decline  Advanced directives: Advance directive discussed with you today.   Conditions/risks identified: none  Next appointment: Follow up in one year for your annual wellness visit.   Preventive Care 13 Years and Older, Male Preventive care refers to lifestyle choices and visits with your health care provider that can promote health and wellness. What does preventive care include? A yearly physical exam. This is also called an annual well check. Dental exams once or twice a year. Routine eye exams. Ask your health care provider how often you should have your eyes checked. Personal lifestyle choices, including: Daily care of your teeth and gums. Regular physical activity. Eating a healthy diet. Avoiding tobacco and drug use. Limiting alcohol use. Practicing safe sex. Taking low doses of aspirin every day. Taking vitamin and mineral supplements as recommended by your health care provider. What happens during an annual well check? The services and screenings done by your health care provider during your annual well check will depend on your age, overall health, lifestyle risk factors, and family history of disease. Counseling  Your health care provider may ask you questions about your: Alcohol use. Tobacco use. Drug use. Emotional well-being. Home and relationship well-being. Sexual activity. Eating  habits. History of falls. Memory and ability to understand (cognition). Work and work Astronomer. Screening  You may have the following tests or measurements: Height, weight, and BMI. Blood pressure. Lipid and cholesterol levels. These may be checked every 5 years, or more frequently if you are over 1 years old. Skin check. Lung cancer screening. You may have this screening every year starting at age 48 if you have a 30-pack-year history of smoking and currently smoke or have quit within the past 15 years. Fecal occult blood test (FOBT) of the stool. You may have this test every year starting at age 24. Flexible sigmoidoscopy or colonoscopy. You may have a sigmoidoscopy every 5 years or a colonoscopy every 10 years starting at age 48. Prostate cancer screening. Recommendations will vary depending on your family history and other risks. Hepatitis C blood test. Hepatitis B blood test. Sexually transmitted disease (STD) testing. Diabetes screening. This is done by checking your blood sugar (glucose) after you have not eaten for a while (fasting). You may have this done every 1-3 years. Abdominal aortic aneurysm (AAA) screening. You may need this if you are a current or former smoker. Osteoporosis. You may be screened starting at age 30 if you are at high risk. Talk with your health care provider about your test results, treatment options, and if necessary, the need for more tests. Vaccines  Your health care provider may recommend certain vaccines, such as: Influenza vaccine. This is recommended every year. Tetanus, diphtheria, and acellular pertussis (Tdap, Td) vaccine. You may need a Td booster every 10 years. Zoster vaccine. You may need this after age 46. Pneumococcal 13-valent conjugate (PCV13) vaccine. One dose is recommended after age 81. Pneumococcal polysaccharide (PPSV23) vaccine. One dose is recommended after age 57. Talk to your health care provider  about which screenings and  vaccines you need and how often you need them. This information is not intended to replace advice given to you by your health care provider. Make sure you discuss any questions you have with your health care provider. Document Released: 08/15/2015 Document Revised: 04/07/2016 Document Reviewed: 05/20/2015 Elsevier Interactive Patient Education  2017 [place] Prevention in the Home Falls can cause injuries. They can happen to people of all ages. There are many things you can do to make your home safe and to help prevent falls. What can I do on the outside of my home? Regularly fix the edges of walkways and driveways and fix any cracks. Remove anything that might make you trip as you walk through a door, such as a raised step or threshold. Trim any bushes or trees on the path to your home. Use bright outdoor lighting. Clear any walking paths of anything that might make someone trip, such as rocks or tools. Regularly check to see if handrails are loose or broken. Make sure that both sides of any steps have handrails. Any raised decks and porches should have guardrails on the edges. Have any leaves, snow, or ice cleared regularly. Use sand or salt on walking paths during winter. Clean up any spills in your garage right away. This includes oil or grease spills. What can I do in the bathroom? Use night lights. Install grab bars by the toilet and in the tub and shower. Do not use towel bars as grab bars. Use non-skid mats or decals in the tub or shower. If you need to sit down in the shower, use a plastic, non-slip stool. Keep the floor dry. Clean up any water that spills on the floor as soon as it happens. Remove soap buildup in the tub or shower regularly. Attach bath mats securely with double-sided non-slip rug tape. Do not have throw rugs and other things on the floor that can make you trip. What can I do in the bedroom? Use night lights. Make sure that you have a light by your  bed that is easy to reach. Do not use any sheets or blankets that are too big for your bed. They should not hang down onto the floor. Have a firm chair that has side arms. You can use this for support while you get dressed. Do not have throw rugs and other things on the floor that can make you trip. What can I do in the kitchen? Clean up any spills right away. Avoid walking on wet floors. Keep items that you use a lot in easy-to-reach places. If you need to reach something above you, use a strong step stool that has a grab bar. Keep electrical cords out of the way. Do not use floor polish or wax that makes floors slippery. If you must use wax, use non-skid floor wax. Do not have throw rugs and other things on the floor that can make you trip. What can I do with my stairs? Do not leave any items on the stairs. Make sure that there are handrails on both sides of the stairs and use them. Fix handrails that are broken or loose. Make sure that handrails are as long as the stairways. Check any carpeting to make sure that it is firmly attached to the stairs. Fix any carpet that is loose or worn. Avoid having throw rugs at the top or bottom of the stairs. If you do have throw rugs, attach them to the floor  with carpet tape. Make sure that you have a light switch at the top of the stairs and the bottom of the stairs. If you do not have them, ask someone to add them for you. What else can I do to help prevent falls? Wear shoes that: Do not have high heels. Have rubber bottoms. Are comfortable and fit you well. Are closed at the toe. Do not wear sandals. If you use a stepladder: Make sure that it is fully opened. Do not climb a closed stepladder. Make sure that both sides of the stepladder are locked into place. Ask someone to hold it for you, if possible. Clearly mark and make sure that you can see: Any grab bars or handrails. First and last steps. Where the edge of each step is. Use tools that  help you move around (mobility aids) if they are needed. These include: Canes. Walkers. Scooters. Crutches. Turn on the lights when you go into a dark area. Replace any light bulbs as soon as they burn out. Set up your furniture so you have a clear path. Avoid moving your furniture around. If any of your floors are uneven, fix them. If there are any pets around you, be aware of where they are. Review your medicines with your doctor. Some medicines can make you feel dizzy. This can increase your chance of falling. Ask your doctor what other things that you can do to help prevent falls. This information is not intended to replace advice given to you by your health care provider. Make sure you discuss any questions you have with your health care provider. Document Released: 05/15/2009 Document Revised: 12/25/2015 Document Reviewed: 08/23/2014 Elsevier Interactive Patient Education  2017 [employer].

## 2022-01-01 NOTE — Progress Notes (Signed)
I connected with Kurt Bailey today by telephone and verified that I am speaking with the correct person using two identifiers. Location patient: home Location provider: work Persons participating in the virtual visit: Kurt Bailey, Kurt Bailey, Leckey LPN.   I discussed the limitations, risks, security and privacy concerns of performing an evaluation and management service by telephone and the availability of in person appointments. I also discussed with the patient that there may be a patient responsible charge related to this service. The patient expressed understanding and verbally consented to this telephonic visit.    Interactive audio and video telecommunications were attempted between this provider and patient, however failed, due to patient having technical difficulties OR patient did not have access to video capability.  We continued and completed visit with audio only.     Vital signs may be patient reported or missing.  Subjective:   Kurt Bailey is a 76 y.o. male who presents for Medicare Annual/Subsequent preventive examination.  Review of Systems     Cardiac Risk Factors include: advanced age (>7men, >72 women);dyslipidemia;male gender     Objective:    Today's Vitals   01/01/22 1547  Weight: 200 lb (90.7 kg)  Height: 5\' 8"  (1.727 m)   Body mass index is 30.41 kg/m.     01/01/2022    3:57 PM 05/14/2020    3:00 AM  Advanced Directives  Does Patient Have a Medical Advance Directive? No No  Would patient like information on creating a medical advance directive?  No - Patient declined    Current Medications (verified) Outpatient Encounter Medications as of 01/01/2022  Medication Sig   acetaminophen (TYLENOL) 500 MG tablet Take 500 mg by mouth every 6 (six) hours as needed.   atorvastatin (LIPITOR) 40 MG tablet Take 1 tablet (40 mg total) by mouth daily. For cholesterol   cyanocobalamin 1000 MCG tablet Take by mouth.   divalproex (DEPAKOTE)  500 MG DR tablet Take 1,000 mg by mouth 2 (two) times daily.   Omega-3 1000 MG CAPS Take 1,000 mg by mouth every evening.    terbinafine (LAMISIL) 250 MG tablet Take 1 tablet (250 mg total) by mouth daily.   vitamin B-12 (CYANOCOBALAMIN) 1000 MCG tablet Take 1,000 mcg by mouth every evening.    memantine (NAMENDA) 10 MG tablet Take 10 mg by mouth 2 (two) times daily.    No facility-administered encounter medications on file as of 01/01/2022.    Allergies (verified) Patient has no known allergies.   History: Past Medical History:  Diagnosis Date   Acute hypoxemic respiratory failure due to COVID-19 (HCC) 05/13/2020   Alcohol abuse    Diverticulitis    Periumbilical hernia    History reviewed. No pertinent surgical history. Family History  Problem Relation Age of Onset   Alcohol abuse Father    Stroke Father    Hypertension Father    Emphysema Father    Breast cancer Sister    Heart attack Brother    Aneurysm Sister    Stroke Brother    Hyperlipidemia Brother    Social History   Socioeconomic History   Marital status: Married    Spouse name: Not on file   Number of children: Not on file   Years of education: Not on file   Highest education level: Not on file  Occupational History   Not on file  Tobacco Use   Smoking status: Never   Smokeless tobacco: Never  Vaping Use   Vaping Use: Never used  Substance and Sexual Activity   Alcohol use: Not Currently   Drug use: Never   Sexual activity: Not on file  Other Topics Concern   Not on file  Social History Narrative   Married.   4 children.   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   Enjoys relaxing, walking.   Social Determinants of Health   Financial Resource Strain: Low Risk    Difficulty of Paying Living Expenses: Not hard at all  Food Insecurity: No Food Insecurity   Worried About Programme researcher, broadcasting/film/video in the Last Year: Never true   Ran Out of Food in the Last Year: Never true  Transportation Needs: No  Transportation Needs   Lack of Transportation (Medical): No   Lack of Transportation (Non-Medical): No  Physical Activity: Inactive   Days of Exercise per Week: 0 days   Minutes of Exercise per Session: 0 min  Stress: Not on file  Social Connections: Not on file    Tobacco Counseling Counseling given: Not Answered   Clinical Intake:  Pre-visit preparation completed: Yes  Pain : No/denies pain     Nutritional Status: BMI > 30  Obese Nutritional Risks: None Diabetes: No  How often do you need to have someone help you when you read instructions, pamphlets, or other written materials from your doctor or pharmacy?: 5 - Always  Diabetic? no  Interpreter Needed?: No  Information entered by :: NAllen LPN   Activities of Daily Living    01/01/2022    3:59 PM  In your present state of health, do you have any difficulty performing the following activities:  Hearing? 0  Vision? 0  Difficulty concentrating or making decisions? 1  Walking or climbing stairs? 0  Dressing or bathing? 0  Doing errands, shopping? 0  Preparing Food and eating ? N  Using the Toilet? N  In the past six months, have you accidently leaked urine? N  Do you have problems with loss of bowel control? N  Managing your Medications? Y  Managing your Finances? Y  Housekeeping or managing your Housekeeping? Y    Patient Care Team: Doreene Nest, NP as PCP - General (Internal Medicine)  Indicate any recent Medical Services you may have received from other than [facility] providers in the past year (date may be approximate).     Assessment:   This is a routine wellness examination for [place].  Hearing/Vision screen Vision Screening - Comments:: No regular eye exams,  Dietary issues and exercise activities discussed: Current Exercise Habits: The patient does not participate in regular exercise at present   Goals Addressed             This Visit's Progress    Patient Stated       01/01/2022, no  goals       Depression Screen    01/01/2022    3:59 PM  PHQ 2/9 Scores  PHQ - 2 Score 0    Fall Risk    01/01/2022    3:58 PM  Fall Risk   Falls in the past year? 1  Comment tripped in the yard  Number falls in past yr: 1  Injury with Fall? 0  Risk for fall due to : Medication side effect  Follow up Falls evaluation completed;Education provided;Falls prevention discussed    FALL RISK PREVENTION PERTAINING TO THE HOME:  Any stairs in or around the home? No  If so, are there any without handrails? N/a Home free of loose throw rugs  in walkways, pet beds, electrical cords, etc? Yes  Adequate lighting in your home to reduce risk of falls? Yes   ASSISTIVE DEVICES UTILIZED TO PREVENT FALLS:  Life alert? No  Use of a cane, walker or w/c? No  Grab bars in the bathroom? Yes  Shower chair or bench in shower? No  Elevated toilet seat or a handicapped toilet? No   TIMED UP AND GO:  Was the test performed? No .      Cognitive Function:        Immunizations  There is no immunization history on file for this patient.  TDAP status: Due, Education has been provided regarding the importance of this vaccine. Advised may receive this vaccine at local pharmacy or Health Dept. Aware to provide a copy of the vaccination record if obtained from local pharmacy or Health Dept. Verbalized acceptance and understanding.  Flu Vaccine status: Declined, Education has been provided regarding the importance of this vaccine but patient still declined. Advised may receive this vaccine at local pharmacy or Health Dept. Aware to provide a copy of the vaccination record if obtained from local pharmacy or Health Dept. Verbalized acceptance and understanding.  Pneumococcal vaccine status: Declined,  Education has been provided regarding the importance of this vaccine but patient still declined. Advised may receive this vaccine at local pharmacy or Health Dept. Aware to provide a copy of the vaccination  record if obtained from local pharmacy or Health Dept. Verbalized acceptance and understanding.   Covid-19 vaccine status: Declined, Education has been provided regarding the importance of this vaccine but patient still declined. Advised may receive this vaccine at local pharmacy or Health Dept.or vaccine clinic. Aware to provide a copy of the vaccination record if obtained from local pharmacy or Health Dept. Verbalized acceptance and understanding.  Qualifies for Shingles Vaccine? Yes   Zostavax completed No   Shingrix Completed?: No.    Education has been provided regarding the importance of this vaccine. Patient has been advised to call insurance company to determine out of pocket expense if they have not yet received this vaccine. Advised may also receive vaccine at local pharmacy or Health Dept. Verbalized acceptance and understanding.  Screening Tests Health Maintenance  Topic Date Due   Zoster Vaccines- Shingrix (1 of 2) Never done   COLONOSCOPY (Pts 45-2547yrs Insurance coverage will need to be confirmed)  Never done   Pneumonia Vaccine 8065+ Years old (1 - PCV) Never done   INFLUENZA VACCINE  03/02/2022   Hepatitis C Screening  Completed   HPV VACCINES  Aged Out   TETANUS/TDAP  Discontinued   COVID-19 Vaccine  Discontinued    Health Maintenance  Health Maintenance Due  Topic Date Due   Zoster Vaccines- Shingrix (1 of 2) Never done   COLONOSCOPY (Pts 45-1147yrs Insurance coverage will need to be confirmed)  Never done   Pneumonia Vaccine 7365+ Years old (1 - PCV) Never done    Colorectal cancer screening: No longer required.   Lung Cancer Screening: (Low Dose CT Chest recommended if Age 68-80 years, 30 pack-year currently smoking OR have quit w/in 15years.) does not qualify.   Lung Cancer Screening Referral: no  Additional Screening:  Hepatitis C Screening: does qualify; Completed 09/19/2018  Vision Screening: Recommended annual ophthalmology exams for early detection of  glaucoma and other disorders of the eye. Is the patient up to date with their annual eye exam?  No  Who is the provider or what is the name of the office in  which the patient attends annual eye exams? none If pt is not established with a provider, would they like to be referred to a provider to establish care? No .   Dental Screening: Recommended annual dental exams for proper oral hygiene  Community Resource Referral / Chronic Care Management: CRR required this visit?  No   CCM required this visit?  No      Plan:     I have personally reviewed and noted the following in the patient's chart:   Medical and social history Use of alcohol, tobacco or illicit drugs  Current medications and supplements including opioid prescriptions. Patient is not currently taking opioid prescriptions. Functional ability and status Nutritional status Physical activity Advanced directives List of other physicians Hospitalizations, surgeries, and ER visits in previous 12 months Vitals Screenings to include cognitive, depression, and falls Referrals and appointments  In addition, I have reviewed and discussed with patient certain preventive protocols, quality metrics, and best practice recommendations. A written personalized care plan for preventive services as well as general preventive health recommendations were provided to patient.     Barb Merino, LPN   01/31/2196   Nurse Notes: 6 CIT not administered. Patient has diagnosis of Alzheimer's.  Due to this being a virtual visit, the after visit summary with patients personalized plan was offered to patient via mail or my-chart. Patient preferred to pick up at office at next visit

## 2022-01-02 ENCOUNTER — Other Ambulatory Visit: Payer: Self-pay | Admitting: Dermatology

## 2022-01-02 DIAGNOSIS — B351 Tinea unguium: Secondary | ICD-10-CM

## 2022-01-05 ENCOUNTER — Other Ambulatory Visit: Payer: Self-pay | Admitting: Dermatology

## 2022-01-05 DIAGNOSIS — B351 Tinea unguium: Secondary | ICD-10-CM

## 2022-01-07 ENCOUNTER — Other Ambulatory Visit: Payer: Self-pay | Admitting: Dermatology

## 2022-01-07 DIAGNOSIS — B351 Tinea unguium: Secondary | ICD-10-CM

## 2022-01-13 ENCOUNTER — Ambulatory Visit (INDEPENDENT_AMBULATORY_CARE_PROVIDER_SITE_OTHER): Payer: Medicare Other | Admitting: Dermatology

## 2022-01-13 ENCOUNTER — Encounter: Payer: Self-pay | Admitting: Dermatology

## 2022-01-13 DIAGNOSIS — B351 Tinea unguium: Secondary | ICD-10-CM

## 2022-01-13 DIAGNOSIS — B356 Tinea cruris: Secondary | ICD-10-CM | POA: Diagnosis not present

## 2022-01-13 DIAGNOSIS — Z7189 Other specified counseling: Secondary | ICD-10-CM

## 2022-01-13 MED ORDER — TERBINAFINE HCL 250 MG PO TABS: 250.0000 mg | ORAL_TABLET | Freq: Every day | ORAL | 1 refills | Status: DC

## 2022-01-13 NOTE — Progress Notes (Signed)
   Follow-Up Visit   Subjective  Kurt Bailey is a 76 y.o. male who presents for the following: Follow-up (2 month follow up tinea unguium and cruris - Lamisil 250 mg x 1 month). He is tolerating well without side effects and is seeing some improvement.  The following portions of the chart were reviewed this encounter and updated as appropriate:   Tobacco  Allergies  Meds  Problems  Med Hx  Surg Hx  Fam Hx     Review of Systems:  No other skin or systemic complaints except as noted in HPI or Assessment and Plan.  Objective  Well appearing patient in no apparent distress; mood and affect are within normal limits.  A focused examination was performed including hands. Relevant physical exam findings are noted in the Assessment and Plan.   Assessment & Plan  Tinea unguium Toenails, fingernails and right hip  Tinea unguium and cruris  Continue Lamisil 250 mg 1 po qd for 2 more months  Terbinafine Counseling   Terbinafine is an anti-fungal medicine that can be applied to the skin (over the counter) or taken by mouth (prescription) to treat fungal infections. The pill version is often used to treat fungal infections of the nails or scalp. While most people do not have any side effects from taking terbinafine pills, some possible side effects of the medicine can include taste changes, headache, loss of smell, vision changes, nausea, vomiting, or diarrhea.    Rare side effects can include irritation of the liver, allergic reaction, or decrease in blood counts (which may show up as not feeling well or developing an infection). If you are concerned about any of these side effects, please stop the medicine and call your doctor, or in the case of an emergency such as feeling very unwell, seek immediate medical care.    terbinafine (LAMISIL) 250 MG tablet - Toenails, fingernails and right hip Take 1 tablet (250 mg total) by mouth daily.  Return in about 8 months (around  09/15/2022).  I, Ashok Cordia, CMA, am acting as scribe for Sarina Ser, MD . Documentation: I have reviewed the above documentation for accuracy and completeness, and I agree with the above.  Sarina Ser, MD

## 2022-01-13 NOTE — Patient Instructions (Signed)
Terbinafine Counseling  Terbinafine is an anti-fungal medicine that can be applied to the skin (over the counter) or taken by mouth (prescription) to treat fungal infections. The pill version is often used to treat fungal infections of the nails or scalp. While most people do not have any side effects from taking terbinafine pills, some possible side effects of the medicine can include taste changes, headache, loss of smell, vision changes, nausea, vomiting, or diarrhea.   Rare side effects can include irritation of the liver, allergic reaction, or decrease in blood counts (which may show up as not feeling well or developing an infection). If you are concerned about any of these side effects, please stop the medicine and call your doctor, or in the case of an emergency such as feeling very unwell, seek immediate medical care.    Due to recent changes in healthcare laws, you may see results of your pathology and/or laboratory studies on MyChart before the doctors have had a chance to review them. We understand that in some cases there may be results that are confusing or concerning to you. Please understand that not all results are received at the same time and often the doctors may need to interpret multiple results in order to provide you with the best plan of care or course of treatment. Therefore, we ask that you please give us 2 business days to thoroughly review all your results before contacting the office for clarification. Should we see a critical lab result, you will be contacted sooner.   If You Need Anything After Your Visit  If you have any questions or concerns for your doctor, please call our main line at [phone] and press option 4 to reach your doctor's medical assistant. If no one answers, please leave a voicemail as directed and we will return your call as soon as possible. Messages left after 4 pm will be answered the following business day.   You may also send us a message via  MyChart. We typically respond to MyChart messages within 1-2 business days.  For prescription refills, please ask your pharmacy to contact our office. Our fax number is [phone].  If you have an urgent issue when the clinic is closed that cannot wait until the next business day, you can page your doctor at the number below.    Please note that while we do our best to be available for urgent issues outside of office hours, we are not available 24/7.   If you have an urgent issue and are unable to reach us, you may choose to seek medical care at your doctor's office, retail clinic, urgent care center, or emergency room.  If you have a medical emergency, please immediately call 911 or go to the emergency department.  Pager Numbers  - Dr. Kowalski: [phone]  - Dr. Moye: [phone]  - Dr. Stewart: [phone]  In the event of inclement weather, please call our main line at [phone] for an update on the status of any delays or closures.  Dermatology Medication Tips: Please keep the boxes that topical medications come in in order to help keep track of the instructions about where and how to use these. Pharmacies typically print the medication instructions only on the boxes and not directly on the medication tubes.   If your medication is too expensive, please contact our office at [phone] option 4 or send us a message through MyChart.   We are unable to tell what your co-pay for medications will be   in advance as this is different depending on your insurance coverage. However, we may be able to find a substitute medication at lower cost or fill out paperwork to get insurance to cover a needed medication.   If a prior authorization is required to get your medication covered by your insurance company, please allow us 1-2 business days to complete this process.  Drug prices often vary depending on where the prescription is filled and some pharmacies may offer cheaper  prices.  The website www.goodrx.com contains coupons for medications through different pharmacies. The prices here do not account for what the cost may be with help from insurance (it may be cheaper with your insurance), but the website can give you the price if you did not use any insurance.  - You can print the associated coupon and take it with your prescription to the pharmacy.  - You may also stop by our office during regular business hours and pick up a GoodRx coupon card.  - If you need your prescription sent electronically to a different pharmacy, notify our office through [facility] MyChart or by phone at [phone] option 4.     Si Usted Necesita Algo Despus de Su Visita  Tambin puede enviarnos un mensaje a travs de MyChart. Por lo general respondemos a los mensajes de MyChart en el transcurso de 1 a 2 das hbiles.  Para renovar recetas, por favor pida a su farmacia que se ponga en contacto con nuestra oficina. Nuestro nmero de fax es el [phone].  Si tiene un asunto urgente cuando la clnica est cerrada y que no puede esperar hasta el siguiente da hbil, puede llamar/localizar a su doctor(a) al nmero que aparece a continuacin.   Por favor, tenga en cuenta que aunque hacemos todo lo posible para estar disponibles para asuntos urgentes fuera del horario de oficina, no estamos disponibles las 24 horas del da, los 7 das de la semana.   Si tiene un problema urgente y no puede comunicarse con nosotros, puede optar por buscar atencin mdica  en el consultorio de su doctor(a), en una clnica privada, en un centro de atencin urgente o en una sala de emergencias.  Si tiene una emergencia mdica, por favor llame inmediatamente al 911 o vaya a la sala de emergencias.  Nmeros de bper  - Dr. Kowalski: [phone]  - Dra. Moye: [phone]  - Dra. Stewart: [phone]  En caso de inclemencias del tiempo, por favor llame a nuestra lnea principal al [phone]  para una actualizacin sobre el estado de cualquier retraso o cierre.  Consejos para la medicacin en dermatologa: Por favor, guarde las cajas en las que vienen los medicamentos de uso tpico para ayudarle a seguir las instrucciones sobre dnde y cmo usarlos. Las farmacias generalmente imprimen las instrucciones del medicamento slo en las cajas y no directamente en los tubos del medicamento.   Si su medicamento es muy caro, por favor, pngase en contacto con nuestra oficina llamando al [phone] y presione la opcin 4 o envenos un mensaje a travs de MyChart.   No podemos decirle cul ser su copago por los medicamentos por adelantado ya que esto es diferente dependiendo de la cobertura de su seguro. Sin embargo, es posible que podamos encontrar un medicamento sustituto a menor costo o llenar un formulario para que el seguro cubra el medicamento que se considera necesario.   Si se requiere una autorizacin previa para que su compaa de seguros cubra su medicamento, por favor permtanos de 1 a   2 das hbiles para completar este proceso.  Los precios de los medicamentos varan con frecuencia dependiendo del lugar de dnde se surte la receta y alguna farmacias pueden ofrecer precios ms baratos.  El sitio web www.goodrx.com tiene cupones para medicamentos de diferentes farmacias. Los precios aqu no tienen en cuenta lo que podra costar con la ayuda del seguro (puede ser ms barato con su seguro), pero el sitio web puede darle el precio si no utiliz ningn seguro.  - Puede imprimir el cupn correspondiente y llevarlo con su receta a la farmacia.  - Tambin puede pasar por nuestra oficina durante el horario de atencin regular y recoger una tarjeta de cupones de GoodRx.  - Si necesita que su receta se enve electrnicamente a una farmacia diferente, informe a nuestra oficina a travs de MyChart de [facility] o por telfono llamando al [phone] y presione la opcin 4.  

## 2022-02-05 ENCOUNTER — Ambulatory Visit: Payer: Medicare Other

## 2022-02-05 DIAGNOSIS — Z515 Encounter for palliative care: Secondary | ICD-10-CM

## 2022-02-05 NOTE — Progress Notes (Signed)
COMMUNITY PALLIATIVE CARE SW NOTE  PATIENT NAME: Kurt Bailey DOB: 05-13-1946 MRN: [id number]  PRIMARY CARE PROVIDER: Doreene Nest, NP  RESPONSIBLE PARTY:  Acct ID - Guarantor Home Phone Work Phone Relationship Acct Type  [id number] Sherilyn Cooter* [phone]  Self P/F     [address], [place], [place] [address]    Palliative care SW outreached patient to complete telephonic visit.    Palliative care SW outreached patients daughter, Efraim Kaufmann, to complete telephonic visit, after receiving no answer on patients home number. Patient provided update on medical condition and/or changes.    Daughter endorses that patients dementia is gradually getting worse, but appears to be the same since previous PC check in. Continues to be independent with ADL's and ambulate w/o AD. No falls reported.   Hospitalizations: None.   Memory loss: per daughter patients dementia is progressing.   Appetite: Patients appetite is fair. No significant weight changes noted.   Sleeping: Per daughter patient is sleeping/napping more; up to 8 hours during the day.   No medication needs or adjustments noted.    Psychosocial assessment: completed. No other psychosocial needs. No S/S of depression or anxiety noted. Patient's younger son lives with patient and spouse but works full time. Patient continues to drive, per daughter, which is a safety concern.   Daughter would like to schedule a home visit but advise for PC to schedule the visit with patient's spouse, Kurt Bailey (daughter states spouse maybe reluctant to schedule due to not wanting anyone in their home). SW outreached spouse and LVM. If no return call from spouse PC will do another telephonic check in the next 6-8 weeks.    Palliative care will continue to monitor and assist with long term care planning as needed.  SOCIAL HX:  Social History   Tobacco Use   Smoking status: Never   Smokeless tobacco: Never  Substance Use Topics   Alcohol use:  Not Currently    CODE STATUS: FULL CODE       [place], LCSW

## 2022-02-09 ENCOUNTER — Telehealth: Payer: Self-pay

## 2022-02-09 NOTE — Telephone Encounter (Signed)
PC SW outreached patients spouse to schedule in person PC visit.    Call unsuccessful. SW LVM.

## 2022-02-16 DIAGNOSIS — F01518 Vascular dementia, unspecified severity, with other behavioral disturbance: Secondary | ICD-10-CM | POA: Diagnosis not present

## 2022-02-16 DIAGNOSIS — Z6832 Body mass index (BMI) 32.0-32.9, adult: Secondary | ICD-10-CM | POA: Diagnosis not present

## 2022-02-16 DIAGNOSIS — E6609 Other obesity due to excess calories: Secondary | ICD-10-CM | POA: Diagnosis not present

## 2022-02-16 DIAGNOSIS — F02818 Dementia in other diseases classified elsewhere, unspecified severity, with other behavioral disturbance: Secondary | ICD-10-CM | POA: Diagnosis not present

## 2022-02-16 DIAGNOSIS — R011 Cardiac murmur, unspecified: Secondary | ICD-10-CM | POA: Diagnosis not present

## 2022-02-16 DIAGNOSIS — G309 Alzheimer's disease, unspecified: Secondary | ICD-10-CM | POA: Diagnosis not present

## 2022-02-16 DIAGNOSIS — E782 Mixed hyperlipidemia: Secondary | ICD-10-CM | POA: Diagnosis not present

## 2022-02-16 DIAGNOSIS — I10 Essential (primary) hypertension: Secondary | ICD-10-CM | POA: Diagnosis not present

## 2022-02-25 DIAGNOSIS — H6123 Impacted cerumen, bilateral: Secondary | ICD-10-CM | POA: Diagnosis not present

## 2022-02-25 DIAGNOSIS — H6063 Unspecified chronic otitis externa, bilateral: Secondary | ICD-10-CM | POA: Diagnosis not present

## 2022-03-01 DIAGNOSIS — R011 Cardiac murmur, unspecified: Secondary | ICD-10-CM | POA: Diagnosis not present

## 2022-03-16 ENCOUNTER — Ambulatory Visit (INDEPENDENT_AMBULATORY_CARE_PROVIDER_SITE_OTHER): Payer: Medicare Other | Admitting: Primary Care

## 2022-03-16 VITALS — BP 120/74 | HR 64 | Temp 98.6°F | Ht 68.0 in | Wt 207.0 lb

## 2022-03-16 DIAGNOSIS — E538 Deficiency of other specified B group vitamins: Secondary | ICD-10-CM

## 2022-03-16 DIAGNOSIS — F02818 Dementia in other diseases classified elsewhere, unspecified severity, with other behavioral disturbance: Secondary | ICD-10-CM | POA: Diagnosis not present

## 2022-03-16 DIAGNOSIS — E785 Hyperlipidemia, unspecified: Secondary | ICD-10-CM | POA: Diagnosis not present

## 2022-03-16 DIAGNOSIS — F01518 Vascular dementia, unspecified severity, with other behavioral disturbance: Secondary | ICD-10-CM

## 2022-03-16 DIAGNOSIS — Z125 Encounter for screening for malignant neoplasm of prostate: Secondary | ICD-10-CM | POA: Diagnosis not present

## 2022-03-16 DIAGNOSIS — R7303 Prediabetes: Secondary | ICD-10-CM

## 2022-03-16 DIAGNOSIS — B351 Tinea unguium: Secondary | ICD-10-CM | POA: Diagnosis not present

## 2022-03-16 DIAGNOSIS — G309 Alzheimer's disease, unspecified: Secondary | ICD-10-CM

## 2022-03-16 DIAGNOSIS — D539 Nutritional anemia, unspecified: Secondary | ICD-10-CM | POA: Diagnosis not present

## 2022-03-16 LAB — LIPID PANEL
Cholesterol: 116 mg/dL (ref 0–200)
HDL: 35.5 mg/dL — ABNORMAL LOW (ref >39.00)
LDL Cholesterol: 58 mg/dL (ref 0–99)
NonHDL: 80.62
Total CHOL/HDL Ratio: 3
Triglycerides: 112 mg/dL (ref 0.0–149.0)
VLDL: 22.4 mg/dL (ref 0.0–40.0)

## 2022-03-16 LAB — COMPREHENSIVE METABOLIC PANEL
ALT: 15 U/L (ref 0–53)
AST: 18 U/L (ref 0–37)
Albumin: 3.8 g/dL (ref 3.5–5.2)
Alkaline Phosphatase: 43 U/L (ref 39–117)
BUN: 13 mg/dL (ref 6–23)
CO2: 26 mEq/L (ref 19–32)
Calcium: 8.7 mg/dL (ref 8.4–10.5)
Chloride: 105 mEq/L (ref 96–112)
Creatinine, Ser: 0.99 mg/dL (ref 0.40–1.50)
GFR: 74.83 mL/min (ref >60.00)
Glucose, Bld: 103 mg/dL — ABNORMAL HIGH (ref 70–99)
Potassium: 3.6 mEq/L (ref 3.5–5.1)
Sodium: 140 mEq/L (ref 135–145)
Total Bilirubin: 0.5 mg/dL (ref 0.2–1.2)
Total Protein: 6.8 g/dL (ref 6.0–8.3)

## 2022-03-16 LAB — CBC
HCT: 41 % (ref 39.0–52.0)
Hemoglobin: 13.8 g/dL (ref 13.0–17.0)
MCHC: 33.6 g/dL (ref 30.0–36.0)
MCV: 103.7 fl — ABNORMAL HIGH (ref 78.0–100.0)
Platelets: 167 10*3/uL (ref 150.0–400.0)
RBC: 3.95 Mil/uL — ABNORMAL LOW (ref 4.22–5.81)
RDW: 14.9 % (ref 11.5–15.5)
WBC: 8.6 10*3/uL (ref 4.0–10.5)

## 2022-03-16 LAB — HEMOGLOBIN A1C: Hgb A1c MFr Bld: 6.1 % (ref 4.6–6.5)

## 2022-03-16 LAB — PSA, MEDICARE: PSA: 0.65 ng/ml (ref 0.10–4.00)

## 2022-03-16 LAB — VITAMIN B12: Vitamin B-12: 765 pg/mL (ref 211–911)

## 2022-03-16 NOTE — Assessment & Plan Note (Signed)
Following with dermatology.  He will complete his terbinafine 250 mg soon. Complete regimen as prescribed.

## 2022-03-16 NOTE — Patient Instructions (Signed)
Stop by the lab prior to leaving today. I will notify you of your results once received.   It was a pleasure to see you today!  

## 2022-03-16 NOTE — Assessment & Plan Note (Signed)
Continue atorvastatin 40 mg daily. Repeat lipid panel pending. 

## 2022-03-16 NOTE — Progress Notes (Signed)
Subjective:    Patient ID: Kurt Bailey, male    DOB: 1945/10/06, 76 y.o.   MRN: [id number]  HPI  Kurt Bailey is a very pleasant 76 y.o. 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   His wife joins [place] today who is providing most of the information for HPI. He and his wife decline any health maintenance such as vaccines and routine screening.   1) Mixed Alzheimer's and Vascular Dementia: Currently managed on memantine 10 mg BID, Depakote 500 mg BID. Following with neurology, last office visit was in March 2023.   His wife endorses gradual progression in forgetfulness and overall short term memory. He bathes and dresses himself for the most part. His wife prepares all meals.   2) Tinea unguium: Currently managed on terbinafine 250 mg daily. Following with dermatology. Evaluated last in June 2023.   3) Hyperlipidemia: Currently managed on atorvastatin 40 mg daily. Due for repeat lipid panel today.   4) Cardiac Murmur: Following with cardiology, recently established care in July 2023, echocardiogram recommended. Echocardiogram completed which revealed normal LFEF >55%, normal right ventricular systolic function, mild aortic valve stenosis, mild tricuspid and mitral valve insufficiency, mild RV and LA enlargement.   He denies chest pain, shortness of breath. They have yet to hear back regarding the results of his echocardiogram.   Review of Systems  Respiratory:  Negative for shortness of breath.   Cardiovascular:  Negative for chest pain.  Psychiatric/Behavioral:  The patient is nervous/anxious.          Past Medical History:  Diagnosis Date   Acute hypoxemic respiratory failure due to COVID-19 [facility]) 05/13/2020   Alcohol abuse    Diverticulitis    Periumbilical hernia     Social History    Socioeconomic History   Marital status: Married    Spouse name: Not on file   Number of children: Not on file   Years of education: Not on file   Highest education level: Not on file  Occupational History   Not on file  Tobacco Use   Smoking status: Never   Smokeless tobacco: Never  Vaping Use   Vaping Use: Never used  Substance and Sexual Activity   Alcohol use: Not Currently   Drug use: Never   Sexual activity: Not on file  Other Topics Concern   Not on file  Social History Narrative   Married.   4 children.   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   Enjoys relaxing, walking.   Social Determinants of Health   Financial Resource Strain: Low Risk  (01/01/2022)   Overall Financial Resource Strain (CARDIA)    Difficulty of Paying Living Expenses: Not hard at all  Food Insecurity: No Food Insecurity (01/01/2022)   Hunger Vital Sign    Worried About Running Out of Food in the Last Year: Never true    Ran Out of Food in the Last Year: Never true  Transportation Needs: No Transportation Needs (01/01/2022)   PRAPARE - Administrator, Civil Service (Medical): No    Lack of Transportation (Non-Medical): No  Physical Activity: Inactive (01/01/2022)   Exercise Vital Sign    Days of Exercise per Week: 0 days    Minutes of Exercise per Session: 0 min  Stress: Not on file  Social Connections: Not on file  Intimate Partner Violence: Not on  file    No past surgical history on file.  Family History  Problem Relation Age of Onset   Alcohol abuse Father    Stroke Father    Hypertension Father    Emphysema Father    Breast cancer Sister    Heart attack Brother    Aneurysm Sister    Stroke Brother    Hyperlipidemia Brother     No Known Allergies  Current Outpatient Medications on File Prior to Visit  Medication Sig Dispense Refill   acetaminophen (TYLENOL) 500 MG tablet Take 500 mg by mouth every 6 (six) hours as needed.     atorvastatin (LIPITOR) 40 MG tablet Take 1  tablet (40 mg total) by mouth daily. For cholesterol 90 tablet 3   cyanocobalamin 1000 MCG tablet Take by mouth.     divalproex (DEPAKOTE) 500 MG DR tablet Take 500 mg by mouth 2 (two) times daily.     Omega-3 1000 MG CAPS Take 1,000 mg by mouth every evening.      terbinafine (LAMISIL) 250 MG tablet Take 1 tablet (250 mg total) by mouth daily. 30 tablet 1   vitamin B-12 (CYANOCOBALAMIN) 1000 MCG tablet Take 1,000 mcg by mouth every evening.      memantine (NAMENDA) 10 MG tablet Take 10 mg by mouth 2 (two) times daily.      No current facility-administered medications on file prior to visit.    BP 120/74   Pulse 64   Temp 98.6 F (37 C) (Oral)   Ht 5\' 8"  (1.727 m)   Wt 207 lb (93.9 kg)   SpO2 95%   BMI 31.47 kg/m  Objective:   Physical Exam Cardiovascular:     Rate and Rhythm: Normal rate and regular rhythm.  Pulmonary:     Effort: Pulmonary effort is normal.     Breath sounds: Normal breath sounds. No wheezing or rales.  Musculoskeletal:     Cervical back: Neck supple.  Skin:    General: Skin is warm and dry.  Neurological:     Mental Status: He is alert.     Comments: Answers some questions appropriately.   Psychiatric:        Mood and Affect: Mood normal.           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  Continue Depakote 500 mg BID, Namenda 10 mg BID.        Musculoskeletal and Integument   Tinea unguium    Following with dermatology.  He will complete his terbinafine 250 mg soon. Complete regimen as prescribed.        Other   Vitamin B 12 deficiency    Continue vitamin B12 1000 mcg daily. Repeat B12 level pending.      Relevant Orders   Vitamin B12   Hyperlipidemia    Continue atorvastatin 40 mg daily. Repeat lipid panel pending.       Relevant Orders   Lipid panel   Comprehensive metabolic panel   CBC   Pre-diabetes - Primary    Discussed the importance of a healthy diet and regular exercise in order for weight loss, and to reduce the risk of further co-morbidity.  Repeat A1C pending.      Relevant Orders   Hemoglobin A1c   Other Visit  Diagnoses     Screening for prostate cancer       Relevant Orders   PSA, Medicare          Doreene Nest, NP

## 2022-03-16 NOTE — Assessment & Plan Note (Signed)
Discussed the importance of a healthy diet and regular exercise in order for weight loss, and to reduce the risk of further co-morbidity. ? ?Repeat A1C pending. ?

## 2022-03-16 NOTE — Assessment & Plan Note (Signed)
Following with neurology, office notes reviewed from March 2023.  Continue Depakote 500 mg BID, Namenda 10 mg BID.

## 2022-03-16 NOTE — Assessment & Plan Note (Signed)
Continue vitamin B12 1000 mcg daily. ?Repeat B12 level pending. ? ?

## 2022-03-17 ENCOUNTER — Other Ambulatory Visit (INDEPENDENT_AMBULATORY_CARE_PROVIDER_SITE_OTHER): Payer: Medicare Other

## 2022-03-17 DIAGNOSIS — D539 Nutritional anemia, unspecified: Secondary | ICD-10-CM

## 2022-03-17 LAB — IBC + FERRITIN
Ferritin: 79.6 ng/mL (ref 22.0–322.0)
Iron: 117 ug/dL (ref 42–165)
Saturation Ratios: 33.2 % (ref 20.0–50.0)
TIBC: 352.8 ug/dL (ref 250.0–450.0)
Transferrin: 252 mg/dL (ref 212.0–360.0)

## 2022-03-17 NOTE — Addendum Note (Signed)
Addended by: Alvina Chou on: 03/17/2022 07:44 AM   Modules accepted: Orders

## 2022-03-18 LAB — PATHOLOGIST SMEAR REVIEW

## 2022-03-30 ENCOUNTER — Telehealth: Payer: Self-pay | Admitting: Primary Care

## 2022-03-30 DIAGNOSIS — D7589 Other specified diseases of blood and blood-forming organs: Secondary | ICD-10-CM

## 2022-03-30 NOTE — Telephone Encounter (Signed)
Please notify patient and his wife that I've been in contact with a hematologist given his recent blood tests. I need to repeat one of his blood tests.   We suspect that his abnormality is from the Depakote, but we need to make sure. Lab only appointment is fine. Non fasting.

## 2022-03-30 NOTE — Telephone Encounter (Signed)
Patient notified as instructed by telephone and verbalized understanding. Patient stated that he will have to get his wife to call back and schedule the lab appointment. When patient's wife calls back he needs to be scheduled for a non fasting lab appointment.

## 2022-03-31 ENCOUNTER — Other Ambulatory Visit (INDEPENDENT_AMBULATORY_CARE_PROVIDER_SITE_OTHER): Payer: Medicare Other

## 2022-03-31 DIAGNOSIS — D7589 Other specified diseases of blood and blood-forming organs: Secondary | ICD-10-CM | POA: Diagnosis not present

## 2022-03-31 LAB — CBC WITH DIFFERENTIAL/PLATELET
Basophils Absolute: 0.1 10*3/uL (ref 0.0–0.1)
Basophils Relative: 0.9 % (ref 0.0–3.0)
Eosinophils Absolute: 2.2 10*3/uL — ABNORMAL HIGH (ref 0.0–0.7)
Eosinophils Relative: 27.8 % — ABNORMAL HIGH (ref 0.0–5.0)
HCT: 42.4 % (ref 39.0–52.0)
Hemoglobin: 14.3 g/dL (ref 13.0–17.0)
Lymphocytes Relative: 25 % (ref 12.0–46.0)
Lymphs Abs: 2 10*3/uL (ref 0.7–4.0)
MCHC: 33.7 g/dL (ref 30.0–36.0)
MCV: 104.5 fl — ABNORMAL HIGH (ref 78.0–100.0)
Monocytes Absolute: 0.8 10*3/uL (ref 0.1–1.0)
Monocytes Relative: 10.4 % (ref 3.0–12.0)
Neutro Abs: 2.8 10*3/uL (ref 1.4–7.7)
Neutrophils Relative %: 35.9 % — ABNORMAL LOW (ref 43.0–77.0)
Platelets: 167 10*3/uL (ref 150.0–400.0)
RBC: 4.06 Mil/uL — ABNORMAL LOW (ref 4.22–5.81)
RDW: 14.6 % (ref 11.5–15.5)
WBC: 8 10*3/uL (ref 4.0–10.5)

## 2022-03-31 NOTE — Telephone Encounter (Signed)
Reviewed patients chart, labs were done today in office. No further action needed.

## 2022-04-01 ENCOUNTER — Other Ambulatory Visit: Payer: Self-pay | Admitting: Primary Care

## 2022-04-01 DIAGNOSIS — E785 Hyperlipidemia, unspecified: Secondary | ICD-10-CM

## 2022-04-29 ENCOUNTER — Other Ambulatory Visit: Payer: Self-pay | Admitting: Primary Care

## 2022-04-29 DIAGNOSIS — D539 Nutritional anemia, unspecified: Secondary | ICD-10-CM

## 2022-04-29 DIAGNOSIS — D7589 Other specified diseases of blood and blood-forming organs: Secondary | ICD-10-CM

## 2022-04-29 DIAGNOSIS — R7989 Other specified abnormal findings of blood chemistry: Secondary | ICD-10-CM

## 2022-04-30 DIAGNOSIS — G309 Alzheimer's disease, unspecified: Secondary | ICD-10-CM | POA: Diagnosis not present

## 2022-04-30 DIAGNOSIS — R2689 Other abnormalities of gait and mobility: Secondary | ICD-10-CM | POA: Diagnosis not present

## 2022-04-30 DIAGNOSIS — F02818 Dementia in other diseases classified elsewhere, unspecified severity, with other behavioral disturbance: Secondary | ICD-10-CM | POA: Diagnosis not present

## 2022-04-30 DIAGNOSIS — F01518 Vascular dementia, unspecified severity, with other behavioral disturbance: Secondary | ICD-10-CM | POA: Diagnosis not present

## 2022-04-30 DIAGNOSIS — D539 Nutritional anemia, unspecified: Secondary | ICD-10-CM | POA: Diagnosis not present

## 2022-04-30 DIAGNOSIS — Z79899 Other long term (current) drug therapy: Secondary | ICD-10-CM | POA: Diagnosis not present

## 2022-05-03 ENCOUNTER — Inpatient Hospital Stay: Payer: Medicare Other | Attending: Internal Medicine | Admitting: Internal Medicine

## 2022-05-03 ENCOUNTER — Inpatient Hospital Stay: Payer: Medicare Other

## 2022-05-03 ENCOUNTER — Encounter: Payer: Self-pay | Admitting: Internal Medicine

## 2022-05-03 VITALS — BP 136/83 | HR 65 | Temp 98.2°F | Resp 18 | Wt 209.0 lb

## 2022-05-03 DIAGNOSIS — D721 Eosinophilia, unspecified: Secondary | ICD-10-CM | POA: Diagnosis not present

## 2022-05-03 DIAGNOSIS — F0280 Dementia in other diseases classified elsewhere without behavioral disturbance: Secondary | ICD-10-CM | POA: Diagnosis not present

## 2022-05-03 DIAGNOSIS — F0150 Vascular dementia without behavioral disturbance: Secondary | ICD-10-CM | POA: Diagnosis not present

## 2022-05-03 DIAGNOSIS — E538 Deficiency of other specified B group vitamins: Secondary | ICD-10-CM | POA: Diagnosis not present

## 2022-05-03 DIAGNOSIS — G309 Alzheimer's disease, unspecified: Secondary | ICD-10-CM | POA: Insufficient documentation

## 2022-05-03 DIAGNOSIS — D7589 Other specified diseases of blood and blood-forming organs: Secondary | ICD-10-CM | POA: Insufficient documentation

## 2022-05-03 LAB — CBC WITH DIFFERENTIAL/PLATELET
Abs Immature Granulocytes: 0.07 10*3/uL (ref 0.00–0.07)
Basophils Absolute: 0.1 10*3/uL (ref 0.0–0.1)
Basophils Relative: 1 %
Eosinophils Absolute: 1.8 10*3/uL — ABNORMAL HIGH (ref 0.0–0.5)
Eosinophils Relative: 22 %
HCT: 41.7 % (ref 39.0–52.0)
Hemoglobin: 14.3 g/dL (ref 13.0–17.0)
Immature Granulocytes: 1 %
Lymphocytes Relative: 26 %
Lymphs Abs: 2.1 10*3/uL (ref 0.7–4.0)
MCH: 34.5 pg — ABNORMAL HIGH (ref 26.0–34.0)
MCHC: 34.3 g/dL (ref 30.0–36.0)
MCV: 100.7 fL — ABNORMAL HIGH (ref 80.0–100.0)
Monocytes Absolute: 0.9 10*3/uL (ref 0.1–1.0)
Monocytes Relative: 11 %
Neutro Abs: 3.3 10*3/uL (ref 1.7–7.7)
Neutrophils Relative %: 39 %
Platelets: 162 10*3/uL (ref 150–400)
RBC: 4.14 MIL/uL — ABNORMAL LOW (ref 4.22–5.81)
RDW: 13.7 % (ref 11.5–15.5)
WBC: 8.3 10*3/uL (ref 4.0–10.5)
nRBC: 0 % (ref 0.0–0.2)

## 2022-05-03 LAB — RETICULOCYTES
Immature Retic Fract: 20.9 % — ABNORMAL HIGH (ref 2.3–15.9)
RBC.: 4.09 MIL/uL — ABNORMAL LOW (ref 4.22–5.81)
Retic Count, Absolute: 108 10*3/uL (ref 19.0–186.0)
Retic Ct Pct: 2.6 % (ref 0.4–3.1)

## 2022-05-03 LAB — FOLATE: Folate: 12.1 ng/mL (ref >5.9)

## 2022-05-03 LAB — LACTATE DEHYDROGENASE: LDH: 188 U/L (ref 98–192)

## 2022-05-03 NOTE — Progress Notes (Signed)
Patient here today for initial evaluation regarding anemia.  

## 2022-05-03 NOTE — Progress Notes (Addendum)
[facility]  [phone] Fax:(336) [phone]  ID: Kurt Bailey OB: 1946/07/17  MR#: [id number]  DPO#:[id number]  Patient Care Team: Doreene Nest, NP as PCP - General (Internal Medicine)  REFERRING PROVIDER: [name], NP  REASON FOR REFERRAL: macrocytosis without anemia   HPI: Kurt Bailey is a 76 y.o. 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  Patient reports feeling well overall.  He had diarrhea for few days which has resolved.  He thinks he might have eaten something.  He has weight gain.  Denies any fever, chills, night sweats, weight loss, shortness of breath, chest pain, bladder issues.  Denies any lumps.  Labs reviewed.  MCV elevated dating back to 2014 ranging 100 to 105.  WBC, hemoglobin and platelets are within normal range. From August 2023, vitamin B12 765, iron panel normal.  Pathologist smear review showed macrocytosis, myeloid population consists predominantly of mature segmented neutrophils with reactive changes, absolute eosinophilia.  Platelet clumps noted and count appears adequate.  CBC with differential from 03/31/2022 showed absolute eosinophil count of 2.2.  No prior history of eosinophilia.  REVIEW OF SYSTEMS:   ROS  As per HPI. Otherwise, a complete review of systems is negative.  PAST MEDICAL HISTORY: Past Medical History:  Diagnosis Date   Acute hypoxemic respiratory failure due to COVID-19 (HCC) 05/13/2020   Alcohol abuse    Diverticulitis    Periumbilical hernia     PAST SURGICAL HISTORY: History reviewed. No pertinent surgical history.  FAMILY HISTORY: Family History  Problem Relation Age of Onset   Alcohol abuse Father    Stroke Father    Hypertension Father    Emphysema Father    Breast cancer Sister    Heart attack Brother     Aneurysm Sister    Stroke Brother    Hyperlipidemia Brother     HEALTH MAINTENANCE: Social History   Tobacco Use   Smoking status: Never   Smokeless tobacco: Never  Vaping Use   Vaping Use: Never used  Substance Use Topics   Alcohol use: Not Currently   Drug use: Never     No Known Allergies  Current Outpatient Medications  Medication Sig Dispense Refill   acetaminophen (TYLENOL) 500 MG tablet Take 500 mg by mouth every 6 (six) hours as needed.     atorvastatin (LIPITOR) 40 MG tablet TAKE 1 TABLET BY MOUTH ONCE DAILY FOR CHOLESTEROL 90 tablet 3   cyanocobalamin 1000 MCG tablet Take by mouth.     divalproex (DEPAKOTE) 500 MG DR tablet Take 500 mg by mouth 2 (two) times daily.     Omega-3 1000 MG CAPS Take 1,000 mg by mouth every evening.      terbinafine (LAMISIL) 250 MG tablet Take 1 tablet (250 mg total) by mouth daily. 30 tablet 1   vitamin B-12 (CYANOCOBALAMIN) 1000 MCG tablet Take 1,000 mcg by mouth every evening.      memantine (NAMENDA) 10 MG tablet Take 10 mg by mouth 2 (two) times daily.      No current facility-administered medications for this visit.    OBJECTIVE: There were no vitals filed for this visit.   There is no height or weight on file to calculate BMI.      General: Well-developed, well-nourished, no acute distress. Eyes: Pink conjunctiva, anicteric sclera. HEENT: Normocephalic, moist mucous membranes, clear oropharnyx. Lungs:  Clear to auscultation bilaterally. Heart: Regular rate and rhythm. No rubs, murmurs, or gallops. Abdomen: Soft, nontender, nondistended. No organomegaly noted, normoactive bowel sounds. Musculoskeletal: No edema, cyanosis, or clubbing. Neuro: Alert, answering all questions appropriately. Cranial nerves grossly intact. Skin: No rashes or petechiae noted. Psych: Normal affect. Lymphatics: No cervical, calvicular, axillary or inguinal LAD.   LAB RESULTS:  Lab Results  Component Value Date   NA 140 03/16/2022   K 3.6  03/16/2022   CL 105 03/16/2022   CO2 26 03/16/2022   GLUCOSE 103 (H) 03/16/2022   BUN 13 03/16/2022   CREATININE 0.99 03/16/2022   CALCIUM 8.7 03/16/2022   PROT 6.8 03/16/2022   ALBUMIN 3.8 03/16/2022   AST 18 03/16/2022   ALT 15 03/16/2022   ALKPHOS 43 03/16/2022   BILITOT 0.5 03/16/2022   GFRNONAA >60 05/15/2020   GFRAA >60 03/13/2014    Lab Results  Component Value Date   WBC 8.0 03/31/2022   NEUTROABS 2.8 03/31/2022   HGB 14.3 03/31/2022   HCT 42.4 03/31/2022   MCV 104.5 (H) 03/31/2022   PLT 167.0 03/31/2022    Lab Results  Component Value Date   TIBC 352.8 03/17/2022   FERRITIN 79.6 03/17/2022   FERRITIN 499 (H) 05/14/2020   IRONPCTSAT 33.2 03/17/2022     STUDIES: No results found.  ASSESSMENT AND PLAN:   Kurt Bailey is a 76 y.o. 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  #Macrocytosis without anemia  -Unknown etiology -Labs reviewed. MCV elevated dating back to 2014 ranging 100 to 105.  WBC, hemoglobin and platelets are within normal range. -From August 2023, vitamin B12 765, iron panel normal.  Pathologist smear review showed macrocytosis, myeloid population consists predominantly of mature segmented neutrophils with reactive changes, absolute eosinophilia.  Platelet clumps noted and count appears adequate. -Denies any liver disease.  LFTs are normal.  Quit alcohol in 1985. -Medication list reviewed.  No offending agent identified. -Labs below for further work-up  Orders Placed This Encounter  Procedures   Thyroid Panel With TSH   Folate   CBC with Differential   Reticulocytes   Lactate dehydrogenase   Haptoglobin   Copper, serum   # Eosinophilia  -CBC with differential from 03/31/2022 showed absolute eosinophil count of 2.2.  AEC from 12/2020 1.24 -Patient denies any allergy.  Denies any B  symptoms. -Repeat CBC with differential.  RTC in 2 weeks for MD visit to discuss labs.  Patient expressed understanding and was in agreement with this plan. He also understands that He can call clinic at any time with any questions, concerns, or complaints.   I spent a total of 45 minutes reviewing chart data, face-to-face evaluation with the patient, counseling and coordination of care as detailed above.  Jane Canary, MD   05/03/2022 11:14 AM

## 2022-05-04 LAB — HAPTOGLOBIN: Haptoglobin: 91 mg/dL (ref 34–355)

## 2022-05-04 LAB — THYROID PANEL WITH TSH
Free Thyroxine Index: 1.9 (ref 1.2–4.9)
T3 Uptake Ratio: 25 % (ref 24–39)
T4, Total: 7.6 ug/dL (ref 4.5–12.0)
TSH: 4.25 u[IU]/mL (ref 0.450–4.500)

## 2022-05-11 LAB — COPPER, SERUM: Copper: 94 ug/dL (ref 69–132)

## 2022-05-17 ENCOUNTER — Encounter: Payer: Self-pay | Admitting: Internal Medicine

## 2022-05-17 ENCOUNTER — Inpatient Hospital Stay (HOSPITAL_BASED_OUTPATIENT_CLINIC_OR_DEPARTMENT_OTHER): Payer: Medicare Other | Admitting: Internal Medicine

## 2022-05-17 VITALS — BP 146/85 | HR 58 | Temp 97.3°F | Wt 207.0 lb

## 2022-05-17 DIAGNOSIS — D7589 Other specified diseases of blood and blood-forming organs: Secondary | ICD-10-CM

## 2022-05-17 DIAGNOSIS — E538 Deficiency of other specified B group vitamins: Secondary | ICD-10-CM | POA: Diagnosis not present

## 2022-05-17 DIAGNOSIS — F0280 Dementia in other diseases classified elsewhere without behavioral disturbance: Secondary | ICD-10-CM | POA: Diagnosis not present

## 2022-05-17 DIAGNOSIS — D721 Eosinophilia, unspecified: Secondary | ICD-10-CM

## 2022-05-17 DIAGNOSIS — G309 Alzheimer's disease, unspecified: Secondary | ICD-10-CM | POA: Diagnosis not present

## 2022-05-17 DIAGNOSIS — F0150 Vascular dementia without behavioral disturbance: Secondary | ICD-10-CM | POA: Diagnosis not present

## 2022-05-17 NOTE — Progress Notes (Signed)
[facility]  Telephone:(336) [phone] Fax:(336) [phone]  ID: Nyra Capes OB: 16-Jan-1946  MR#: [id number]  NOB#:[id number]  Patient Care Team: Pleas Koch, NP as PCP - General (Internal Medicine)  REFERRING PROVIDER: Alma Friendly, NP  REASON FOR REFERRAL: macrocytosis without anemia   HPI: Kurt Bailey is a 76 y.o. 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  Patient reports feeling well overall.  He had diarrhea for few days which has resolved.  He thinks he might have eaten something.  He has weight gain.  Denies any fever, chills, night sweats, weight loss, shortness of breath, chest pain, bladder issues.  Denies any lumps.  Labs reviewed.  MCV elevated dating back to 2014 ranging 100 to 105.  WBC, hemoglobin and platelets are within normal range. From August 2023, vitamin B12 765, iron panel normal.  Pathologist smear review showed macrocytosis, myeloid population consists predominantly of mature segmented neutrophils with reactive changes, absolute eosinophilia.  Platelet clumps noted and count appears adequate.  CBC with differential from 03/31/2022 showed absolute eosinophil count of 2.2.  INTERVAL HISTORY-  Patient seen today accompanied by his wife to discuss about the lab results for macrocytosis.  He has been feeling well overall.  Denies any new concerns. Patient denies fever, chills, nausea, vomiting, shortness of breath, cough, abdominal pain, bleeding, bowel or bladder issues. Energy level is good.  Appetite is good.  Denies any weight loss. Denies pain.   REVIEW OF SYSTEMS:   Review of Systems  Constitutional:  Negative for chills, fever and weight loss.  Respiratory:  Negative for cough and shortness of breath.   Cardiovascular:  Negative for chest pain and leg swelling.   Gastrointestinal:  Negative for nausea and vomiting.  Neurological:  Negative for dizziness and headaches.  Psychiatric/Behavioral:  Positive for memory loss.     As per HPI. Otherwise, a complete review of systems is negative.  PAST MEDICAL HISTORY: Past Medical History:  Diagnosis Date   Acute hypoxemic respiratory failure due to COVID-19 ([place]) 05/13/2020   Alcohol abuse    Diverticulitis    Periumbilical hernia     PAST SURGICAL HISTORY: History reviewed. No pertinent surgical history.  FAMILY HISTORY: Family History  Problem Relation Age of Onset   Alcohol abuse Father    Stroke Father    Hypertension Father    Emphysema Father    Breast cancer Sister    Heart attack Brother    Aneurysm Sister    Stroke Brother    Hyperlipidemia Brother     HEALTH MAINTENANCE: Social History   Tobacco Use   Smoking status: Never   Smokeless tobacco: Never  Vaping Use   Vaping Use: Never used  Substance Use Topics   Alcohol use: Not Currently   Drug use: Never     No Known Allergies  Current Outpatient Medications  Medication Sig Dispense Refill   acetaminophen (TYLENOL) 500 MG tablet Take 500 mg by mouth every 6 (six) hours as needed.     atorvastatin (LIPITOR) 40 MG tablet TAKE 1 TABLET BY MOUTH ONCE DAILY FOR CHOLESTEROL 90 tablet 3   cyanocobalamin 1000 MCG tablet Take by mouth.     divalproex (DEPAKOTE) 500 MG DR tablet Take 500 mg by mouth 2 (two) times daily.     Omega-3 1000 MG CAPS Take 1,000 mg by mouth every evening.  terbinafine (LAMISIL) 250 MG tablet Take 1 tablet (250 mg total) by mouth daily. 30 tablet 1   vitamin B-12 (CYANOCOBALAMIN) 1000 MCG tablet Take 1,000 mcg by mouth every evening.      memantine (NAMENDA) 10 MG tablet Take 10 mg by mouth 2 (two) times daily.      No current facility-administered medications for this visit.    OBJECTIVE: Vitals:   05/17/22 1046  BP: (!) 146/85  Pulse: (!) 58  Temp: (!) 97.3 F (36.3 C)     Body  mass index is 31.47 kg/m.      PE not performed since no new concerns raised.   LAB RESULTS:  Lab Results  Component Value Date   NA 140 03/16/2022   K 3.6 03/16/2022   CL 105 03/16/2022   CO2 26 03/16/2022   GLUCOSE 103 (H) 03/16/2022   BUN 13 03/16/2022   CREATININE 0.99 03/16/2022   CALCIUM 8.7 03/16/2022   PROT 6.8 03/16/2022   ALBUMIN 3.8 03/16/2022   AST 18 03/16/2022   ALT 15 03/16/2022   ALKPHOS 43 03/16/2022   BILITOT 0.5 03/16/2022   GFRNONAA >60 05/15/2020   GFRAA >60 03/13/2014    Lab Results  Component Value Date   WBC 8.3 05/03/2022   NEUTROABS 3.3 05/03/2022   HGB 14.3 05/03/2022   HCT 41.7 05/03/2022   MCV 100.7 (H) 05/03/2022   PLT 162 05/03/2022    Lab Results  Component Value Date   TIBC 352.8 03/17/2022   FERRITIN 79.6 03/17/2022   FERRITIN 499 (H) 05/14/2020   IRONPCTSAT 33.2 03/17/2022     STUDIES: No results found.  ASSESSMENT AND PLAN:   Kurt Bailey is a 76 y.o. 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  #Macrocytosis without anemia  -Unknown etiology -Labs reviewed. MCV elevated dating back to 2014 ranging 100 to 105.  WBC, hemoglobin and platelets are within normal range. -From August 2023, Pathologist smear review showed macrocytosis, myeloid population consists predominantly of mature segmented neutrophils with reactive changes, absolute eosinophilia.  Platelet clumps noted and count appears adequate. -Denies any liver disease.  LFTs are normal.  Quit alcohol in 1985. -Medication list reviewed.  No offending agent identified. -Lab work done to assess for etiology of macrocytosis was unremarkable including TSH, B12, folate, reticulocyte count, hemolytic panel, copper.  Discussed with the patient and wife about the lab results being unremarkable.  Considering he has macrocytosis for  almost a decade without any cytopenias it is reasonable to do surveillance.  No indication for bone marrow biopsy at this time.  # Eosinophilia  - present since 12/2020 AEC 1.24. [place] 03/31/2022 showed AEC of 2.2.  [place] 05/2022 from 1.8 -Patient denies any allergy.  Denies any B symptoms. - overall count is stable. Continue to monitor.    RTC in 3 months for MD visit, labs   Patient expressed understanding and was in agreement with this plan. He also understands that He can call clinic at any time with any questions, concerns, or complaints.   I spent a total of 25 minutes reviewing chart data, face-to-face evaluation with the patient, counseling and coordination of care as detailed above.  Jane Canary, MD   05/17/2022 1:06 PM

## 2022-05-17 NOTE — Progress Notes (Signed)
Patient here for follow up to go over lab results. Patient denies any concerns at this time.

## 2022-06-30 DIAGNOSIS — G309 Alzheimer's disease, unspecified: Secondary | ICD-10-CM | POA: Diagnosis not present

## 2022-06-30 DIAGNOSIS — Z6832 Body mass index (BMI) 32.0-32.9, adult: Secondary | ICD-10-CM | POA: Diagnosis not present

## 2022-06-30 DIAGNOSIS — F01518 Vascular dementia, unspecified severity, with other behavioral disturbance: Secondary | ICD-10-CM | POA: Diagnosis not present

## 2022-06-30 DIAGNOSIS — E6609 Other obesity due to excess calories: Secondary | ICD-10-CM | POA: Diagnosis not present

## 2022-06-30 DIAGNOSIS — F02818 Dementia in other diseases classified elsewhere, unspecified severity, with other behavioral disturbance: Secondary | ICD-10-CM | POA: Diagnosis not present

## 2022-06-30 DIAGNOSIS — E782 Mixed hyperlipidemia: Secondary | ICD-10-CM | POA: Diagnosis not present

## 2022-06-30 DIAGNOSIS — R011 Cardiac murmur, unspecified: Secondary | ICD-10-CM | POA: Diagnosis not present

## 2022-06-30 DIAGNOSIS — I10 Essential (primary) hypertension: Secondary | ICD-10-CM | POA: Diagnosis not present

## 2022-08-26 ENCOUNTER — Inpatient Hospital Stay (HOSPITAL_BASED_OUTPATIENT_CLINIC_OR_DEPARTMENT_OTHER): Payer: Medicare Other | Admitting: Internal Medicine

## 2022-08-26 ENCOUNTER — Inpatient Hospital Stay: Payer: Medicare Other | Attending: Internal Medicine

## 2022-08-26 ENCOUNTER — Encounter: Payer: Self-pay | Admitting: Internal Medicine

## 2022-08-26 VITALS — BP 126/80 | HR 70 | Temp 96.9°F | Resp 20 | Wt 203.0 lb

## 2022-08-26 DIAGNOSIS — D7589 Other specified diseases of blood and blood-forming organs: Secondary | ICD-10-CM

## 2022-08-26 DIAGNOSIS — D721 Eosinophilia, unspecified: Secondary | ICD-10-CM | POA: Insufficient documentation

## 2022-08-26 DIAGNOSIS — Z79899 Other long term (current) drug therapy: Secondary | ICD-10-CM | POA: Diagnosis not present

## 2022-08-26 LAB — CBC WITH DIFFERENTIAL/PLATELET
Abs Immature Granulocytes: 0.13 10*3/uL — ABNORMAL HIGH (ref 0.00–0.07)
Basophils Absolute: 0.1 10*3/uL (ref 0.0–0.1)
Basophils Relative: 1 %
Eosinophils Absolute: 2.8 10*3/uL — ABNORMAL HIGH (ref 0.0–0.5)
Eosinophils Relative: 26 %
HCT: 42.9 % (ref 39.0–52.0)
Hemoglobin: 14.1 g/dL (ref 13.0–17.0)
Immature Granulocytes: 1 %
Lymphocytes Relative: 24 %
Lymphs Abs: 2.5 10*3/uL (ref 0.7–4.0)
MCH: 34.6 pg — ABNORMAL HIGH (ref 26.0–34.0)
MCHC: 32.9 g/dL (ref 30.0–36.0)
MCV: 105.4 fL — ABNORMAL HIGH (ref 80.0–100.0)
Monocytes Absolute: 1.2 10*3/uL — ABNORMAL HIGH (ref 0.1–1.0)
Monocytes Relative: 12 %
Neutro Abs: 3.7 10*3/uL (ref 1.7–7.7)
Neutrophils Relative %: 36 %
Platelets: 183 10*3/uL (ref 150–400)
RBC: 4.07 MIL/uL — ABNORMAL LOW (ref 4.22–5.81)
RDW: 13.7 % (ref 11.5–15.5)
WBC: 10.5 10*3/uL (ref 4.0–10.5)
nRBC: 0 % (ref 0.0–0.2)

## 2022-08-26 LAB — COMPREHENSIVE METABOLIC PANEL
ALT: 19 U/L (ref 0–44)
AST: 25 U/L (ref 15–41)
Albumin: 3.4 g/dL — ABNORMAL LOW (ref 3.5–5.0)
Alkaline Phosphatase: 51 U/L (ref 38–126)
Anion gap: 10 (ref 5–15)
BUN: 15 mg/dL (ref 8–23)
CO2: 26 mmol/L (ref 22–32)
Calcium: 8.5 mg/dL — ABNORMAL LOW (ref 8.9–10.3)
Chloride: 102 mmol/L (ref 98–111)
Creatinine, Ser: 1.12 mg/dL (ref 0.61–1.24)
GFR, Estimated: >60 mL/min (ref >60)
Glucose, Bld: 103 mg/dL — ABNORMAL HIGH (ref 70–99)
Potassium: 3.6 mmol/L (ref 3.5–5.1)
Sodium: 138 mmol/L (ref 135–145)
Total Bilirubin: 0.5 mg/dL (ref 0.3–1.2)
Total Protein: 7.3 g/dL (ref 6.5–8.1)

## 2022-08-26 NOTE — Progress Notes (Signed)
[place]  [phone] [phone]) [phone]  ID: Kurt Bailey OB: 25-Aug-1945  MR#: [id number]  QAS#:[id number]  Patient Care Team: Pleas Koch, NP as PCP - General (Internal Medicine)  REFERRING PROVIDER: Alma Friendly, NP  REASON FOR REFERRAL: macrocytosis without anemia   HPI: Kurt Bailey is a 77 y.o. 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  Patient reports feeling well overall.  He had diarrhea for few days which has resolved.  He thinks he might have eaten something.  He has weight gain.  Denies any fever, chills, night sweats, weight loss, shortness of breath, chest pain, bladder issues.  Denies any lumps.  Labs reviewed.  MCV elevated dating back to 2014 ranging 100 to 105.  WBC, hemoglobin and platelets are within normal range. From August 2023, vitamin B12 765, iron panel normal.  Pathologist smear review showed macrocytosis, myeloid population consists predominantly of mature segmented neutrophils with reactive changes, absolute eosinophilia.  Platelet clumps noted and count appears adequate.  CBC with differential from 03/31/2022 showed absolute eosinophil count of 2.2.  INTERVAL HISTORY-  Patient seen today accompanied by his wife for follow-up. Patient denies fever, chills, nausea, vomiting, shortness of breath, cough, abdominal pain, bleeding, bowel or bladder issues. Energy level is good.  Appetite is good.   Denies pain.   REVIEW OF SYSTEMS:   Review of Systems  Constitutional:  Negative for chills, fever and weight loss.  Respiratory:  Negative for cough and shortness of breath.   Cardiovascular:  Negative for chest pain and leg swelling.  Gastrointestinal:  Negative for nausea and vomiting.  Neurological:  Negative for dizziness and headaches.   Psychiatric/Behavioral:  Positive for memory loss.     As per HPI. Otherwise, a complete review of systems is negative.  PAST MEDICAL HISTORY: Past Medical History:  Diagnosis Date   Acute hypoxemic respiratory failure due to COVID-19 ([place]) 05/13/2020   Alcohol abuse    Diverticulitis    Periumbilical hernia     PAST SURGICAL HISTORY: History reviewed. No pertinent surgical history.  FAMILY HISTORY: Family History  Problem Relation Age of Onset   Alcohol abuse Father    Stroke Father    Hypertension Father    Emphysema Father    Breast cancer Sister    Heart attack Brother    Aneurysm Sister    Stroke Brother    Hyperlipidemia Brother     HEALTH MAINTENANCE: Social History   Tobacco Use   Smoking status: Never   Smokeless tobacco: Never  Vaping Use   Vaping Use: Never used  Substance Use Topics   Alcohol use: Not Currently   Drug use: Never     No Known Allergies  Current Outpatient Medications  Medication Sig Dispense Refill   acetaminophen (TYLENOL) 500 MG tablet Take 500 mg by mouth every 6 (six) hours as needed.     atorvastatin (LIPITOR) 40 MG tablet TAKE 1 TABLET BY MOUTH ONCE DAILY FOR CHOLESTEROL 90 tablet 3   cyanocobalamin 1000 MCG tablet Take by mouth.     divalproex (DEPAKOTE) 500 MG DR tablet Take 500 mg by mouth 2 (two) times daily.     memantine (NAMENDA) 10 MG tablet Take 10 mg by mouth 2 (two) times daily.      Omega-3 1000 MG CAPS Take 1,000 mg by mouth every evening.  terbinafine (LAMISIL) 250 MG tablet Take 1 tablet (250 mg total) by mouth daily. 30 tablet 1   vitamin B-12 (CYANOCOBALAMIN) 1000 MCG tablet Take 1,000 mcg by mouth every evening.      No current facility-administered medications for this visit.    OBJECTIVE: Vitals:   08/26/22 1059  BP: 126/80  Pulse: 70  Resp: 20  Temp: (!) 96.9 F (36.1 C)  SpO2: 100%     Body mass index is 30.87 kg/m.      PE not performed since no new concerns raised.   LAB  RESULTS:  Lab Results  Component Value Date   NA 138 08/26/2022   K 3.6 08/26/2022   CL 102 08/26/2022   CO2 26 08/26/2022   GLUCOSE 103 (H) 08/26/2022   BUN 15 08/26/2022   CREATININE 1.12 08/26/2022   CALCIUM 8.5 (L) 08/26/2022   PROT 7.3 08/26/2022   ALBUMIN 3.4 (L) 08/26/2022   AST 25 08/26/2022   ALT 19 08/26/2022   ALKPHOS 51 08/26/2022   BILITOT 0.5 08/26/2022   GFRNONAA >60 08/26/2022   GFRAA >60 03/13/2014    Lab Results  Component Value Date   WBC 10.5 08/26/2022   NEUTROABS 3.7 08/26/2022   HGB 14.1 08/26/2022   HCT 42.9 08/26/2022   MCV 105.4 (H) 08/26/2022   PLT 183 08/26/2022    Lab Results  Component Value Date   TIBC 352.8 03/17/2022   FERRITIN 79.6 03/17/2022   FERRITIN 499 (H) 05/14/2020   IRONPCTSAT 33.2 03/17/2022     STUDIES: No results found.  ASSESSMENT AND PLAN:   Kurt Bailey is a 77 y.o. 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  #Macrocytosis without anemia  -Unknown etiology -Labs reviewed. MCV elevated dating back to 2014 ranging 100 to 105.  WBC, hemoglobin and platelets are within normal range. -From August 2023, Pathologist smear review showed macrocytosis, myeloid population consists predominantly of mature segmented neutrophils with reactive changes, absolute eosinophilia.  Platelet clumps noted and count appears adequate. -Denies any liver disease.  LFTs are normal.  Quit alcohol in 1985. -Medication list reviewed.  No offending agent identified. -Lab work done to assess for etiology of macrocytosis was unremarkable including TSH, B12, folate, reticulocyte count, hemolytic panel, copper.  -Labs were reviewed today.  Patient continues to have stable macrocytosis without anemia.  No indication for bone marrow biopsy.  Patient will continue to follow with his primary for annual CBC with  differential.  Patient can be referred back to hematology if there is worsening cytopenias or symptoms concerning for malignancy.  # Eosinophilia  -present since 12/2020 AEC 1.24. [place] 03/31/2022 showed AEC of 2.2.  [place] 05/2022 from 1.8 -Patient denies any allergy.  Denies any B symptoms. -overall count is stable. Continue to monitor.    RTC as needed  Patient expressed understanding and was in agreement with this plan. He also understands that He can call clinic at any time with any questions, concerns, or complaints.   I spent a total of 25 minutes reviewing chart data, face-to-face evaluation with the patient, counseling and coordination of care as detailed above.  Jane Canary, MD   08/26/2022 1:06 PM

## 2022-09-15 ENCOUNTER — Ambulatory Visit: Payer: Medicare Other | Admitting: Dermatology

## 2022-09-29 ENCOUNTER — Ambulatory Visit (INDEPENDENT_AMBULATORY_CARE_PROVIDER_SITE_OTHER): Payer: Medicare Other | Admitting: Dermatology

## 2022-09-29 VITALS — BP 121/67 | HR 64

## 2022-09-29 DIAGNOSIS — B351 Tinea unguium: Secondary | ICD-10-CM

## 2022-09-29 DIAGNOSIS — Z79899 Other long term (current) drug therapy: Secondary | ICD-10-CM

## 2022-09-29 MED ORDER — TERBINAFINE HCL 250 MG PO TABS: 250.0000 mg | ORAL_TABLET | Freq: Every day | ORAL | 0 refills | Status: DC

## 2022-09-29 NOTE — Patient Instructions (Signed)
Due to recent changes in healthcare laws, you may see results of your pathology and/or laboratory studies on MyChart before the doctors have had a chance to review them. We understand that in some cases there may be results that are confusing or concerning to you. Please understand that not all results are received at the same time and often the doctors may need to interpret multiple results in order to provide you with the best plan of care or course of treatment. Therefore, we ask that you please give us 2 business days to thoroughly review all your results before contacting the office for clarification. Should we see a critical lab result, you will be contacted sooner.   If You Need Anything After Your Visit  If you have any questions or concerns for your doctor, please call our main line at [phone] and press option 4 to reach your doctor's medical assistant. If no one answers, please leave a voicemail as directed and we will return your call as soon as possible. Messages left after 4 pm will be answered the following business day.   You may also send us a message via MyChart. We typically respond to MyChart messages within 1-2 business days.  For prescription refills, please ask your pharmacy to contact our office. Our fax number is [phone].  If you have an urgent issue when the clinic is closed that cannot wait until the next business day, you can page your doctor at the number below.    Please note that while we do our best to be available for urgent issues outside of office hours, we are not available 24/7.   If you have an urgent issue and are unable to reach us, you may choose to seek medical care at your doctor's office, retail clinic, urgent care center, or emergency room.  If you have a medical emergency, please immediately call 911 or go to the emergency department.  Pager Numbers  - Dr. Kowalski: [phone]  - Dr. Moye: [phone]  - Dr. Stewart:  [phone]  In the event of inclement weather, please call our main line at [phone] for an update on the status of any delays or closures.  Dermatology Medication Tips: Please keep the boxes that topical medications come in in order to help keep track of the instructions about where and how to use these. Pharmacies typically print the medication instructions only on the boxes and not directly on the medication tubes.   If your medication is too expensive, please contact our office at [phone] option 4 or send us a message through MyChart.   We are unable to tell what your co-pay for medications will be in advance as this is different depending on your insurance coverage. However, we may be able to find a substitute medication at lower cost or fill out paperwork to get insurance to cover a needed medication.   If a prior authorization is required to get your medication covered by your insurance company, please allow us 1-2 business days to complete this process.  Drug prices often vary depending on where the prescription is filled and some pharmacies may offer cheaper prices.  The website www.goodrx.com contains coupons for medications through different pharmacies. The prices here do not account for what the cost may be with help from insurance (it may be cheaper with your insurance), but the website can give you the price if you did not use any insurance.  - You can print the associated coupon and take it with   your prescription to the pharmacy.  - You may also stop by our office during regular business hours and pick up a GoodRx coupon card.  - If you need your prescription sent electronically to a different pharmacy, notify our office through [facility] MyChart or by phone at [phone] option 4.     Si Usted Necesita Algo Despus de Su Visita  Tambin puede enviarnos un mensaje a travs de MyChart. Por lo general respondemos a los mensajes de MyChart en el transcurso de 1 a 2  das hbiles.  Para renovar recetas, por favor pida a su farmacia que se ponga en contacto con nuestra oficina. Nuestro nmero de fax es el [phone].  Si tiene un asunto urgente cuando la clnica est cerrada y que no puede esperar hasta el siguiente da hbil, puede llamar/localizar a su doctor(a) al nmero que aparece a continuacin.   Por favor, tenga en cuenta que aunque hacemos todo lo posible para estar disponibles para asuntos urgentes fuera del horario de oficina, no estamos disponibles las 24 horas del da, los 7 das de la semana.   Si tiene un problema urgente y no puede comunicarse con nosotros, puede optar por buscar atencin mdica  en el consultorio de su doctor(a), en una clnica privada, en un centro de atencin urgente o en una sala de emergencias.  Si tiene una emergencia mdica, por favor llame inmediatamente al 911 o vaya a la sala de emergencias.  Nmeros de bper  - Dr. Kowalski: [phone]  - Dra. Moye: [phone]  - Dra. Stewart: [phone]  En caso de inclemencias del tiempo, por favor llame a nuestra lnea principal al [phone] para una actualizacin sobre el estado de cualquier retraso o cierre.  Consejos para la medicacin en dermatologa: Por favor, guarde las cajas en las que vienen los medicamentos de uso tpico para ayudarle a seguir las instrucciones sobre dnde y cmo usarlos. Las farmacias generalmente imprimen las instrucciones del medicamento slo en las cajas y no directamente en los tubos del medicamento.   Si su medicamento es muy caro, por favor, pngase en contacto con nuestra oficina llamando al [phone] y presione la opcin 4 o envenos un mensaje a travs de MyChart.   No podemos decirle cul ser su copago por los medicamentos por adelantado ya que esto es diferente dependiendo de la cobertura de su seguro. Sin embargo, es posible que podamos encontrar un medicamento sustituto a menor costo o llenar un formulario para que el  seguro cubra el medicamento que se considera necesario.   Si se requiere una autorizacin previa para que su compaa de seguros cubra su medicamento, por favor permtanos de 1 a 2 das hbiles para completar este proceso.  Los precios de los medicamentos varan con frecuencia dependiendo del lugar de dnde se surte la receta y alguna farmacias pueden ofrecer precios ms baratos.  El sitio web www.goodrx.com tiene cupones para medicamentos de diferentes farmacias. Los precios aqu no tienen en cuenta lo que podra costar con la ayuda del seguro (puede ser ms barato con su seguro), pero el sitio web puede darle el precio si no utiliz ningn seguro.  - Puede imprimir el cupn correspondiente y llevarlo con su receta a la farmacia.  - Tambin puede pasar por nuestra oficina durante el horario de atencin regular y recoger una tarjeta de cupones de GoodRx.  - Si necesita que su receta se enve electrnicamente a una farmacia diferente, informe a nuestra oficina a travs de MyChart de    o por telfono llamando al [phone] y presione la opcin 4.  

## 2022-09-29 NOTE — Progress Notes (Signed)
   Follow-Up Visit   Subjective  Kurt Bailey is a 77 y.o. male who presents for the following: Tinea Unguium/tinea cruris (Toenails, fingernails, R hip, Lamisil '250mg'$  x 27m.  Patient accompanied by wife.  The following portions of the chart were reviewed this encounter and updated as appropriate:   Tobacco  Allergies  Meds  Problems  Med Hx  Surg Hx  Fam Hx     Review of Systems:  No other skin or systemic complaints except as noted in HPI or Assessment and Plan.  Objective  Well appearing patient in no apparent distress; mood and affect are within normal limits.  A focused examination was performed including fingernails, toenails. Relevant physical exam findings are noted in the Assessment and Plan.  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 Condition is symptomatic / bothersome to patient. Not to goal.   Re-Start Lamisil '250mg'$  1 po qd for 1 more month for a total of 4 months  Terbinafine Counseling Terbinafine is an anti-fungal medicine that can be applied to the skin (over the counter) or taken by mouth (prescription) to treat fungal infections. The pill version is often used to treat fungal infections of the nails or scalp. While most people do not have any side effects from taking terbinafine pills, some possible side effects of the medicine can include taste changes, headache, loss of smell, vision changes, nausea, vomiting, or diarrhea.   Rare side effects can include irritation of the liver, allergic reaction, or decrease in blood counts (which may show up as not feeling well or developing an infection). If you are concerned about any of these side effects, please stop the medicine and call your doctor, or in the case of an emergency such as  feeling very unwell, seek immediate medical care.    Related Medications terbinafine (LAMISIL) 250 MG tablet Take 1 tablet (250 mg total) by mouth daily.  Return in about 1 year (around 09/30/2023) for tinea unguium .  I, SOthelia Pulling RMA, am acting as scribe for DSarina Ser MD . Documentation: I have reviewed the above documentation for accuracy and completeness, and I agree with the above.  DSarina Ser MD

## 2022-10-06 ENCOUNTER — Encounter: Payer: Self-pay | Admitting: Dermatology

## 2022-10-28 DIAGNOSIS — R2689 Other abnormalities of gait and mobility: Secondary | ICD-10-CM | POA: Diagnosis not present

## 2022-10-28 DIAGNOSIS — F01518 Vascular dementia, unspecified severity, with other behavioral disturbance: Secondary | ICD-10-CM | POA: Diagnosis not present

## 2022-10-28 DIAGNOSIS — G309 Alzheimer's disease, unspecified: Secondary | ICD-10-CM | POA: Diagnosis not present

## 2022-10-28 DIAGNOSIS — F02818 Dementia in other diseases classified elsewhere, unspecified severity, with other behavioral disturbance: Secondary | ICD-10-CM | POA: Diagnosis not present

## 2022-11-18 ENCOUNTER — Encounter: Payer: Self-pay | Admitting: Primary Care

## 2022-11-18 ENCOUNTER — Ambulatory Visit (INDEPENDENT_AMBULATORY_CARE_PROVIDER_SITE_OTHER): Payer: Medicare Other | Admitting: Primary Care

## 2022-11-18 VITALS — BP 128/72 | HR 65 | Temp 98.1°F | Ht 68.0 in | Wt 200.0 lb

## 2022-11-18 DIAGNOSIS — E538 Deficiency of other specified B group vitamins: Secondary | ICD-10-CM

## 2022-11-18 DIAGNOSIS — F02818 Dementia in other diseases classified elsewhere, unspecified severity, with other behavioral disturbance: Secondary | ICD-10-CM

## 2022-11-18 DIAGNOSIS — G309 Alzheimer's disease, unspecified: Secondary | ICD-10-CM

## 2022-11-18 DIAGNOSIS — F01518 Vascular dementia, unspecified severity, with other behavioral disturbance: Secondary | ICD-10-CM | POA: Diagnosis not present

## 2022-11-18 DIAGNOSIS — E785 Hyperlipidemia, unspecified: Secondary | ICD-10-CM

## 2022-11-18 DIAGNOSIS — R7303 Prediabetes: Secondary | ICD-10-CM

## 2022-11-18 DIAGNOSIS — D7589 Other specified diseases of blood and blood-forming organs: Secondary | ICD-10-CM | POA: Diagnosis not present

## 2022-11-18 DIAGNOSIS — R42 Dizziness and giddiness: Secondary | ICD-10-CM

## 2022-11-18 NOTE — Patient Instructions (Signed)
Set up a lab only appointment up front for August 2024.  Consider trying meclizine medication as needed for vertigo.   It was a pleasure to see you today!

## 2022-11-18 NOTE — Assessment & Plan Note (Signed)
Symptoms consistent with vertigo. Discussed use of Meclicine 12.5 mg PRN.  He and his wife decline further work up at this time but will notify if symptoms become bothersome/progress.

## 2022-11-18 NOTE — Assessment & Plan Note (Signed)
Repeat B12 level in August 2024, he declines today. Continue B12 1000 mcg daily

## 2022-11-18 NOTE — Assessment & Plan Note (Signed)
Repeat A1C in August 2024, he declines today.

## 2022-11-18 NOTE — Assessment & Plan Note (Signed)
Reviewed lipid panel from August 2023. Continue atorvastatin 40 mg daily.  Repeat lipids in August 2024

## 2022-11-18 NOTE — Progress Notes (Signed)
Subjective:    Patient ID: Kurt Bailey, male    DOB: 1946/04/26, 77 y.o.   MRN: [id number]  HPI  Kurt Bailey is a very pleasant 77 y.o. male with a history of mixed alzheimer's and vascular dementia, vitamin B12 deficiency, prediabetes, macrocytosis without anemia  who presents today for follow up of chronic conditions.  His wife joins [place] today who provides most of the information for HPI. He declines all vaccines.  1) Mixed Alzheimer's and vascular dementia: Following with neurology, and is managed on Depakote 500 mg BID, Namenda 10 mg BID. Last office visit was in March 2024, no changes made to medication regimen.   His wife comments slow progression of memory loss.   2) Hypertension/Murmur: Currently following with cardiology, last office visit was in November 2023, BP was 148/90, recommendations for weight loss through exercise and diet were provided.   He denies chest pain, shortness of breath. He does notice intermittent dizziness, room spinning sensation, occurs once weekly lasting a few seconds.   BP Readings from Last 3 Encounters:  11/18/22 128/72  09/29/22 121/67  08/26/22 126/80     3) Hyperlipidemia: Currently managed on atorvastatin 40 mg daily. Lipid panel from August 2023 with LDL of 58, HDL of 35.   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 Referred to and evaluated by hematology, last office visit was in January 2024, labs had remained stable and further work up was unremarkable. The decision was made to discontinue hematology visits for now.    Review of Systems  Respiratory:  Negative for shortness of breath.   Cardiovascular:  Negative for chest pain.  Gastrointestinal:  Negative for constipation and diarrhea.  Allergic/Immunologic: Positive for environmental allergies.  Neurological:  Positive for light-headedness. Negative for headaches.  Psychiatric/Behavioral:  The patient is not nervous/anxious.          Past Medical History:   Diagnosis Date   Acute hypoxemic respiratory failure due to COVID-19 05/13/2020   Alcohol abuse    Diverticulitis    Periumbilical hernia     Social History   Socioeconomic History   Marital status: Married    Spouse name: Not on file   Number of children: Not on file   Years of education: Not on file   Highest education level: Not on file  Occupational History   Not on file  Tobacco Use   Smoking status: Never   Smokeless tobacco: Never  Vaping Use   Vaping Use: Never used  Substance and Sexual Activity   Alcohol use: Not Currently   Drug use: Never   Sexual activity: Not on file  Other Topics Concern   Not on file  Social History Narrative   Married.   4 children.   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   Enjoys relaxing, walking.   Social Determinants of Health   Financial Resource Strain: Low Risk  (01/01/2022)   Overall Financial Resource Strain (CARDIA)    Difficulty of Paying Living Expenses: Not hard at all  Food Insecurity: No Food Insecurity (01/01/2022)   Hunger Vital Sign    Worried About Running Out of Food in the Last Year: Never true    Ran Out of Food in the Last Year: Never true  Transportation Needs: No Transportation Needs (01/01/2022)   PRAPARE - Administrator, Civil Service (Medical): No    Lack of Transportation (Non-Medical): No  Physical Activity: Inactive (01/01/2022)   Exercise Vital Sign    Days of Exercise  per Week: 0 days    Minutes of Exercise per Session: 0 min  Stress: Not on file  Social Connections: Not on file  Intimate Partner Violence: Not on file    History reviewed. No pertinent surgical history.  Family History  Problem Relation Age of Onset   Alcohol abuse Father    Stroke Father    Hypertension Father    Emphysema Father    Breast cancer Sister    Heart attack Brother    Aneurysm Sister    Stroke Brother    Hyperlipidemia Brother     No Known Allergies  Current Outpatient Medications on File Prior to  Visit  Medication Sig Dispense Refill   acetaminophen (TYLENOL) 500 MG tablet Take 500 mg by mouth every 6 (six) hours as needed.     atorvastatin (LIPITOR) 40 MG tablet TAKE 1 TABLET BY MOUTH ONCE DAILY FOR CHOLESTEROL 90 tablet 3   cyanocobalamin 1000 MCG tablet Take by mouth.     divalproex (DEPAKOTE) 500 MG DR tablet Take 500 mg by mouth 2 (two) times daily.     Omega-3 1000 MG CAPS Take 1,000 mg by mouth every evening.      vitamin B-12 (CYANOCOBALAMIN) 1000 MCG tablet Take 1,000 mcg by mouth every evening.      memantine (NAMENDA) 10 MG tablet Take 10 mg by mouth 2 (two) times daily.      No current facility-administered medications on file prior to visit.    BP 128/72   Pulse 65   Temp 98.1 F (36.7 C) (Temporal)   Ht 5\' 8"  (1.727 m)   Wt 200 lb (90.7 kg)   SpO2 94%   BMI 30.41 kg/m  Objective:   Physical Exam HENT:     Right Ear: Tympanic membrane and ear canal normal.     Left Ear: Tympanic membrane and ear canal normal.  Cardiovascular:     Rate and Rhythm: Normal rate and regular rhythm.  Pulmonary:     Effort: Pulmonary effort is normal.     Breath sounds: Normal breath sounds. No wheezing or rales.  Abdominal:     General: Bowel sounds are normal.     Palpations: Abdomen is soft.     Tenderness: There is no abdominal tenderness.  Musculoskeletal:     Cervical back: Neck supple.  Skin:    General: Skin is warm and dry.  Neurological:     Mental Status: He is alert.     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  Continue Depakote 500 mg BID and Namenda 10 mg BID.    Hyperlipidemia, unspecified hyperlipidemia type Assessment & Plan: Reviewed lipid panel from August 2023. Continue atorvastatin 40 mg daily.  Repeat lipids in August 2024  Orders: -     Lipid panel; Future -      Basic metabolic panel; Future  Macrocytosis without anemia Assessment & Plan: Benign. Reviewed hematology notes from January 2024, no further monitoring needed.    Pre-diabetes Assessment & Plan: Repeat A1C in August 2024, he declines today.  Orders: -     Hemoglobin A1c; Future  Vitamin B 12 deficiency Assessment & Plan: Repeat B12 level in August 2024, he declines today. Continue B12 1000 mcg daily  Orders: -     Vitamin B12; Future  Vertigo Assessment &  Plan: Symptoms consistent with vertigo. Discussed use of Meclicine 12.5 mg PRN.  He and his wife decline further work up at this time but will notify if symptoms become bothersome/progress.         Doreene Nest, NP

## 2022-11-18 NOTE — Assessment & Plan Note (Signed)
Benign. Reviewed hematology notes from January 2024, no further monitoring needed.

## 2022-11-18 NOTE — Assessment & Plan Note (Signed)
Following with neurology, office notes reviewed from March 2024.  Continue Depakote 500 mg BID and Namenda 10 mg BID.

## 2022-12-29 DIAGNOSIS — Z6832 Body mass index (BMI) 32.0-32.9, adult: Secondary | ICD-10-CM | POA: Diagnosis not present

## 2022-12-29 DIAGNOSIS — G309 Alzheimer's disease, unspecified: Secondary | ICD-10-CM | POA: Diagnosis not present

## 2022-12-29 DIAGNOSIS — F02818 Dementia in other diseases classified elsewhere, unspecified severity, with other behavioral disturbance: Secondary | ICD-10-CM | POA: Diagnosis not present

## 2022-12-29 DIAGNOSIS — E782 Mixed hyperlipidemia: Secondary | ICD-10-CM | POA: Diagnosis not present

## 2022-12-29 DIAGNOSIS — E6609 Other obesity due to excess calories: Secondary | ICD-10-CM | POA: Diagnosis not present

## 2022-12-29 DIAGNOSIS — I10 Essential (primary) hypertension: Secondary | ICD-10-CM | POA: Diagnosis not present

## 2022-12-29 DIAGNOSIS — F01518 Vascular dementia, unspecified severity, with other behavioral disturbance: Secondary | ICD-10-CM | POA: Diagnosis not present

## 2023-03-21 ENCOUNTER — Other Ambulatory Visit: Payer: Medicare Other

## 2023-03-26 ENCOUNTER — Other Ambulatory Visit: Payer: Self-pay | Admitting: Primary Care

## 2023-03-26 DIAGNOSIS — E785 Hyperlipidemia, unspecified: Secondary | ICD-10-CM

## 2023-03-30 ENCOUNTER — Other Ambulatory Visit (INDEPENDENT_AMBULATORY_CARE_PROVIDER_SITE_OTHER): Payer: Medicare Other

## 2023-03-30 DIAGNOSIS — E785 Hyperlipidemia, unspecified: Secondary | ICD-10-CM | POA: Diagnosis not present

## 2023-03-30 DIAGNOSIS — R7303 Prediabetes: Secondary | ICD-10-CM

## 2023-03-30 DIAGNOSIS — E538 Deficiency of other specified B group vitamins: Secondary | ICD-10-CM | POA: Diagnosis not present

## 2023-03-30 LAB — BASIC METABOLIC PANEL
BUN: 11 mg/dL (ref 6–23)
CO2: 31 mEq/L (ref 19–32)
Calcium: 9.1 mg/dL (ref 8.4–10.5)
Chloride: 101 mEq/L (ref 96–112)
Creatinine, Ser: 1.06 mg/dL (ref 0.40–1.50)
GFR: 68.44 mL/min (ref >60.00)
Glucose, Bld: 89 mg/dL (ref 70–99)
Potassium: 4.1 mEq/L (ref 3.5–5.1)
Sodium: 142 mEq/L (ref 135–145)

## 2023-03-30 LAB — LIPID PANEL
Cholesterol: 110 mg/dL (ref 0–200)
HDL: 29.9 mg/dL — ABNORMAL LOW (ref >39.00)
LDL Cholesterol: 53 mg/dL (ref 0–99)
NonHDL: 80.37
Total CHOL/HDL Ratio: 4
Triglycerides: 137 mg/dL (ref 0.0–149.0)
VLDL: 27.4 mg/dL (ref 0.0–40.0)

## 2023-03-30 LAB — VITAMIN B12: Vitamin B-12: 1176 pg/mL — ABNORMAL HIGH (ref 211–911)

## 2023-03-30 LAB — HEMOGLOBIN A1C: Hgb A1c MFr Bld: 5.9 % (ref 4.6–6.5)

## 2023-03-31 ENCOUNTER — Telehealth: Payer: Self-pay | Admitting: Primary Care

## 2023-03-31 NOTE — Telephone Encounter (Signed)
Patient returned call regarding labs.I relayed the good news to him,he understood and okayed everrything

## 2023-03-31 NOTE — Telephone Encounter (Signed)
See result note for further documentation.

## 2023-04-01 ENCOUNTER — Other Ambulatory Visit: Payer: Medicare Other

## 2023-04-26 ENCOUNTER — Other Ambulatory Visit: Payer: Self-pay | Admitting: Student

## 2023-04-26 DIAGNOSIS — M542 Cervicalgia: Secondary | ICD-10-CM

## 2023-04-26 DIAGNOSIS — R2689 Other abnormalities of gait and mobility: Secondary | ICD-10-CM

## 2023-04-26 DIAGNOSIS — G309 Alzheimer's disease, unspecified: Secondary | ICD-10-CM | POA: Diagnosis not present

## 2023-04-26 DIAGNOSIS — F02818 Dementia in other diseases classified elsewhere, unspecified severity, with other behavioral disturbance: Secondary | ICD-10-CM | POA: Diagnosis not present

## 2023-04-26 DIAGNOSIS — F01518 Vascular dementia, unspecified severity, with other behavioral disturbance: Secondary | ICD-10-CM | POA: Diagnosis not present

## 2023-04-27 ENCOUNTER — Ambulatory Visit
Admission: RE | Admit: 2023-04-27 | Discharge: 2023-04-27 | Disposition: A | Discharge status: Home / Self Care | Payer: Medicare Other | Source: Ambulatory Visit | Attending: Student | Admitting: Student

## 2023-04-27 ENCOUNTER — Inpatient Hospital Stay: Admission: RE | Admit: 2023-04-27 | Payer: Medicare Other | Source: Ambulatory Visit

## 2023-04-27 DIAGNOSIS — G309 Alzheimer's disease, unspecified: Secondary | ICD-10-CM

## 2023-04-27 DIAGNOSIS — R2689 Other abnormalities of gait and mobility: Secondary | ICD-10-CM

## 2023-04-27 DIAGNOSIS — M542 Cervicalgia: Secondary | ICD-10-CM | POA: Diagnosis not present

## 2023-04-27 DIAGNOSIS — F02818 Dementia in other diseases classified elsewhere, unspecified severity, with other behavioral disturbance: Secondary | ICD-10-CM

## 2023-04-27 DIAGNOSIS — I6782 Cerebral ischemia: Secondary | ICD-10-CM | POA: Diagnosis not present

## 2023-05-10 DIAGNOSIS — B351 Tinea unguium: Secondary | ICD-10-CM | POA: Diagnosis not present

## 2023-05-10 DIAGNOSIS — M79675 Pain in left toe(s): Secondary | ICD-10-CM | POA: Diagnosis not present

## 2023-05-10 DIAGNOSIS — L600 Ingrowing nail: Secondary | ICD-10-CM | POA: Diagnosis not present

## 2023-05-10 DIAGNOSIS — M79674 Pain in right toe(s): Secondary | ICD-10-CM | POA: Diagnosis not present

## 2023-05-25 DIAGNOSIS — R2689 Other abnormalities of gait and mobility: Secondary | ICD-10-CM | POA: Diagnosis not present

## 2023-05-25 DIAGNOSIS — F02818 Dementia in other diseases classified elsewhere, unspecified severity, with other behavioral disturbance: Secondary | ICD-10-CM | POA: Diagnosis not present

## 2023-05-25 DIAGNOSIS — G309 Alzheimer's disease, unspecified: Secondary | ICD-10-CM | POA: Diagnosis not present

## 2023-05-25 DIAGNOSIS — F01518 Vascular dementia, unspecified severity, with other behavioral disturbance: Secondary | ICD-10-CM | POA: Diagnosis not present

## 2023-05-25 DIAGNOSIS — I639 Cerebral infarction, unspecified: Secondary | ICD-10-CM | POA: Diagnosis not present

## 2023-05-25 DIAGNOSIS — M542 Cervicalgia: Secondary | ICD-10-CM | POA: Diagnosis not present

## 2023-06-02 ENCOUNTER — Other Ambulatory Visit: Payer: Self-pay | Admitting: Neurology

## 2023-06-02 DIAGNOSIS — F02818 Dementia in other diseases classified elsewhere, unspecified severity, with other behavioral disturbance: Secondary | ICD-10-CM

## 2023-06-06 ENCOUNTER — Ambulatory Visit
Admission: RE | Admit: 2023-06-06 | Discharge: 2023-06-06 | Disposition: A | Discharge status: Home / Self Care | Payer: Medicare Other | Source: Ambulatory Visit | Attending: Neurology | Admitting: Neurology

## 2023-06-06 DIAGNOSIS — F01518 Vascular dementia, unspecified severity, with other behavioral disturbance: Secondary | ICD-10-CM | POA: Diagnosis not present

## 2023-06-06 DIAGNOSIS — F02818 Dementia in other diseases classified elsewhere, unspecified severity, with other behavioral disturbance: Secondary | ICD-10-CM | POA: Diagnosis not present

## 2023-06-06 DIAGNOSIS — G309 Alzheimer's disease, unspecified: Secondary | ICD-10-CM | POA: Insufficient documentation

## 2023-06-06 DIAGNOSIS — F0390 Unspecified dementia without behavioral disturbance: Secondary | ICD-10-CM | POA: Diagnosis not present

## 2023-06-06 DIAGNOSIS — I6782 Cerebral ischemia: Secondary | ICD-10-CM | POA: Diagnosis not present

## 2023-06-06 DIAGNOSIS — R413 Other amnesia: Secondary | ICD-10-CM | POA: Diagnosis not present

## 2023-06-07 DIAGNOSIS — F01518 Vascular dementia, unspecified severity, with other behavioral disturbance: Secondary | ICD-10-CM | POA: Diagnosis not present

## 2023-06-07 DIAGNOSIS — F02818 Dementia in other diseases classified elsewhere, unspecified severity, with other behavioral disturbance: Secondary | ICD-10-CM | POA: Diagnosis not present

## 2023-06-07 DIAGNOSIS — E782 Mixed hyperlipidemia: Secondary | ICD-10-CM | POA: Diagnosis not present

## 2023-06-07 DIAGNOSIS — G309 Alzheimer's disease, unspecified: Secondary | ICD-10-CM | POA: Diagnosis not present

## 2023-06-07 DIAGNOSIS — Z6832 Body mass index (BMI) 32.0-32.9, adult: Secondary | ICD-10-CM | POA: Diagnosis not present

## 2023-06-07 DIAGNOSIS — E66811 Obesity, class 1: Secondary | ICD-10-CM | POA: Diagnosis not present

## 2023-06-07 DIAGNOSIS — R011 Cardiac murmur, unspecified: Secondary | ICD-10-CM | POA: Diagnosis not present

## 2023-06-07 DIAGNOSIS — I10 Essential (primary) hypertension: Secondary | ICD-10-CM | POA: Diagnosis not present

## 2023-06-07 DIAGNOSIS — E6609 Other obesity due to excess calories: Secondary | ICD-10-CM | POA: Diagnosis not present

## 2023-06-08 DIAGNOSIS — F01518 Vascular dementia, unspecified severity, with other behavioral disturbance: Secondary | ICD-10-CM | POA: Diagnosis not present

## 2023-06-08 DIAGNOSIS — I639 Cerebral infarction, unspecified: Secondary | ICD-10-CM | POA: Diagnosis not present

## 2023-06-08 DIAGNOSIS — G309 Alzheimer's disease, unspecified: Secondary | ICD-10-CM | POA: Diagnosis not present

## 2023-06-08 DIAGNOSIS — F02818 Dementia in other diseases classified elsewhere, unspecified severity, with other behavioral disturbance: Secondary | ICD-10-CM | POA: Diagnosis not present

## 2023-09-07 ENCOUNTER — Ambulatory Visit: Payer: Medicare Other

## 2023-09-07 VITALS — BP 118/70 | Ht 68.0 in | Wt 193.0 lb

## 2023-09-07 DIAGNOSIS — Z0000 Encounter for general adult medical examination without abnormal findings: Secondary | ICD-10-CM

## 2023-09-07 NOTE — Patient Instructions (Addendum)
 Mr. Bromwell , Thank you for taking time to come for your Medicare Wellness Visit. I appreciate your ongoing commitment to your health goals. Please review the following plan we discussed and let me know if I can assist you in the future.   Referrals/Orders/Follow-Ups/Clinician Recommendations:   This is a list of the screening recommended for you and due dates:  Health Maintenance  Topic Date Due   Zoster (Shingles) Vaccine (1 of 2) Never done   Pneumonia Vaccine (1 of 1 - PCV) Never done   Flu Shot  Never done   Medicare Annual Wellness Visit  09/06/2024   Hepatitis C Screening  Completed   HPV Vaccine  Aged Out   DTaP/Tdap/Td vaccine  Discontinued   COVID-19 Vaccine  Discontinued    Advanced directives: (Declined) Advance directive discussed with you today. Even though you declined this today, please call our office should you change your mind, and we can give you the proper paperwork for you to fill out.  Next Medicare Annual Wellness Visit scheduled for next year: Yes 09/07/2024 @ 11:30am in person

## 2023-09-07 NOTE — Progress Notes (Signed)
 Subjective:   Kurt Bailey is a 78 y.o. male who presents for Medicare Annual/Subsequent preventive examination.  Visit Complete: In person  Patient Medicare AWV questionnaire was completed by the patient on (not done); I have confirmed that all information answered by patient is correct and no changes since this date.  Cardiac Risk Factors include: advanced age (>39men, >25 women);dyslipidemia;male gender;sedentary lifestyle    Objective:    Today's Vitals   09/07/23 1040  BP: 118/70  Weight: 193 lb (87.5 kg)  Height: 5' 8 (1.727 m)   Body mass index is 29.35 kg/m.     09/07/2023   10:56 AM 08/26/2022   11:00 AM 05/17/2022   10:43 AM 05/03/2022   11:11 AM 01/01/2022    3:57 PM 05/14/2020    3:00 AM  Advanced Directives  Does Patient Have a Medical Advance Directive? No No No No No No  Would patient like information on creating a medical advance directive?  No - Patient declined  No - Patient declined  No - Patient declined    Current Medications (verified) Outpatient Encounter Medications as of 09/07/2023  Medication Sig   acetaminophen  (TYLENOL ) 500 MG tablet Take 500 mg by mouth every 6 (six) hours as needed.   atorvastatin  (LIPITOR) 40 MG tablet TAKE 1 TABLET BY MOUTH ONCE DAILY FOR CHOLESTEROL   cyanocobalamin  1000 MCG tablet Take by mouth.   divalproex  (DEPAKOTE ) 500 MG DR tablet Take 500 mg by mouth 2 (two) times daily.   Omega-3 1000 MG CAPS Take 1,000 mg by mouth every evening.    vitamin B-12 (CYANOCOBALAMIN ) 1000 MCG tablet Take 1,000 mcg by mouth every evening.    memantine  (NAMENDA ) 10 MG tablet Take 10 mg by mouth 2 (two) times daily.    No facility-administered encounter medications on file as of 09/07/2023.    Allergies (verified) Patient has no known allergies.   History: Past Medical History:  Diagnosis Date   Acute hypoxemic respiratory failure due to COVID-19 (HCC) 05/13/2020   Alcohol abuse    Diverticulitis    Periumbilical hernia    No  past surgical history on file. Family History  Problem Relation Age of Onset   Alcohol abuse Father    Stroke Father    Hypertension Father    Emphysema Father    Breast cancer Sister    Heart attack Brother    Aneurysm Sister    Stroke Brother    Hyperlipidemia Brother    Social History   Socioeconomic History   Marital status: Married    Spouse name: Not on file   Number of children: Not on file   Years of education: Not on file   Highest education level: Not on file  Occupational History   Not on file  Tobacco Use   Smoking status: Never   Smokeless tobacco: Never  Vaping Use   Vaping status: Never Used  Substance and Sexual Activity   Alcohol use: Not Currently   Drug use: Never   Sexual activity: Not on file  Other Topics Concern   Not on file  Social History Narrative   Married.   4 children.   Retired, once worked in market researcher.   Enjoys relaxing, walking.   Social Drivers of Corporate Investment Banker Strain: Low Risk  (09/07/2023)   Overall Financial Resource Strain (CARDIA)    Difficulty of Paying Living Expenses: Not hard at all  Food Insecurity: No Food Insecurity (09/07/2023)   Hunger Vital Sign  Worried About Programme Researcher, Broadcasting/film/video in the Last Year: Never true    Ran Out of Food in the Last Year: Never true  Transportation Needs: No Transportation Needs (09/07/2023)   PRAPARE - Administrator, Civil Service (Medical): No    Lack of Transportation (Non-Medical): No  Physical Activity: Inactive (09/07/2023)   Exercise Vital Sign    Days of Exercise per Week: 0 days    Minutes of Exercise per Session: 0 min  Stress: No Stress Concern Present (09/07/2023)   [employer] of Occupational Health - Occupational Stress Questionnaire    Feeling of Stress : Not at all  Social Connections: Socially Isolated (09/07/2023)   Social Connection and Isolation Panel [NHANES]    Frequency of Communication with Friends and Family: Never    Frequency of Social  Gatherings with Friends and Family: Never    Attends Religious Services: Never    Database Administrator or Organizations: No    Attends Engineer, Structural: Never    Marital Status: Married    Tobacco Counseling Counseling given: Not Answered  Clinical Intake:  Pre-visit preparation completed: Yes  Pain : No/denies pain   BMI - recorded: 29.35 Nutritional Status: BMI 25 -29 Overweight Nutritional Risks: None Diabetes: No  How often do you need to have someone help you when you read instructions, pamphlets, or other written materials from your doctor or pharmacy?: 1 - Never  Interpreter Needed?: No  Comments: lives with wife Information entered by :: B.Janeisha Ryle,LPN   Activities of Daily Living    09/07/2023   10:57 AM  In your present state of health, do you have any difficulty performing the following activities:  Hearing? 1  Vision? 0  Difficulty concentrating or making decisions? 1  Walking or climbing stairs? 1  Dressing or bathing? 0  Doing errands, shopping? 1  Preparing Food and eating ? N  Using the Toilet? N  In the past six months, have you accidently leaked urine? N  Do you have problems with loss of bowel control? N  Managing your Medications? N  Managing your Finances? N  Housekeeping or managing your Housekeeping? N    Patient Care Team: Gretta Comer POUR, NP as PCP - General (Internal Medicine)  Indicate any recent Medical Services you may have received from other than [facility] providers in the past year (date may be approximate).     Assessment:   This is a routine wellness examination for [place].  Hearing/Vision screen Hearing Screening - Comments:: Pt says his hearing is good Vision Screening - Comments:: Pt says his vision is good   Goals Addressed             This Visit's Progress    Patient Stated   On track    01/01/2022, no goals, stay alive       Depression Screen    09/07/2023   10:52 AM 03/16/2022   11:04 AM  01/01/2022    3:59 PM  PHQ 2/9 Scores  PHQ - 2 Score 0 0 0  PHQ- 9 Score  4     Fall Risk    09/07/2023   10:48 AM 11/18/2022   10:53 AM 03/16/2022   11:05 AM 01/01/2022    3:58 PM  Fall Risk   Falls in the past year? 1 0 0 1  Comment fell outside   tripped in the yard  Number falls in past yr: 1 0 0 1  Injury with Fall?  0 0 0 0  Risk for fall due to : Impaired balance/gait No Fall Risks  Medication side effect  Follow up Education provided;Falls prevention discussed Falls evaluation completed  Falls evaluation completed;Education provided;Falls prevention discussed    MEDICARE RISK AT HOME: Medicare Risk at Home Any stairs in or around the home?: Yes If so, are there any without handrails?: Yes Home free of loose throw rugs in walkways, pet beds, electrical cords, etc?: Yes Adequate lighting in your home to reduce risk of falls?: Yes Life alert?: No Use of a cane, walker or w/c?: No Grab bars in the bathroom?: Yes Shower chair or bench in shower?: No Elevated toilet seat or a handicapped toilet?: No  TIMED UP AND GO:  Was the test performed?  No   pt already standing up and walking toward nurse  Cognitive Function: (fail to load in note)    Appointment from 09/07/2023 in [facility] at [facility] 09/07/2023  1057   6 CIT Score  What year is it? Incorrect What month is it? Incorrect Give patient an address phrase to remember (5 components) [address] [place] About what time is it? Correct Count backwards from 20 to 1 More than 1 error Say the months of the year in reverse More than 1 error Repeat the address phrase from earlier All wrong  6 CIT Score 25 points   Immunizations  There is no immunization history on file for this patient.  TDAP status: Due, Education has been provided regarding the importance of this vaccine. Advised may receive this vaccine at local pharmacy or Health Dept. Aware to provide a copy of the vaccination  record if obtained from local pharmacy or Health Dept. Verbalized acceptance and understanding.  Flu Vaccine status: Declined, Education has been provided regarding the importance of this vaccine but patient still declined. Advised may receive this vaccine at local pharmacy or Health Dept. Aware to provide a copy of the vaccination record if obtained from local pharmacy or Health Dept. Verbalized acceptance and understanding.  Pneumococcal vaccine status: Declined,  Education has been provided regarding the importance of this vaccine but patient still declined. Advised may receive this vaccine at local pharmacy or Health Dept. Aware to provide a copy of the vaccination record if obtained from local pharmacy or Health Dept. Verbalized acceptance and understanding.   Covid-19 vaccine status: Declined, Education has been provided regarding the importance of this vaccine but patient still declined. Advised may receive this vaccine at local pharmacy or Health Dept.or vaccine clinic. Aware to provide a copy of the vaccination record if obtained from local pharmacy or Health Dept. Verbalized acceptance and understanding.  Qualifies for Shingles Vaccine? Yes   Zostavax completed No   Shingrix Completed?: No.    Education has been provided regarding the importance of this vaccine. Patient has been advised to call insurance company to determine out of pocket expense if they have not yet received this vaccine. Advised may also receive vaccine at local pharmacy or Health Dept. Verbalized acceptance and understanding.  Screening Tests Health Maintenance  Topic Date Due   Zoster Vaccines- Shingrix (1 of 2) Never done   Pneumonia Vaccine 23+ Years old (1 of 1 - PCV) Never done   INFLUENZA VACCINE  Never done   Medicare Annual Wellness (AWV)  09/06/2024   Hepatitis C Screening  Completed   HPV VACCINES  Aged Out   DTaP/Tdap/Td  Discontinued   COVID-19 Vaccine  Discontinued  Health Maintenance  Health  Maintenance Due  Topic Date Due   Zoster Vaccines- Shingrix (1 of 2) Never done   Pneumonia Vaccine 60+ Years old (1 of 1 - PCV) Never done   INFLUENZA VACCINE  Never done    Colorectal cancer screening: No longer required.   Lung Cancer Screening: (Low Dose CT Chest recommended if Age 7-80 years, 20 pack-year currently smoking OR have quit w/in 15years.) does not qualify.   Lung Cancer Screening Referral: no  Additional Screening:  Hepatitis C Screening: does not qualify; Completed 09/19/2018  Vision Screening: Recommended annual ophthalmology exams for early detection of glaucoma and other disorders of the eye. Is the patient up to date with their annual eye exam?  Yes  Who is the provider or what is the name of the office in which the patient attends annual eye exams? none If pt is not established with a provider, would they like to be referred to a provider to establish care? No .   Dental Screening: Recommended annual dental exams for proper oral hygiene  Diabetic Foot Exam: n/a  Community Resource Referral / Chronic Care Management: CRR required this visit?  No   CCM required this visit?  Appt scheduled with PCP for PE  *Pt needs CCM referral as he has dementia and needs more support with transportation, management of disease process,     Plan:     I have personally reviewed and noted the following in the patient's chart:   Medical and social history Use of alcohol, tobacco or illicit drugs  Current medications and supplements including opioid prescriptions. Patient is not currently taking opioid prescriptions. Functional ability and status Nutritional status Physical activity Advanced directives List of other physicians Hospitalizations, surgeries, and ER visits in previous 12 months Vitals Screenings to include cognitive, depression, and falls Referrals and appointments  In addition, I have reviewed and discussed with patient certain preventive protocols,  quality metrics, and best practice recommendations. A written personalized care plan for preventive services as well as general preventive health recommendations were provided to patient.    Erminio LITTIE Saris, LPN   02/02/7973   After Visit Summary: (In Person-Declined) Patient declined AVS at this time.  Nurse Notes: Pt presents with his wife. He talks about having dizzy spells at varied times which were culprit for two falls this month. Pt fell in his yard and states no injuries. Pt's wife says when they have to get out and have no ride the pt drives, which she says is scary. Pt failed all of  6CIT questions. Wife says there is a support group but he is unable to get to it. Pt /wife have no concerns or questions at this time.    Patient declined referral to establish with an ophthalmologist.   Vaccinations: declines all Influenza vaccine: recommend every Fall Pneumococcal vaccine: recommend once per lifetime Prevnar-20 Tdap vaccine: recommend every 10 years Shingles vaccine: recommend Shingrix which is 2 doses 2-6 months apart and over 90% effective     Covid-19: recommend 2 doses one month apart with a booster 6 months later

## 2023-09-09 ENCOUNTER — Emergency Department: Payer: Medicare Other

## 2023-09-09 ENCOUNTER — Inpatient Hospital Stay
Admission: EM | Admit: 2023-09-09 | Discharge: 2023-09-19 | DRG: 178 | Disposition: A | Discharge status: Skilled Nursing Facility | Payer: Medicare Other | Attending: Internal Medicine | Admitting: Internal Medicine

## 2023-09-09 ENCOUNTER — Telehealth: Payer: Self-pay | Admitting: Primary Care

## 2023-09-09 ENCOUNTER — Other Ambulatory Visit: Payer: Self-pay

## 2023-09-09 DIAGNOSIS — Z823 Family history of stroke: Secondary | ICD-10-CM

## 2023-09-09 DIAGNOSIS — Z8673 Personal history of transient ischemic attack (TIA), and cerebral infarction without residual deficits: Secondary | ICD-10-CM

## 2023-09-09 DIAGNOSIS — E663 Overweight: Secondary | ICD-10-CM | POA: Diagnosis not present

## 2023-09-09 DIAGNOSIS — R278 Other lack of coordination: Secondary | ICD-10-CM | POA: Diagnosis not present

## 2023-09-09 DIAGNOSIS — R296 Repeated falls: Secondary | ICD-10-CM | POA: Diagnosis present

## 2023-09-09 DIAGNOSIS — E860 Dehydration: Secondary | ICD-10-CM | POA: Diagnosis present

## 2023-09-09 DIAGNOSIS — Z8616 Personal history of COVID-19: Secondary | ICD-10-CM | POA: Diagnosis present

## 2023-09-09 DIAGNOSIS — Z28310 Unvaccinated for covid-19: Secondary | ICD-10-CM

## 2023-09-09 DIAGNOSIS — D696 Thrombocytopenia, unspecified: Secondary | ICD-10-CM | POA: Insufficient documentation

## 2023-09-09 DIAGNOSIS — D519 Vitamin B12 deficiency anemia, unspecified: Secondary | ICD-10-CM | POA: Diagnosis present

## 2023-09-09 DIAGNOSIS — G309 Alzheimer's disease, unspecified: Secondary | ICD-10-CM | POA: Diagnosis not present

## 2023-09-09 DIAGNOSIS — G4709 Other insomnia: Secondary | ICD-10-CM | POA: Diagnosis not present

## 2023-09-09 DIAGNOSIS — Z79899 Other long term (current) drug therapy: Secondary | ICD-10-CM

## 2023-09-09 DIAGNOSIS — Z743 Need for continuous supervision: Secondary | ICD-10-CM | POA: Diagnosis not present

## 2023-09-09 DIAGNOSIS — E538 Deficiency of other specified B group vitamins: Secondary | ICD-10-CM | POA: Insufficient documentation

## 2023-09-09 DIAGNOSIS — Z6829 Body mass index (BMI) 29.0-29.9, adult: Secondary | ICD-10-CM

## 2023-09-09 DIAGNOSIS — Z8249 Family history of ischemic heart disease and other diseases of the circulatory system: Secondary | ICD-10-CM

## 2023-09-09 DIAGNOSIS — R29818 Other symptoms and signs involving the nervous system: Secondary | ICD-10-CM | POA: Diagnosis not present

## 2023-09-09 DIAGNOSIS — E785 Hyperlipidemia, unspecified: Secondary | ICD-10-CM | POA: Diagnosis not present

## 2023-09-09 DIAGNOSIS — G4700 Insomnia, unspecified: Secondary | ICD-10-CM | POA: Insufficient documentation

## 2023-09-09 DIAGNOSIS — R2689 Other abnormalities of gait and mobility: Secondary | ICD-10-CM | POA: Diagnosis not present

## 2023-09-09 DIAGNOSIS — R531 Weakness: Secondary | ICD-10-CM | POA: Diagnosis not present

## 2023-09-09 DIAGNOSIS — R41841 Cognitive communication deficit: Secondary | ICD-10-CM | POA: Diagnosis not present

## 2023-09-09 DIAGNOSIS — Z825 Family history of asthma and other chronic lower respiratory diseases: Secondary | ICD-10-CM

## 2023-09-09 DIAGNOSIS — Z550 Illiteracy and low-level literacy: Secondary | ICD-10-CM

## 2023-09-09 DIAGNOSIS — R5381 Other malaise: Secondary | ICD-10-CM | POA: Diagnosis present

## 2023-09-09 DIAGNOSIS — F02818 Dementia in other diseases classified elsewhere, unspecified severity, with other behavioral disturbance: Secondary | ICD-10-CM | POA: Diagnosis present

## 2023-09-09 DIAGNOSIS — Z23 Encounter for immunization: Secondary | ICD-10-CM

## 2023-09-09 DIAGNOSIS — I10 Essential (primary) hypertension: Secondary | ICD-10-CM | POA: Diagnosis not present

## 2023-09-09 DIAGNOSIS — R0902 Hypoxemia: Secondary | ICD-10-CM | POA: Diagnosis not present

## 2023-09-09 DIAGNOSIS — D539 Nutritional anemia, unspecified: Secondary | ICD-10-CM | POA: Diagnosis present

## 2023-09-09 DIAGNOSIS — Z803 Family history of malignant neoplasm of breast: Secondary | ICD-10-CM | POA: Diagnosis not present

## 2023-09-09 DIAGNOSIS — K567 Ileus, unspecified: Secondary | ICD-10-CM | POA: Diagnosis present

## 2023-09-09 DIAGNOSIS — Z83438 Family history of other disorder of lipoprotein metabolism and other lipidemia: Secondary | ICD-10-CM | POA: Diagnosis not present

## 2023-09-09 DIAGNOSIS — Z811 Family history of alcohol abuse and dependence: Secondary | ICD-10-CM

## 2023-09-09 DIAGNOSIS — S0990XA Unspecified injury of head, initial encounter: Secondary | ICD-10-CM | POA: Diagnosis not present

## 2023-09-09 DIAGNOSIS — R338 Other retention of urine: Secondary | ICD-10-CM | POA: Diagnosis not present

## 2023-09-09 DIAGNOSIS — W19XXXA Unspecified fall, initial encounter: Secondary | ICD-10-CM | POA: Diagnosis not present

## 2023-09-09 DIAGNOSIS — R488 Other symbolic dysfunctions: Secondary | ICD-10-CM | POA: Diagnosis not present

## 2023-09-09 DIAGNOSIS — S199XXA Unspecified injury of neck, initial encounter: Secondary | ICD-10-CM | POA: Diagnosis not present

## 2023-09-09 DIAGNOSIS — U071 COVID-19: Secondary | ICD-10-CM | POA: Diagnosis not present

## 2023-09-09 DIAGNOSIS — R140 Abdominal distension (gaseous): Secondary | ICD-10-CM | POA: Diagnosis not present

## 2023-09-09 DIAGNOSIS — E876 Hypokalemia: Secondary | ICD-10-CM | POA: Diagnosis present

## 2023-09-09 DIAGNOSIS — F01518 Vascular dementia, unspecified severity, with other behavioral disturbance: Secondary | ICD-10-CM | POA: Diagnosis present

## 2023-09-09 DIAGNOSIS — R062 Wheezing: Secondary | ICD-10-CM | POA: Diagnosis not present

## 2023-09-09 DIAGNOSIS — E878 Other disorders of electrolyte and fluid balance, not elsewhere classified: Secondary | ICD-10-CM | POA: Diagnosis present

## 2023-09-09 DIAGNOSIS — R279 Unspecified lack of coordination: Secondary | ICD-10-CM | POA: Diagnosis not present

## 2023-09-09 DIAGNOSIS — D6959 Other secondary thrombocytopenia: Secondary | ICD-10-CM | POA: Diagnosis present

## 2023-09-09 DIAGNOSIS — R339 Retention of urine, unspecified: Secondary | ICD-10-CM | POA: Diagnosis not present

## 2023-09-09 DIAGNOSIS — M6281 Muscle weakness (generalized): Secondary | ICD-10-CM | POA: Diagnosis not present

## 2023-09-09 DIAGNOSIS — R0989 Other specified symptoms and signs involving the circulatory and respiratory systems: Secondary | ICD-10-CM | POA: Diagnosis not present

## 2023-09-09 LAB — BASIC METABOLIC PANEL
Anion gap: 16 — ABNORMAL HIGH (ref 5–15)
BUN: 14 mg/dL (ref 8–23)
CO2: 25 mmol/L (ref 22–32)
Calcium: 9.2 mg/dL (ref 8.9–10.3)
Chloride: 95 mmol/L — ABNORMAL LOW (ref 98–111)
Creatinine, Ser: 1.19 mg/dL (ref 0.61–1.24)
GFR, Estimated: >60 mL/min (ref >60)
Glucose, Bld: 121 mg/dL — ABNORMAL HIGH (ref 70–99)
Potassium: 4.2 mmol/L (ref 3.5–5.1)
Sodium: 136 mmol/L (ref 135–145)

## 2023-09-09 LAB — RESP PANEL BY RT-PCR (RSV, FLU A&B, COVID)  RVPGX2
Influenza A by PCR: NEGATIVE
Influenza B by PCR: NEGATIVE
Resp Syncytial Virus by PCR: NEGATIVE
SARS Coronavirus 2 by RT PCR: POSITIVE — AB

## 2023-09-09 LAB — CBC
HCT: 48.3 % (ref 39.0–52.0)
Hemoglobin: 16.4 g/dL (ref 13.0–17.0)
MCH: 34.9 pg — ABNORMAL HIGH (ref 26.0–34.0)
MCHC: 34 g/dL (ref 30.0–36.0)
MCV: 102.8 fL — ABNORMAL HIGH (ref 80.0–100.0)
Platelets: 97 10*3/uL — ABNORMAL LOW (ref 150–400)
RBC: 4.7 MIL/uL (ref 4.22–5.81)
RDW: 14.7 % (ref 11.5–15.5)
WBC: 8.4 10*3/uL (ref 4.0–10.5)
nRBC: 0 % (ref 0.0–0.2)

## 2023-09-09 MED ORDER — SODIUM CHLORIDE 0.9 % IV BOLUS (SEPSIS)
1000.0000 mL | Freq: Once | INTRAVENOUS | Status: AC
  Administered 2023-09-10: 1000 mL via INTRAVENOUS

## 2023-09-09 NOTE — ED Notes (Signed)
 Lab called and advised blood needs to be recollected. They were advised to send someone down.

## 2023-09-09 NOTE — Telephone Encounter (Signed)
 Please call patient's wife:  Please follow-up on these comments from [place], LPN from below.  If he is experiencing frequent dizzy spells then we need to see him in the office for evaluation.  If they are having trouble with transportation then we can connect them with a child psychotherapist to help.  Let me know.   Nurse Notes from Stuart, LPN: Pt presents with his wife. He talks about having dizzy spells at varied times which were culprit for two falls this month. Pt fell in his yard and states no injuries. Pt's wife says when they have to get out and have no ride the pt drives, which she says is scary. Pt failed all of  6CIT questions. Wife says there is a support group but he is unable to get to it. Pt /wife have no concerns or questions at this time.

## 2023-09-09 NOTE — ED Triage Notes (Addendum)
 Pt to ed from home via [facility] for weakness. Pt has been week for several days per wife. Pt has had poor intake. Pt has HX of dementia but can answer all questions for EMS> Pt did fall tonight prior to calling EMS. No LOC.   134/88 144BGL  94% RA  88 HR   Pt is alert and oriented to self but not to date and time. Pt denies any complaints.

## 2023-09-09 NOTE — ED Provider Notes (Signed)
 [facility] Provider Note    Event Date/Time   First MD Initiated Contact with Patient 09/09/23 2344     (approximate)   History   Weakness   HPI  Kurt Bailey is a 78 y.o. male   Past medical history of Alzheimer's and vascular dementia, gait and balance and frequent falls, prior stroke, here with 1 week of cough and congestion, fatigue, myalgias, generalized weakness and frequent falls throughout the week.  No known sick contacts.  Has not been COVID vaccinated.  No GI complaints.  No complaints of acute trauma from his falls.  Has a remote history of alcohol abuse but no longer drinks and has been abstinent for 30 years  Independent Historian contributed to assessment above: His wife and daughter at bedside to corroborate information past medical history as above  External Medical Documents Reviewed: Neurology note from November 2024 documenting his past medical history including prior CVA, gait instability, dementia      Physical Exam   Triage Vital Signs: ED Triage Vitals  Encounter Vitals Group     BP 09/09/23 2116 (!) 153/110     Systolic BP Percentile --      Diastolic BP Percentile --      Pulse Rate 09/09/23 2116 (!) 111     Resp 09/09/23 2116 20     Temp 09/09/23 2116 98.8 F (37.1 C)     Temp src --      SpO2 09/09/23 2116 92 %     Weight 09/09/23 2114 191 lb 12.8 oz (87 kg)     Height 09/09/23 2114 5' 8 (1.727 m)     Head Circumference --      Peak Flow --      Pain Score 09/09/23 2114 0     Pain Loc --      Pain Education --      Exclude from Growth Chart --     Most recent vital signs: Vitals:   09/09/23 2116  BP: (!) 153/110  Pulse: (!) 111  Resp: 20  Temp: 98.8 F (37.1 C)  SpO2: 92%    General: Awake, no distress.  CV:  Good peripheral perfusion.  Resp:  Normal effort.  Abd:  No distention. Other:  Tachycardic hypertensive otherwise vital signs are normal.  No respiratory distress, clear lungs  with no wheezing or focality.  Soft nontender abdomen to palpation all quadrants.  Appears dehydrated with dry mucous membranes, cracked lips, poor skin turgor, and delayed cap refill.  He has no facial asymmetry, no dysarthria, motor or sensory exam is grossly intact though he is globally weak.  Finger-to-nose is normal.   ED Results / Procedures / Treatments   Labs (all labs ordered are listed, but only abnormal results are displayed) Labs Reviewed  RESP PANEL BY RT-PCR (RSV, FLU A&B, COVID)  RVPGX2 - Abnormal; Notable for the following components:      Result Value   SARS Coronavirus 2 by RT PCR POSITIVE (*)    All other components within normal limits  CBC - Abnormal; Notable for the following components:   MCV 102.8 (*)    MCH 34.9 (*)    Platelets 97 (*)    All other components within normal limits  BASIC METABOLIC PANEL - Abnormal; Notable for the following components:   Chloride 95 (*)    Glucose, Bld 121 (*)    Anion gap 16 (*)    All other components within normal limits  CULTURE, BLOOD (ROUTINE X 2)  CULTURE, BLOOD (ROUTINE X 2)  URINALYSIS, ROUTINE W REFLEX MICROSCOPIC  LACTIC ACID, PLASMA  LACTIC ACID, PLASMA  CK  CBG MONITORING, ED  TROPONIN I (HIGH SENSITIVITY)     I ordered and reviewed the above labs they are notable for COVID-positive, white blood cell count normal  EKG  ED ECG REPORT I, Ginnie Shams, the attending physician, personally viewed and interpreted this ECG.   Date: 09/09/2023  EKG Time: 2119  Rate: 107  Rhythm: sinus tachycardia  Axis: nl  Intervals:none  ST&T Change:  no stemi    RADIOLOGY I independently reviewed and interpreted CT of the head and see no obvious bleeding or midline shift I also reviewed radiologist's formal read.   PROCEDURES:  Critical Care performed: No  Procedures   MEDICATIONS ORDERED IN ED: Medications  sodium chloride  0.9 % bolus 1,000 mL (has no administration in time range)    External physician /  consultants:  I spoke with hospital medicine for admission and regarding care plan for this patient.   IMPRESSION / MDM / ASSESSMENT AND PLAN / ED COURSE  I reviewed the triage vital signs and the nursing notes.                                Patient's presentation is most consistent with acute presentation with potential threat to life or bodily function.  Differential diagnosis includes, but is not limited to, viral URI, sepsis, bacterial pneumonia, traumatic injuries   The patient is on the cardiac monitor to evaluate for evidence of arrhythmia and/or significant heart rate changes.  MDM:    Is a patient with gait instability and dementia at baseline who has been suffering from upper respiratory tract infectious symptoms for 1 week causing debility, increased falls, and is COVID-positive on testing today.  His borderline low O2 saturations at 92%, tachycardic, and so blood cultures lactic were obtained.  Given IV bolus of crystalloid for dehydrated state.  No acute focal deficits to suggest stroke.  No obvious trauma on exam, wearing all extremities with full active range of motion though global weakness, and a CT head and neck were obtained normal.  Admission.       FINAL CLINICAL IMPRESSION(S) / ED DIAGNOSES   Final diagnoses:  COVID-19  Frequent falls  Dehydration     Rx / DC Orders   ED Discharge Orders     None        Note:  This document was prepared using Dragon voice recognition software and may include unintentional dictation errors.    Shams Ginnie, MD 09/10/23 [phone]

## 2023-09-10 DIAGNOSIS — Z83438 Family history of other disorder of lipoprotein metabolism and other lipidemia: Secondary | ICD-10-CM | POA: Diagnosis not present

## 2023-09-10 DIAGNOSIS — F02818 Dementia in other diseases classified elsewhere, unspecified severity, with other behavioral disturbance: Secondary | ICD-10-CM | POA: Diagnosis present

## 2023-09-10 DIAGNOSIS — E663 Overweight: Secondary | ICD-10-CM | POA: Diagnosis present

## 2023-09-10 DIAGNOSIS — R140 Abdominal distension (gaseous): Secondary | ICD-10-CM | POA: Diagnosis not present

## 2023-09-10 DIAGNOSIS — R062 Wheezing: Secondary | ICD-10-CM | POA: Diagnosis not present

## 2023-09-10 DIAGNOSIS — F01518 Vascular dementia, unspecified severity, with other behavioral disturbance: Secondary | ICD-10-CM | POA: Diagnosis present

## 2023-09-10 DIAGNOSIS — E538 Deficiency of other specified B group vitamins: Secondary | ICD-10-CM | POA: Diagnosis not present

## 2023-09-10 DIAGNOSIS — R2689 Other abnormalities of gait and mobility: Secondary | ICD-10-CM | POA: Diagnosis not present

## 2023-09-10 DIAGNOSIS — R296 Repeated falls: Secondary | ICD-10-CM | POA: Diagnosis present

## 2023-09-10 DIAGNOSIS — K567 Ileus, unspecified: Secondary | ICD-10-CM | POA: Diagnosis present

## 2023-09-10 DIAGNOSIS — R278 Other lack of coordination: Secondary | ICD-10-CM | POA: Diagnosis not present

## 2023-09-10 DIAGNOSIS — E785 Hyperlipidemia, unspecified: Secondary | ICD-10-CM | POA: Diagnosis present

## 2023-09-10 DIAGNOSIS — Z28310 Unvaccinated for covid-19: Secondary | ICD-10-CM | POA: Diagnosis not present

## 2023-09-10 DIAGNOSIS — R531 Weakness: Secondary | ICD-10-CM | POA: Diagnosis not present

## 2023-09-10 DIAGNOSIS — R279 Unspecified lack of coordination: Secondary | ICD-10-CM | POA: Diagnosis not present

## 2023-09-10 DIAGNOSIS — Z803 Family history of malignant neoplasm of breast: Secondary | ICD-10-CM | POA: Diagnosis not present

## 2023-09-10 DIAGNOSIS — U071 COVID-19: Principal | ICD-10-CM | POA: Diagnosis present

## 2023-09-10 DIAGNOSIS — D519 Vitamin B12 deficiency anemia, unspecified: Secondary | ICD-10-CM | POA: Diagnosis present

## 2023-09-10 DIAGNOSIS — R41841 Cognitive communication deficit: Secondary | ICD-10-CM | POA: Diagnosis not present

## 2023-09-10 DIAGNOSIS — D6959 Other secondary thrombocytopenia: Secondary | ICD-10-CM | POA: Diagnosis present

## 2023-09-10 DIAGNOSIS — R488 Other symbolic dysfunctions: Secondary | ICD-10-CM | POA: Diagnosis not present

## 2023-09-10 DIAGNOSIS — I10 Essential (primary) hypertension: Secondary | ICD-10-CM | POA: Diagnosis present

## 2023-09-10 DIAGNOSIS — Z8616 Personal history of COVID-19: Secondary | ICD-10-CM | POA: Diagnosis not present

## 2023-09-10 DIAGNOSIS — R0902 Hypoxemia: Secondary | ICD-10-CM | POA: Diagnosis not present

## 2023-09-10 DIAGNOSIS — Z8673 Personal history of transient ischemic attack (TIA), and cerebral infarction without residual deficits: Secondary | ICD-10-CM | POA: Diagnosis not present

## 2023-09-10 DIAGNOSIS — Z23 Encounter for immunization: Secondary | ICD-10-CM | POA: Diagnosis present

## 2023-09-10 DIAGNOSIS — E860 Dehydration: Secondary | ICD-10-CM | POA: Diagnosis present

## 2023-09-10 DIAGNOSIS — E878 Other disorders of electrolyte and fluid balance, not elsewhere classified: Secondary | ICD-10-CM | POA: Diagnosis present

## 2023-09-10 DIAGNOSIS — G4700 Insomnia, unspecified: Secondary | ICD-10-CM | POA: Diagnosis present

## 2023-09-10 DIAGNOSIS — G309 Alzheimer's disease, unspecified: Secondary | ICD-10-CM | POA: Diagnosis present

## 2023-09-10 DIAGNOSIS — M6281 Muscle weakness (generalized): Secondary | ICD-10-CM | POA: Diagnosis not present

## 2023-09-10 DIAGNOSIS — Z8249 Family history of ischemic heart disease and other diseases of the circulatory system: Secondary | ICD-10-CM | POA: Diagnosis not present

## 2023-09-10 DIAGNOSIS — R0989 Other specified symptoms and signs involving the circulatory and respiratory systems: Secondary | ICD-10-CM | POA: Diagnosis not present

## 2023-09-10 DIAGNOSIS — E876 Hypokalemia: Secondary | ICD-10-CM | POA: Diagnosis present

## 2023-09-10 DIAGNOSIS — G4709 Other insomnia: Secondary | ICD-10-CM | POA: Diagnosis not present

## 2023-09-10 DIAGNOSIS — Z823 Family history of stroke: Secondary | ICD-10-CM | POA: Diagnosis not present

## 2023-09-10 DIAGNOSIS — R338 Other retention of urine: Secondary | ICD-10-CM | POA: Diagnosis not present

## 2023-09-10 DIAGNOSIS — Z825 Family history of asthma and other chronic lower respiratory diseases: Secondary | ICD-10-CM | POA: Diagnosis not present

## 2023-09-10 DIAGNOSIS — D696 Thrombocytopenia, unspecified: Secondary | ICD-10-CM | POA: Diagnosis not present

## 2023-09-10 DIAGNOSIS — Z743 Need for continuous supervision: Secondary | ICD-10-CM | POA: Diagnosis not present

## 2023-09-10 LAB — CBC
HCT: 35.9 % — ABNORMAL LOW (ref 39.0–52.0)
Hemoglobin: 12.2 g/dL — ABNORMAL LOW (ref 13.0–17.0)
MCH: 35.1 pg — ABNORMAL HIGH (ref 26.0–34.0)
MCHC: 34 g/dL (ref 30.0–36.0)
MCV: 103.2 fL — ABNORMAL HIGH (ref 80.0–100.0)
Platelets: 99 10*3/uL — ABNORMAL LOW (ref 150–400)
RBC: 3.48 MIL/uL — ABNORMAL LOW (ref 4.22–5.81)
RDW: 14.6 % (ref 11.5–15.5)
WBC: 5.4 10*3/uL (ref 4.0–10.5)
nRBC: 0 % (ref 0.0–0.2)

## 2023-09-10 LAB — C-REACTIVE PROTEIN: CRP: 1.5 mg/dL — ABNORMAL HIGH (ref <1.0)

## 2023-09-10 LAB — BASIC METABOLIC PANEL
Anion gap: 8 (ref 5–15)
BUN: 13 mg/dL (ref 8–23)
CO2: 24 mmol/L (ref 22–32)
Calcium: 8.1 mg/dL — ABNORMAL LOW (ref 8.9–10.3)
Chloride: 106 mmol/L (ref 98–111)
Creatinine, Ser: 1 mg/dL (ref 0.61–1.24)
GFR, Estimated: >60 mL/min (ref >60)
Glucose, Bld: 104 mg/dL — ABNORMAL HIGH (ref 70–99)
Potassium: 3.8 mmol/L (ref 3.5–5.1)
Sodium: 138 mmol/L (ref 135–145)

## 2023-09-10 LAB — URINALYSIS, ROUTINE W REFLEX MICROSCOPIC
Bacteria, UA: NONE SEEN
Bilirubin Urine: NEGATIVE
Glucose, UA: NEGATIVE mg/dL
Ketones, ur: 5 mg/dL — AB
Leukocytes,Ua: NEGATIVE
Nitrite: NEGATIVE
Protein, ur: NEGATIVE mg/dL
Specific Gravity, Urine: 1.014 (ref 1.005–1.030)
Squamous Epithelial / HPF: 0 /[HPF] (ref 0–5)
pH: 5 (ref 5.0–8.0)

## 2023-09-10 LAB — CBG MONITORING, ED: Glucose-Capillary: 130 mg/dL — ABNORMAL HIGH (ref 70–99)

## 2023-09-10 LAB — GLUCOSE, CAPILLARY: Glucose-Capillary: 123 mg/dL — ABNORMAL HIGH (ref 70–99)

## 2023-09-10 LAB — LACTIC ACID, PLASMA
Lactic Acid, Venous: 2.1 mmol/L (ref 0.5–1.9)
Lactic Acid, Venous: 1.5 mmol/L (ref 0.5–1.9)

## 2023-09-10 LAB — FERRITIN: Ferritin: 132 ng/mL (ref 24–336)

## 2023-09-10 LAB — PROCALCITONIN: Procalcitonin: <0.1 ng/mL

## 2023-09-10 LAB — TROPONIN I (HIGH SENSITIVITY)
Troponin I (High Sensitivity): 15 ng/L (ref <18)
Troponin I (High Sensitivity): 17 ng/L (ref <18)

## 2023-09-10 LAB — CK: Total CK: 117 U/L (ref 49–397)

## 2023-09-10 LAB — LACTATE DEHYDROGENASE: LDH: 189 U/L (ref 98–192)

## 2023-09-10 MED ORDER — IPRATROPIUM-ALBUTEROL 0.5-2.5 (3) MG/3ML IN SOLN
3.0000 mL | RESPIRATORY_TRACT | Status: DC | PRN
  Administered 2023-09-10 – 2023-09-11 (×3): 3 mL via RESPIRATORY_TRACT
  Filled 2023-09-10 (×3): qty 3

## 2023-09-10 MED ORDER — ONDANSETRON HCL 4 MG/2ML IJ SOLN: 4.0000 mg | Freq: Four times a day (QID) | INTRAMUSCULAR | Status: DC | PRN

## 2023-09-10 MED ORDER — INFLUENZA VAC A&B SURF ANT ADJ 0.5 ML IM SUSY
0.5000 mL | PREFILLED_SYRINGE | INTRAMUSCULAR | Status: AC
  Administered 2023-09-12: 0.5 mL via INTRAMUSCULAR
  Filled 2023-09-10 (×2): qty 0.5

## 2023-09-10 MED ORDER — LOSARTAN POTASSIUM 50 MG PO TABS
50.0000 mg | ORAL_TABLET | Freq: Every day | ORAL | Status: DC
  Administered 2023-09-10: 50 mg via ORAL
  Filled 2023-09-10: qty 1

## 2023-09-10 MED ORDER — ZINC SULFATE 220 (50 ZN) MG PO CAPS
220.0000 mg | ORAL_CAPSULE | Freq: Every day | ORAL | Status: DC
  Filled 2023-09-10: qty 1

## 2023-09-10 MED ORDER — ACETAMINOPHEN 325 MG PO TABS
650.0000 mg | ORAL_TABLET | Freq: Four times a day (QID) | ORAL | Status: DC | PRN
  Administered 2023-09-10 – 2023-09-18 (×5): 650 mg via ORAL
  Filled 2023-09-10 (×5): qty 2

## 2023-09-10 MED ORDER — ENOXAPARIN SODIUM 40 MG/0.4ML IJ SOSY
40.0000 mg | PREFILLED_SYRINGE | INTRAMUSCULAR | Status: DC
  Administered 2023-09-10 – 2023-09-19 (×10): 40 mg via SUBCUTANEOUS
  Filled 2023-09-10 (×10): qty 0.4

## 2023-09-10 MED ORDER — ATORVASTATIN CALCIUM 20 MG PO TABS
40.0000 mg | ORAL_TABLET | Freq: Every day | ORAL | Status: DC
  Administered 2023-09-10 – 2023-09-19 (×10): 40 mg via ORAL
  Filled 2023-09-10 (×10): qty 2

## 2023-09-10 MED ORDER — OMEGA-3-ACID ETHYL ESTERS 1 G PO CAPS
1000.0000 mg | ORAL_CAPSULE | Freq: Every evening | ORAL | Status: DC
  Administered 2023-09-10 – 2023-09-18 (×9): 1000 mg via ORAL
  Filled 2023-09-10 (×9): qty 1

## 2023-09-10 MED ORDER — LOSARTAN POTASSIUM 25 MG PO TABS: 25.0000 mg | ORAL_TABLET | Freq: Every day | ORAL | 0 refills | Status: DC

## 2023-09-10 MED ORDER — MAGNESIUM HYDROXIDE 400 MG/5ML PO SUSP: 30.0000 mL | Freq: Every day | ORAL | Status: DC | PRN

## 2023-09-10 MED ORDER — ENSURE ENLIVE PO LIQD
237.0000 mL | Freq: Two times a day (BID) | ORAL | Status: DC
  Administered 2023-09-11 – 2023-09-13 (×4): 237 mL via ORAL

## 2023-09-10 MED ORDER — HYDRALAZINE HCL 20 MG/ML IJ SOLN
10.0000 mg | Freq: Four times a day (QID) | INTRAMUSCULAR | Status: DC | PRN
  Administered 2023-09-10 – 2023-09-11 (×2): 10 mg via INTRAVENOUS
  Filled 2023-09-10 (×2): qty 1

## 2023-09-10 MED ORDER — MEMANTINE HCL 5 MG PO TABS
10.0000 mg | ORAL_TABLET | Freq: Two times a day (BID) | ORAL | Status: DC
  Administered 2023-09-10 – 2023-09-19 (×19): 10 mg via ORAL
  Filled 2023-09-10 (×19): qty 2

## 2023-09-10 MED ORDER — ONDANSETRON HCL 4 MG PO TABS
4.0000 mg | ORAL_TABLET | Freq: Four times a day (QID) | ORAL | Status: DC | PRN
  Filled 2023-09-10: qty 1

## 2023-09-10 MED ORDER — LABETALOL HCL 5 MG/ML IV SOLN
20.0000 mg | INTRAVENOUS | Status: DC | PRN
  Administered 2023-09-10: 20 mg via INTRAVENOUS
  Filled 2023-09-10 (×2): qty 4

## 2023-09-10 MED ORDER — DM-GUAIFENESIN ER 30-600 MG PO TB12
1.0000 | ORAL_TABLET | Freq: Two times a day (BID) | ORAL | Status: DC | PRN
  Administered 2023-09-11 – 2023-09-17 (×2): 1 via ORAL
  Filled 2023-09-10 (×2): qty 1

## 2023-09-10 MED ORDER — PNEUMOCOCCAL 20-VAL CONJ VACC 0.5 ML IM SUSY
0.5000 mL | PREFILLED_SYRINGE | INTRAMUSCULAR | Status: AC
  Administered 2023-09-12: 0.5 mL via INTRAMUSCULAR
  Filled 2023-09-10: qty 0.5

## 2023-09-10 MED ORDER — SODIUM CHLORIDE 0.9 % IV SOLN: INTRAVENOUS | Status: AC

## 2023-09-10 MED ORDER — DIVALPROEX SODIUM 500 MG PO DR TAB
500.0000 mg | DELAYED_RELEASE_TABLET | Freq: Two times a day (BID) | ORAL | Status: DC
  Administered 2023-09-10 – 2023-09-19 (×19): 500 mg via ORAL
  Filled 2023-09-10 (×19): qty 1

## 2023-09-10 MED ORDER — CYANOCOBALAMIN 500 MCG PO TABS: 1000.0000 ug | ORAL_TABLET | Freq: Every evening | ORAL | Status: DC

## 2023-09-10 MED ORDER — ACETAMINOPHEN 650 MG RE SUPP: 650.0000 mg | Freq: Four times a day (QID) | RECTAL | Status: DC | PRN

## 2023-09-10 MED ORDER — VITAMIN B-12 1000 MCG PO TABS
1000.0000 ug | ORAL_TABLET | Freq: Every day | ORAL | Status: DC
  Administered 2023-09-10 – 2023-09-19 (×10): 1000 ug via ORAL
  Filled 2023-09-10 (×8): qty 1
  Filled 2023-09-10: qty 2
  Filled 2023-09-10: qty 1

## 2023-09-10 MED ORDER — TRAZODONE HCL 50 MG PO TABS
25.0000 mg | ORAL_TABLET | Freq: Every evening | ORAL | Status: DC | PRN
  Filled 2023-09-10: qty 1

## 2023-09-10 MED ORDER — VITAMIN C 500 MG PO TABS
1000.0000 mg | ORAL_TABLET | Freq: Every day | ORAL | Status: DC
  Filled 2023-09-10: qty 2

## 2023-09-10 NOTE — Assessment & Plan Note (Addendum)
 Supportive care.  Will need 10 days of isolation here in the hospital before going out to rehab.  Patient wheezing today we will add standing dose nebulizers to see if this will improve.  Not having any fever.

## 2023-09-10 NOTE — Plan of Care (Signed)
   Problem: Education: Goal: Knowledge of risk factors and measures for prevention of condition will improve Outcome: Progressing   Problem: Coping: Goal: Psychosocial and spiritual needs will be supported Outcome: Progressing   Problem: Respiratory: Goal: Will maintain a patent airway Outcome: Progressing Goal: Complications related to the disease process, condition or treatment will be avoided or minimized Outcome: Progressing

## 2023-09-10 NOTE — Assessment & Plan Note (Addendum)
 Physical therapy recommending rehab

## 2023-09-10 NOTE — Progress Notes (Signed)
 Approximately 1700-- Pt arrived to room 224A from ED. Upon arrival, COVID precautions initiated, VS obtained, and assessment completed by this RN. All fall precautions in place. Pt accompanied by family, at bedside.

## 2023-09-10 NOTE — ED Notes (Signed)
 Pt was given lunch tray and he had to be assisted with cutting up food.  He was able to walk, although not steadily, with walker and get into chair while his bed was changed. Pt was incontinent of urine and still wasn't mentating well enough to know that he had been shown numerous times where his call bell was and that he needed to use it for assistance.   He ate very little and got food all over his gown and he had to be changed/cleaned up again.  He is aware of himself only and does not have any other orientation.

## 2023-09-10 NOTE — Assessment & Plan Note (Addendum)
Continue atorvastatin

## 2023-09-10 NOTE — ED Notes (Signed)
 Pt was set up for dinner, food was cut up, fluids given as well.

## 2023-09-10 NOTE — H&P (Addendum)
 [place]   PATIENT NAME: Kurt Bailey    MR#:  [id number]  DATE OF BIRTH:  [birth year]  DATE OF ADMISSION:  09/09/2023  PRIMARY CARE PHYSICIAN: Gretta Comer POUR, NP   Patient is coming from: Home  REQUESTING/REFERRING PHYSICIAN: Cyrena Mylar, MD  CHIEF COMPLAINT:   Chief Complaint  Patient presents with   Weakness    HISTORY OF PRESENT ILLNESS:  Kurt Bailey is a 78 y.o. Caucasian male with medical history significant for alcohol abuse, diverticulitis and sleep.,  Presented to the emergency room with acute onset of mostly dry cough and chest congestion, mild dyspnea with associated myalgia, fever and chills as well as a headache.  He did not have any nausea or vomiting or diarrhea.  He has been having recurrent falls with generalized weakness per his daughter who was with him in the ER.  No head injuries or other injuries.  No chest pain or palpitations.  No dysuria, oliguria or hematuria or flank pain.  He denied any headache or dizziness or blurred vision.  ED Course: When he came to the ER, BP was 153/110 with heart rate 111 and pulse continues 92% on room air and later 94.  Labs revealed mild hypochloremia of 95 and anion gap of 16 with otherwise unremarkable BMP.  CK was 117 and LDH 189 with high-sensitivity troponin I 50 and later 17.  Serum ferritin was 132 and lactic acid was 2.1 and later 1.5.  UA was unremarkable.  Blood cultures were drawn. EKG as reviewed by me : ESRD on HD EKG showed sinus tachycardia with rate 107 with PVCs and inferior Q waves as well as anteroseptal Q waves. Imaging: Noncontrasted head CT scan revealed no acute ventricular abnormality.  C-spine CT showed multiple severe degenerative changes with associated multilevel severe osseous neuroforaminal stenosis with no acute displaced fracture or traumatic listhesis.  The patient was given 1 L bolus of IV normal saline.  He will be admitted to an observation medical telemetry bed for further  evaluation and management. PAST MEDICAL HISTORY:   Past Medical History:  Diagnosis Date   Acute hypoxemic respiratory failure due to COVID-19 (HCC) 05/13/2020   Alcohol abuse    Diverticulitis    Periumbilical hernia     PAST SURGICAL HISTORY:  History reviewed. No pertinent surgical history.  SOCIAL HISTORY:   Social History   Tobacco Use   Smoking status: Never   Smokeless tobacco: Never  Substance Use Topics   Alcohol use: Not Currently    FAMILY HISTORY:   Family History  Problem Relation Age of Onset   Alcohol abuse Father    Stroke Father    Hypertension Father    Emphysema Father    Breast cancer Sister    Heart attack Brother    Aneurysm Sister    Stroke Brother    Hyperlipidemia Brother     DRUG ALLERGIES:  No Known Allergies  REVIEW OF SYSTEMS:   ROS As per history of present illness. All pertinent systems were reviewed above. Constitutional, HEENT, cardiovascular, respiratory, GI, GU, musculoskeletal, neuro, psychiatric, endocrine, integumentary and hematologic systems were reviewed and are otherwise negative/unremarkable except for positive findings mentioned above in the HPI.   MEDICATIONS AT HOME:   Prior to Admission medications   Medication Sig Start Date End Date Taking? Authorizing Provider  acetaminophen  (TYLENOL ) 500 MG tablet Take 500 mg by mouth every 6 (six) hours as needed.    [provider]  atorvastatin  (  LIPITOR) 40 MG tablet TAKE 1 TABLET BY MOUTH ONCE DAILY FOR CHOLESTEROL 03/26/23   Clark, Katherine K, NP  cyanocobalamin  1000 MCG tablet Take by mouth.    [provider]  divalproex  (DEPAKOTE ) 500 MG DR tablet Take 500 mg by mouth 2 (two) times daily.    [provider]  memantine  (NAMENDA ) 10 MG tablet Take 10 mg by mouth 2 (two) times daily.  03/26/19 08/26/22  [provider]  Omega-3 1000 MG CAPS Take 1,000 mg by mouth every evening.     [provider]  vitamin B-12 (CYANOCOBALAMIN )  1000 MCG tablet Take 1,000 mcg by mouth every evening.     [provider]      VITAL SIGNS:  Blood pressure (!) 167/88, pulse 80, temperature 98.8 F (37.1 C), temperature source Axillary, resp. rate (!) 21, height 5' 8 (1.727 m), weight 87 kg, SpO2 97%.  PHYSICAL EXAMINATION:  Physical Exam  GENERAL:  78 y.o.-year-old Caucasian male patient lying in the bed with no acute distress.  EYES: Pupils equal, round, reactive to light and accommodation. No scleral icterus. Extraocular muscles intact.  HEENT: Head atraumatic, normocephalic. Oropharynx with and nasopharynx clear.  NECK:  Supple, no jugular venous distention. No thyroid  enlargement, no tenderness.  LUNGS: Normal breath sounds bilaterally, no wheezing, rales,rhonchi or crepitation. No use of accessory muscles of respiration.  CARDIOVASCULAR: Regular rate and rhythm, S1, S2 normal. No murmurs, rubs, or gallops.  ABDOMEN: Soft, nondistended, nontender. Bowel sounds present. No organomegaly or mass.  EXTREMITIES: No pedal edema, cyanosis, or clubbing.  NEUROLOGIC: Cranial nerves II through XII are intact. Muscle strength 5/5 in all extremities. Sensation intact. Gait not checked.  PSYCHIATRIC: The patient is alert and oriented x 3.  Normal affect and good eye contact. SKIN: No obvious rash, lesion, or ulcer.   LABORATORY PANEL:   CBC Recent Labs  Lab 09/10/23 0440  WBC 5.4  HGB 12.2*  HCT 35.9*  PLT 99*   ------------------------------------------------------------------------------------------------------------------  Chemistries  Recent Labs  Lab 09/09/23 2204  NA 136  K 4.2  CL 95*  CO2 25  GLUCOSE 121*  BUN 14  CREATININE 1.19  CALCIUM  9.2   ------------------------------------------------------------------------------------------------------------------  Cardiac Enzymes No results for input(s): TROPONINI in the last 168  hours. ------------------------------------------------------------------------------------------------------------------  RADIOLOGY:  DG Chest Port 1 View Result Date: 09/10/2023 CLINICAL DATA:  Questionable sepsis.  Weakness. EXAM: PORTABLE CHEST 1 VIEW COMPARISON:  05/13/2020 FINDINGS: Low lung volumes. No confluent airspace opacities or effusions. Heart and mediastinal contours are within normal limits. No acute bony abnormality. IMPRESSION: Low lung volumes.  No active disease. Electronically Signed   By: Franky Crease M.D.   On: 09/10/2023 00:08   CT Head Wo Contrast Result Date: 09/09/2023 CLINICAL DATA:  Neuro deficit, acute, stroke suspected; Neck trauma (Age >= 65y) Pt to ed from home via [facility] for weakness EXAM: CT HEAD WITHOUT CONTRAST CT CERVICAL SPINE WITHOUT CONTRAST TECHNIQUE: Multidetector CT imaging of the head and cervical spine was performed following the standard protocol without intravenous contrast. Multiplanar CT image reconstructions of the cervical spine were also generated. RADIATION DOSE REDUCTION: This exam was performed according to the departmental dose-optimization program which includes automated exposure control, adjustment of the mA and/or kV according to patient size and/or use of iterative reconstruction technique. COMPARISON:  MRI head 06/06/2023 FINDINGS: CT HEAD FINDINGS Brain: Cerebral ventricle sizes are concordant with the degree of cerebral volume loss. Patchy and confluent areas of decreased attenuation are noted throughout the deep  and periventricular white matter of the cerebral hemispheres bilaterally, compatible with chronic microvascular ischemic disease. 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 No evidence of large-territorial acute infarction. No parenchymal hemorrhage. No mass lesion. No extra-axial collection. No mass effect or midline shift. No hydrocephalus. Basilar cisterns are patent. Vascular: No hyperdense  vessel. Atherosclerotic calcifications are present within the cavernous internal carotid arteries. Skull: No acute fracture or focal lesion. Sinuses/Orbits: Bilateral maxillary, sphenoid, ethmoid sinus mucosal thickening. Otherwise paranasal sinuses and mastoid air cells are clear. The orbits are unremarkable. Other: None. CT CERVICAL SPINE FINDINGS Alignment: Normal. Skull base and vertebrae: Multilevel severe degenerative changes spine with associated multilevel severe osseous neural foraminal stenosis. No severe osseous central canal stenosis. No acute fracture. No aggressive appearing focal osseous lesion or focal pathologic process. Soft tissues and spinal canal: No prevertebral fluid or swelling. No visible canal hematoma. Upper chest: Unremarkable. Other: None. IMPRESSION: 1. No acute intracranial abnormality. 2. No acute displaced fracture or traumatic listhesis of the cervical spine. 3. Multilevel severe degenerative changes spine with associated multilevel severe osseous neural foraminal stenosis. Electronically Signed   By: Morgane  Naveau M.D.   On: 09/09/2023 21:42   CT Cervical Spine Wo Contrast Result Date: 09/09/2023 CLINICAL DATA:  Neuro deficit, acute, stroke suspected; Neck trauma (Age >= 65y) Pt to ed from home via [facility] for weakness EXAM: CT HEAD WITHOUT CONTRAST CT CERVICAL SPINE WITHOUT CONTRAST TECHNIQUE: Multidetector CT imaging of the head and cervical spine was performed following the standard protocol without intravenous contrast. Multiplanar CT image reconstructions of the cervical spine were also generated. RADIATION DOSE REDUCTION: This exam was performed according to the departmental dose-optimization program which includes automated exposure control, adjustment of the mA and/or kV according to patient size and/or use of iterative reconstruction technique. COMPARISON:  MRI head 06/06/2023 FINDINGS: CT HEAD FINDINGS Brain: Cerebral ventricle sizes are concordant with the degree of  cerebral volume loss. Patchy and confluent areas of decreased attenuation are noted throughout the deep and periventricular white matter of the cerebral hemispheres bilaterally, compatible with chronic microvascular ischemic disease. 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 No evidence of large-territorial acute infarction. No parenchymal hemorrhage. No mass lesion. No extra-axial collection. No mass effect or midline shift. No hydrocephalus. Basilar cisterns are patent. Vascular: No hyperdense vessel. Atherosclerotic calcifications are present within the cavernous internal carotid arteries. Skull: No acute fracture or focal lesion. Sinuses/Orbits: Bilateral maxillary, sphenoid, ethmoid sinus mucosal thickening. Otherwise paranasal sinuses and mastoid air cells are clear. The orbits are unremarkable. Other: None. CT CERVICAL SPINE FINDINGS Alignment: Normal. Skull base and vertebrae: Multilevel severe degenerative changes spine with associated multilevel severe osseous neural foraminal stenosis. No severe osseous central canal stenosis. No acute fracture. No aggressive appearing focal osseous lesion or focal pathologic process. Soft tissues and spinal canal: No prevertebral fluid or swelling. No visible canal hematoma. Upper chest: Unremarkable. Other: None. IMPRESSION: 1. No acute intracranial abnormality. 2. No acute displaced fracture or traumatic listhesis of the cervical spine. 3. Multilevel severe degenerative changes spine with associated multilevel severe osseous neural foraminal stenosis. Electronically Signed   By: Morgane  Naveau M.D.   On: 09/09/2023 21:42      IMPRESSION AND PLAN:  Assessment and Plan: * COVID-19 - The patient be admitted to a medical telemetry observation isolation bed. - He will be placed on hydration with IV normal saline. - Vitamin C  and zinc  sulfate will be provided. - Will check inflammatory markers. -  He has no current hypoxia  or altered mental status to warrant steroids.   Generalized weakness - This could be associated with COVID-19. - He has been having recurrent falls. - PT consult will be obtained to assess his ambulation.  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  Vitamin B12 deficiency - We will continue vitamin B12.       DVT prophylaxis: Lovenox .  Advanced Care Planning:  Code Status: full code.  Family Communication:  The plan of care was discussed in details with the patient (and family). I answered all questions. The patient agreed to proceed with the above mentioned plan. Further management will depend upon hospital course. Disposition Plan: Back to previous home environment Consults called: none.  All the records are reviewed and case discussed with ED provider.  Status is: Observation  I certify that at the time of admission, it is my clinical judgment that the patient will require  hospital care extending less than 2 midnights.                            Dispo: The patient is from: Home              Anticipated d/c is to: Home              Patient currently is not medically stable to d/c.              Difficult to place patient: No  Madison DELENA Peaches M.D on 09/10/2023 at 5:08 AM  [facility]   From 7 PM-7 AM, contact night-coverage [email or web address]  CC: Primary care physician; Clark, Katherine K, NP

## 2023-09-10 NOTE — Hospital Course (Addendum)
 Kurt Bailey is 78 y.o. male with alcohol abuse, diverticulitis who presents to the ED with cough, chest congestion, dyspnea and myalgias.  Is also been having recurrent falls with generalized weakness per his daughter who brought him to the ER.  In the ED he was found to mildly tachycardic and hypertensive.  Labs mostly within normal limits.  Head CT is unremarkable, C-spine CT revealed severe degenerative changes with multilevel severe osseous neuroforaminal stenosis without acutely displaced fractures or traumatic listhesis.  Patient tested positive for COVID-19.  By reevaluation on 2/8 patient reported feeling weak but was overall all stable.  Physical therapy recommending rehab.  Rehab facility will not take until 2/18 (will need to complete COVID isolation).  2/13.  Cut back Norvasc  to 2.5 mg and Cozaar  to 50 mg 2/14.  Will discontinue Norvasc  and cut back Cozaar  to 25 mg. 2/15.  Discontinue Cozaar .  Wheezing today will add standing dose nebulizers. 2/16.  Foley removed and patient urinating 2/17.  Facility will take patient today.  Covid isolation can end at 21:18 tonight.

## 2023-09-10 NOTE — ED Notes (Signed)
 D/c instructions reviewed with pt, pt states understanding. She's calling her daughter to arrange for a ride home

## 2023-09-10 NOTE — Assessment & Plan Note (Addendum)
Continue vitamin B12

## 2023-09-10 NOTE — TOC Initial Note (Signed)
 Transition of Care [facility]) - Initial/Assessment Note    Patient Details  Name: Kurt Bailey MRN: [id number] Date of Birth: [birth year]  Transition of Care [facility]) CM/SW Contact:    Ermalinda Penton [place], [place] Phone Number: 09/10/2023, 4:30 PM  Clinical Narrative:                 Phone call to patient's daughter PATRCIA CROCKER to discuss SNF recommendation. Per patient's daughter, patient resides with spouse and adult son. Patient's daughter is the main contact. Patient is followed by the [facility] and uses [facility] for his pharmacy needs. Patient  has a walker, shower chair and rails to go around bed that daughter just bought, however per daughter he is reluctant to use these items. Skilled nursing process discussed. Fl2 and PASRR to be completed. Per patient's daughter, she would prefer a facility in [place].    Expected Discharge Plan: Skilled Nursing Facility Barriers to Discharge: Continued Medical Work up   Patient Goals and CMS Choice Patient states their goals for this hospitalization and ongoing recovery are:: Per daughter, would like to see him get a little stronger before he goes home CMS Medicare.gov Compare Post Acute Care list provided to:: Patient Represenative (must comment) (daughter) Choice offered to / list presented to : Adult Children      Expected Discharge Plan and Services In-house Referral: Clinical Social Work   Post Acute Care Choice: Skilled Nursing Facility Living arrangements for the past 2 months: Single Family Home Expected Discharge Date: 09/10/23                                    Prior Living Arrangements/Services Living arrangements for the past 2 months: Single Family Home Lives with:: Spouse          Need for Family Participation in Patient Care: Yes (Comment) Care giver support system in place?: Yes (comment) Current home services:  (has a shower chair, rails around bed but refuses to use it) Criminal  Activity/Legal Involvement Pertinent to Current Situation/Hospitalization: No - Comment as needed  Activities of Daily Living      Permission Sought/Granted                  Emotional Assessment   Attitude/Demeanor/Rapport: Unable to Assess Affect (typically observed): Unable to Assess Orientation: : Oriented to Self Alcohol / Substance Use: Not Applicable Psych Involvement: No (comment)  Admission diagnosis:  COVID-19 [U07.1] COVID [U07.1] Patient Active Problem List   Diagnosis Date Noted   COVID-19 09/10/2023   Generalized weakness 09/10/2023   Dyslipidemia 09/10/2023   Vitamin B12 deficiency 09/10/2023   COVID 09/10/2023   Vertigo 11/18/2022   Macrocytosis without anemia 05/03/2022   Eosinophilia 05/03/2022   Tinea unguium 04/01/2021   Pre-diabetes 04/01/2021   Hyperlipidemia 07/04/2019   Vitamin B 12 deficiency 09/20/2018   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 [place] [address] Phone: [phone] Fax: [phone]     Social Drivers of Health (SDOH) Social History: SDOH Screenings   Food Insecurity: No Food Insecurity (09/07/2023)  Housing: Unknown (09/07/2023)  Transportation Needs: No Transportation Needs (09/07/2023)  Utilities: Not At Risk (09/07/2023)  Alcohol Screen: Low Risk  (09/07/2023)  Depression (PHQ2-9): Low Risk  (09/07/2023)  Financial Resource Strain: Low Risk  (  09/07/2023)  Physical Activity: Inactive (09/07/2023)  Social Connections: Socially Isolated (09/07/2023)  Stress: No Stress Concern Present (09/07/2023)  Tobacco Use: Low Risk  (09/09/2023)  Health Literacy: Adequate Health Literacy (09/07/2023)   SDOH Interventions:     Readmission Risk Interventions     No data to display

## 2023-09-10 NOTE — Progress Notes (Signed)
 PROGRESS NOTE    Kurt Bailey  FMW:[id number] DOB: May 05, 1946 DOA: 09/09/2023 PCP: Gretta Comer POUR, NP  Chief Complaint  Patient presents with   Weakness    Hospital Course:  Kurt Bailey is 78 y.o. male with alcohol abuse, diverticulitis who presents to the ED with cough, chest congestion, dyspnea and myalgias.  Is also been having recurrent falls with generalized weakness per his daughter who brought him to the ER.  In the ED he was found to be tachycardic, blood pressure 153/110, hypochloremic, CK1 17, LDH 189, troponin 50 and repeat 17.  Head CT is unremarkable, C-spine CT revealed severe degenerative changes with multilevel severe osseous neuroforaminal stenosis without acutely displaced fractures or traumatic listhesis.  Patient tested positive for COVID-19  Subjective: Patient is drowsy but arousable on evaluation.  He complains mostly of fatigue   Objective: Vitals:   09/10/23 0400 09/10/23 0449 09/10/23 0600 09/10/23 0700  BP: (!) 167/88 (!) 167/88 133/77 (!) 140/84  Pulse: 80  77 66  Resp: (!) 21  20 18   Temp:      TempSrc:      SpO2: 97%  93% 95%  Weight:      Height:        Intake/Output Summary (Last 24 hours) at 09/10/2023 0845 Last data filed at 09/10/2023 0115 Gross per 24 hour  Intake 1000 ml  Output --  Net 1000 ml   Filed Weights   09/09/23 2114  Weight: 87 kg    Examination: General exam: Appears calm and comfortable, NAD  Respiratory system: No work of breathing, symmetric chest wall expansion, occasional cough, no wheeze Cardiovascular system: S1 & S2 heard, RRR.  Gastrointestinal system: Abdomen is nondistended, soft and nontender.  Neuro: Drowsy but arousable, requires prompting. 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  Pro-Cal <1, no need for abx - Continue observation - Continue with gentle IV hydration - Follow-up blood cultures - Not hypoxic, no altered mentation.  No indication for steroids, abx or Paxlovid at this time  Generalized weakness Frequent falls - Suspect some of this is worsening due to acute illness but some of this may be chronic given his longstanding dementia - PT score today: 11 - On discussion with his family it appears he is almost total care at home. - Need significant rehabilitation and assistance.  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    Elevated blood pressure without diagnosis of hypertension - Start losartan   DVT prophylaxis: lovenox    Code Status: Full Code Family Communication: Discussed with patient's daughter Eleanor on the phone Disposition: Will admit to inpatient.  Patient is significantly weak and debilitated, unable to care for himself at home.  TOC consulted for SNF placement. Consultants:    Procedures:    Antimicrobials:  Anti-infectives (From admission, onward)    None       Data Reviewed: I have personally reviewed following labs and imaging studies CBC: Recent Labs  Lab 09/09/23 2118 09/10/23 0440  WBC 8.4  5.4  HGB 16.4 12.2*  HCT 48.3 35.9*  MCV 102.8* 103.2*  PLT 97* 99*   Basic Metabolic Panel: Recent Labs  Lab 09/09/23 2204 09/10/23 0517  NA 136 138  K 4.2 3.8  CL 95* 106  CO2 25 24  GLUCOSE 121* 104*  BUN 14 13  CREATININE 1.19 1.00  CALCIUM  9.2 8.1*   GFR: Estimated Creatinine Clearance: 67.4 mL/min (by C-G formula based on SCr of 1 mg/dL). Liver Function Tests: No results for  input(s): AST, ALT, ALKPHOS, BILITOT, PROT, ALBUMIN in the last 168 hours. CBG: Recent Labs  Lab 09/10/23 0133  GLUCAP 130*    Recent Results (from the past 240 hours)  Resp panel by RT-PCR (RSV, Flu A&B, Covid) Anterior Nasal Swab     Status: Abnormal   Collection Time: 09/09/23  9:18 PM   Specimen: Anterior Nasal Swab  Result Value Ref Range Status   SARS Coronavirus 2 by RT PCR POSITIVE (A) NEGATIVE Final    Comment: (NOTE) SARS-CoV-2 target nucleic acids are DETECTED.  The SARS-CoV-2 RNA is generally detectable in upper respiratory specimens during the acute phase of infection. Positive results are indicative of the presence of the identified virus, but do not rule out bacterial infection or co-infection with other pathogens not detected by the test. Clinical correlation with patient history and other diagnostic information is necessary to determine patient infection status. The expected result is Negative.  Fact Sheet for Patients: bloggercourse.com  Fact Sheet for Healthcare Providers: seriousbroker.it  This test is not yet approved or cleared by the [place]  FDA and  has been authorized for detection and/or diagnosis of SARS-CoV-2 by FDA under an Emergency Use Authorization (EUA).  This EUA will remain in effect (meaning this test can be used) for the duration of  the COVID-19 declaration under Section 564(b)(1) of the A ct, 21 U.S.C. section 360bbb-3(b)(1), unless the authorization is terminated or revoked sooner.     Influenza A by PCR NEGATIVE NEGATIVE Final   Influenza B by PCR NEGATIVE NEGATIVE Final    Comment: (NOTE) The Xpert Xpress SARS-CoV-2/FLU/RSV plus assay is intended as an aid in the diagnosis of influenza from Nasopharyngeal swab specimens and should not be used as a sole basis for treatment. Nasal washings and aspirates are unacceptable for Xpert Xpress  SARS-CoV-2/FLU/RSV testing.  Fact Sheet for Patients: bloggercourse.com  Fact Sheet for Healthcare Providers: seriousbroker.it  This test is not yet approved or cleared by the [place]  FDA and has been authorized for detection and/or diagnosis of SARS-CoV-2 by FDA under an Emergency Use Authorization (EUA). This EUA will remain in effect (meaning this test can be used) for the duration of the COVID-19 declaration under Section 564(b)(1) of the Act, 21 U.S.C. section 360bbb-3(b)(1), unless the authorization is terminated or revoked.     Resp Syncytial Virus by PCR NEGATIVE NEGATIVE Final    Comment: (NOTE) Fact Sheet for Patients: bloggercourse.com  Fact Sheet for Healthcare Providers: seriousbroker.it  This test is not yet approved or cleared by the [place]  FDA and has been authorized for detection and/or diagnosis of SARS-CoV-2 by FDA under an Emergency Use Authorization (EUA). This EUA will remain in effect (meaning this test can be used) for the duration of the COVID-19 declaration under Section 564(b)(1) of the Act, 21 U.S.C. section 360bbb-3(b)(1), unless the authorization is terminated or revoked.  Performed at [facility], [address], [place], [place] [address]   Blood Culture (routine x 2)  Status: None (Preliminary result)   Collection Time: 09/10/23 12:43 AM   Specimen: BLOOD LEFT ARM  Result Value Ref Range Status   Specimen Description BLOOD LEFT ARM  Final   Special Requests   Final    BOTTLES DRAWN AEROBIC AND ANAEROBIC Blood Culture adequate volume   Culture   Final    NO GROWTH < 12 HOURS Performed at [facility], [address], [place], [place] [address]    Report Status PENDING  Incomplete  Blood Culture (routine x 2)     Status: None (Preliminary result)   Collection Time: 09/10/23 12:43 AM   Specimen: BLOOD  LEFT ARM  Result Value Ref Range Status   Specimen Description BLOOD LEFT ARM  Final   Special Requests   Final    BOTTLES DRAWN AEROBIC AND ANAEROBIC Blood Culture results may not be optimal due to an inadequate volume of blood received in culture bottles   Culture   Final    NO GROWTH < 12 HOURS Performed at [facility], [address], [place], [place] [address]    Report Status PENDING  Incomplete     Radiology Studies: DG Chest Port 1 View Result Date: 09/10/2023 CLINICAL DATA:  Questionable sepsis.  Weakness. EXAM: PORTABLE CHEST 1 VIEW COMPARISON:  05/13/2020 FINDINGS: Low lung volumes. No confluent airspace opacities or effusions. Heart and mediastinal contours are within normal limits. No acute bony abnormality. IMPRESSION: Low lung volumes.  No active disease. Electronically Signed   By: Franky Crease M.D.   On: 09/10/2023 00:08   CT Head Wo Contrast Result Date: 09/09/2023 CLINICAL DATA:  Neuro deficit, acute, stroke suspected; Neck trauma (Age >= 65y) Pt to ed from home via [facility] for weakness EXAM: CT HEAD WITHOUT CONTRAST CT CERVICAL SPINE WITHOUT CONTRAST TECHNIQUE: Multidetector CT imaging of the head and cervical spine was performed following the standard protocol without intravenous contrast. Multiplanar CT image reconstructions of the cervical spine were also generated. RADIATION DOSE REDUCTION: This exam was performed according to the departmental dose-optimization program which includes automated exposure control, adjustment of the mA and/or kV according to patient size and/or use of iterative reconstruction technique. COMPARISON:  MRI head 06/06/2023 FINDINGS: CT HEAD FINDINGS Brain: Cerebral ventricle sizes are concordant with the degree of cerebral volume loss. Patchy and confluent areas of decreased attenuation are noted throughout the deep and periventricular white matter of the cerebral hemispheres bilaterally, compatible with chronic microvascular ischemic disease.  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 No evidence of large-territorial acute infarction. No parenchymal hemorrhage. No mass lesion. No extra-axial collection. No mass effect or midline shift. No hydrocephalus. Basilar cisterns are patent. Vascular: No hyperdense vessel. Atherosclerotic calcifications are present within the cavernous internal carotid arteries. Skull: No acute fracture or focal lesion. Sinuses/Orbits: Bilateral maxillary, sphenoid, ethmoid sinus mucosal thickening. Otherwise paranasal sinuses and mastoid air cells are clear. The orbits are unremarkable. Other: None. CT CERVICAL SPINE FINDINGS Alignment: Normal. Skull base and vertebrae: Multilevel severe degenerative changes spine with associated multilevel severe osseous neural foraminal stenosis. No severe osseous central canal stenosis. No acute fracture. No aggressive appearing focal osseous lesion or focal pathologic process. Soft tissues and spinal canal: No prevertebral fluid or swelling. No visible canal hematoma. Upper chest: Unremarkable. Other: None. IMPRESSION: 1. No acute intracranial abnormality. 2. No acute displaced fracture or traumatic listhesis of the cervical spine. 3. Multilevel severe degenerative changes spine with associated multilevel severe osseous neural foraminal stenosis. Electronically Signed  By: Morgane  Naveau M.D.   On: 09/09/2023 21:42   CT Cervical Spine Wo Contrast Result Date: 09/09/2023 CLINICAL DATA:  Neuro deficit, acute, stroke suspected; Neck trauma (Age >= 65y) Pt to ed from home via [facility] for weakness EXAM: CT HEAD WITHOUT CONTRAST CT CERVICAL SPINE WITHOUT CONTRAST TECHNIQUE: Multidetector CT imaging of the head and cervical spine was performed following the standard protocol without intravenous contrast. Multiplanar CT image reconstructions of the cervical spine were also generated. RADIATION DOSE REDUCTION: This exam was performed according to the  departmental dose-optimization program which includes automated exposure control, adjustment of the mA and/or kV according to patient size and/or use of iterative reconstruction technique. COMPARISON:  MRI head 06/06/2023 FINDINGS: CT HEAD FINDINGS Brain: Cerebral ventricle sizes are concordant with the degree of cerebral volume loss. Patchy and confluent areas of decreased attenuation are noted throughout the deep and periventricular white matter of the cerebral hemispheres bilaterally, compatible with chronic microvascular ischemic disease. 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 No evidence of large-territorial acute infarction. No parenchymal hemorrhage. No mass lesion. No extra-axial collection. No mass effect or midline shift. No hydrocephalus. Basilar cisterns are patent. Vascular: No hyperdense vessel. Atherosclerotic calcifications are present within the cavernous internal carotid arteries. Skull: No acute fracture or focal lesion. Sinuses/Orbits: Bilateral maxillary, sphenoid, ethmoid sinus mucosal thickening. Otherwise paranasal sinuses and mastoid air cells are clear. The orbits are unremarkable. Other: None. CT CERVICAL SPINE FINDINGS Alignment: Normal. Skull base and vertebrae: Multilevel severe degenerative changes spine with associated multilevel severe osseous neural foraminal stenosis. No severe osseous central canal stenosis. No acute fracture. No aggressive appearing focal osseous lesion or focal pathologic process. Soft tissues and spinal canal: No prevertebral fluid or swelling. No visible canal hematoma. Upper chest: Unremarkable. Other: None. IMPRESSION: 1. No acute intracranial abnormality. 2. No acute displaced fracture or traumatic listhesis of the cervical spine. 3. Multilevel severe degenerative changes spine with associated multilevel severe osseous neural foraminal stenosis. Electronically Signed   By: Morgane  Naveau M.D.   On: 09/09/2023 21:42     Scheduled Meds:  vitamin C   1,000 mg Oral Daily   atorvastatin   40 mg Oral Daily   cyanocobalamin   1,000 mcg Oral Daily   divalproex   500 mg Oral BID   enoxaparin  (LOVENOX ) injection  40 mg Subcutaneous Q24H   memantine   10 mg Oral BID   omega-3 acid ethyl esters  1,000 mg Oral QPM   zinc  sulfate (50mg  elemental zinc )  220 mg Oral Daily   Continuous Infusions:  sodium chloride  100 mL/hr at 09/10/23 0212     LOS: 0 days    Total time spent coordinating care:  Lorane Poland, DO [facility]  To contact the attending physician between 7A-7P please use Epic Chat. To contact the covering physician during after hours 7P-7A, please review Amion.   09/10/2023, 8:45 AM   *This document has been created with the assistance of dictation software. Please excuse typographical errors. *

## 2023-09-10 NOTE — ED Notes (Signed)
 RN called for labs, have informed phlebotomist Megan twice. Called her at [phone]. If not completed please call her directly.

## 2023-09-10 NOTE — Evaluation (Signed)
 Physical Therapy Evaluation Patient Details Name: Kurt Bailey MRN: [id number] DOB: 11/18/1945 Today's Date: 09/10/2023  History of Present Illness  Pt is a 78 y.o. 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  Pt presented to the emergency room with acute onset of mostly dry cough and chest congestion, mild dyspnea with associated myalgia, fever and chills as well as a headache.  MD assessment includes: Covid-19, general weakness, and recurrent falls.   Clinical Impression  Pt was pleasantly confused and a poor historian with no verification available of below history and PLOF.  Pt required extra time and cuing to follow commands but put forth good effort during the session.  Pt required physical assistance with all functional tasks along with cues for sequencing and presented with poor standing balance.  Pt was only able to take several small steps at the EOB with min A to prevent mostly posterior LOB before fatiguing and needing to return to sitting.  HR and SpO2 WNL during the session on room air.  Pt is at an elevated risk for falls and will benefit from continued PT services upon discharge to safely address deficits listed in patient problem list for decreased caregiver assistance and eventual return to PLOF.         If plan is discharge home, recommend the following: A lot of help with walking and/or transfers;A lot of help with bathing/dressing/bathroom;Assistance with cooking/housework;Direct supervision/assist for medications management;Direct supervision/assist for financial management;Supervision due to cognitive status;Assist for transportation   Can travel by private vehicle   No    Equipment Recommendations Other (comment) (TBD at next venue of care)  Recommendations for Other Services       Functional Status Assessment Patient has had a recent decline in their  functional status and demonstrates the ability to make significant improvements in function in a reasonable and predictable amount of time.     Precautions / Restrictions Precautions Precautions: Fall Restrictions Weight Bearing Restrictions Per Provider Order: No      Mobility  Bed Mobility Overal bed mobility: Needs Assistance Bed Mobility: Supine to Sit, Sit to Supine     Supine to sit: Mod assist Sit to supine: Mod assist   General bed mobility comments: Mod A for BLE and trunk control    Transfers Overall transfer level: Needs assistance Equipment used: Rolling walker (2 wheels) Transfers: Sit to/from Stand Sit to Stand: Min assist, From elevated surface           General transfer comment: Mod verbal cues for hand placement    Ambulation/Gait Ambulation/Gait assistance: Min assist Gait Distance (Feet): 5 Feet Assistive device: Rolling walker (2 wheels) Gait Pattern/deviations: Step-to pattern, Decreased step length - left, Decreased step length - right, Shuffle Gait velocity: decreased     General Gait Details: Pt able to take several effortful steps near the EOB with mod lean on the RW for support and min A for stability and to assist with guiding the RW  Stairs            Wheelchair Mobility     Tilt Bed    Modified Rankin (Stroke Patients Only)       Balance Overall balance assessment: Needs assistance Sitting-balance support: Feet supported, Single extremity supported Sitting balance-Leahy Scale: Good     Standing balance support: Bilateral upper extremity supported, During functional activity, Reliant on assistive device for balance Standing balance-Leahy Scale: Poor  Pertinent Vitals/Pain Pain Assessment Pain Assessment: No/denies pain    Home Living Family/patient expects to be discharged to:: Private residence Living Arrangements: Spouse/significant other Available Help at Discharge:  Family;Available 24 hours/day Type of Home: House Home Access: Level entry       Home Layout: One level Home Equipment: Rollator (4 wheels);Grab bars - tub/shower      Prior Function Prior Level of Function : Independent/Modified Independent;History of Falls (last six months);Patient poor historian/Family not available             Mobility Comments: Ind amb community distances without an AD, multiple falls in the last 6 months secondary to LOB ADLs Comments: Ind with ADLs     Extremity/Trunk Assessment   Upper Extremity Assessment Upper Extremity Assessment: Generalized weakness    Lower Extremity Assessment Lower Extremity Assessment: Generalized weakness       Communication   Communication Communication: No apparent difficulties Cueing Techniques: Verbal cues;Tactile cues;Visual cues  Cognition Arousal: Alert Behavior During Therapy: WFL for tasks assessed/performed Overall Cognitive Status: No family/caregiver present to determine baseline cognitive functioning                                 General Comments: Pt with some difficulties following commands requiring extra time and cuing        General Comments      Exercises     Assessment/Plan    PT Assessment Patient needs continued PT services  PT Problem List Decreased strength;Decreased activity tolerance;Decreased balance;Decreased mobility;Decreased cognition;Decreased safety awareness;Decreased knowledge of use of DME       PT Treatment Interventions DME instruction;Gait training;Functional mobility training;Therapeutic activities;Therapeutic exercise;Balance training;Patient/family education    PT Goals (Current goals can be found in the Care Plan section)  Acute Rehab PT Goals Patient Stated Goal: To get stronger PT Goal Formulation: With patient Time For Goal Achievement: 09/23/23 Potential to Achieve Goals: Good    Frequency Min 1X/week     Co-evaluation                AM-PAC PT 6 Clicks Mobility  Outcome Measure Help needed turning from your back to your side while in a flat bed without using bedrails?: A Little Help needed moving from lying on your back to sitting on the side of a flat bed without using bedrails?: A Lot Help needed moving to and from a bed to a chair (including a wheelchair)?: A Lot Help needed standing up from a chair using your arms (e.g., wheelchair or bedside chair)?: A Lot Help needed to walk in hospital room?: Total Help needed climbing 3-5 steps with a railing? : Total 6 Click Score: 11    End of Session Equipment Utilized During Treatment: Gait belt Activity Tolerance: Patient tolerated treatment well Patient left: in bed;with call bell/phone within reach;Other (comment) (Bilateral ED bed rails up as pt was found) Nurse Communication: Mobility status PT Visit Diagnosis: Unsteadiness on feet (R26.81);History of falling (Z91.81);Difficulty in walking, not elsewhere classified (R26.2);Muscle weakness (generalized) (M62.81)    Time: 8947-8881 PT Time Calculation (min) (ACUTE ONLY): 26 min   Charges:   PT Evaluation $PT Eval Moderate Complexity: 1 Mod   PT General Charges $$ ACUTE PT VISIT: 1 Visit       D. Scott Guerin [place] PT, DPT 09/10/23, 11:50 AM

## 2023-09-10 NOTE — Assessment & Plan Note (Addendum)
Continue Namenda and Depakote.  Patient's mental status improved from admission.

## 2023-09-11 ENCOUNTER — Inpatient Hospital Stay: Payer: Medicare Other

## 2023-09-11 DIAGNOSIS — U071 COVID-19: Secondary | ICD-10-CM | POA: Diagnosis not present

## 2023-09-11 LAB — COMPREHENSIVE METABOLIC PANEL
ALT: 16 U/L (ref 0–44)
AST: 24 U/L (ref 15–41)
Albumin: 3.1 g/dL — ABNORMAL LOW (ref 3.5–5.0)
Alkaline Phosphatase: 45 U/L (ref 38–126)
Anion gap: 11 (ref 5–15)
BUN: 13 mg/dL (ref 8–23)
CO2: 20 mmol/L — ABNORMAL LOW (ref 22–32)
Calcium: 8.2 mg/dL — ABNORMAL LOW (ref 8.9–10.3)
Chloride: 104 mmol/L (ref 98–111)
Creatinine, Ser: 1 mg/dL (ref 0.61–1.24)
GFR, Estimated: >60 mL/min (ref >60)
Glucose, Bld: 99 mg/dL (ref 70–99)
Potassium: 3.4 mmol/L — ABNORMAL LOW (ref 3.5–5.1)
Sodium: 135 mmol/L (ref 135–145)
Total Bilirubin: 1 mg/dL (ref 0.0–1.2)
Total Protein: 6.8 g/dL (ref 6.5–8.1)

## 2023-09-11 LAB — CBC WITH DIFFERENTIAL/PLATELET
Abs Immature Granulocytes: 0.02 10*3/uL (ref 0.00–0.07)
Basophils Absolute: 0.1 10*3/uL (ref 0.0–0.1)
Basophils Relative: 1 %
Eosinophils Absolute: 0.4 10*3/uL (ref 0.0–0.5)
Eosinophils Relative: 6 %
HCT: 43.3 % (ref 39.0–52.0)
Hemoglobin: 14.7 g/dL (ref 13.0–17.0)
Immature Granulocytes: 0 %
Lymphocytes Relative: 12 %
Lymphs Abs: 1 10*3/uL (ref 0.7–4.0)
MCH: 34.7 pg — ABNORMAL HIGH (ref 26.0–34.0)
MCHC: 33.9 g/dL (ref 30.0–36.0)
MCV: 102.1 fL — ABNORMAL HIGH (ref 80.0–100.0)
Monocytes Absolute: 1.1 10*3/uL — ABNORMAL HIGH (ref 0.1–1.0)
Monocytes Relative: 14 %
Neutro Abs: 5.2 10*3/uL (ref 1.7–7.7)
Neutrophils Relative %: 67 %
Platelets: 109 10*3/uL — ABNORMAL LOW (ref 150–400)
RBC: 4.24 MIL/uL (ref 4.22–5.81)
RDW: 14.6 % (ref 11.5–15.5)
WBC: 7.8 10*3/uL (ref 4.0–10.5)
nRBC: 0 % (ref 0.0–0.2)

## 2023-09-11 LAB — PHOSPHORUS: Phosphorus: 3.3 mg/dL (ref 2.5–4.6)

## 2023-09-11 LAB — MAGNESIUM: Magnesium: 2.3 mg/dL (ref 1.7–2.4)

## 2023-09-11 MED ORDER — AMLODIPINE BESYLATE 5 MG PO TABS
5.0000 mg | ORAL_TABLET | Freq: Every day | ORAL | Status: DC
  Administered 2023-09-11 – 2023-09-15 (×5): 5 mg via ORAL
  Filled 2023-09-11 (×5): qty 1

## 2023-09-11 MED ORDER — LOSARTAN POTASSIUM 50 MG PO TABS
100.0000 mg | ORAL_TABLET | Freq: Every day | ORAL | Status: DC
  Administered 2023-09-11 – 2023-09-15 (×5): 100 mg via ORAL
  Filled 2023-09-11 (×5): qty 2

## 2023-09-11 MED ORDER — LABETALOL HCL 5 MG/ML IV SOLN: 10.0000 mg | INTRAVENOUS | Status: DC | PRN

## 2023-09-11 MED ORDER — HYDRALAZINE HCL 20 MG/ML IJ SOLN: 10.0000 mg | Freq: Four times a day (QID) | INTRAMUSCULAR | Status: DC | PRN

## 2023-09-11 NOTE — Progress Notes (Signed)
 PROGRESS NOTE    [name]  FMW:[id number] DOB: 1945-09-30 DOA: 09/09/2023 PCP: Gretta Comer POUR, NP  Chief Complaint  Patient presents with   Weakness    Hospital Course:  Kurt Bailey is 78 y.o. male with alcohol abuse, diverticulitis who presents to the ED with cough, chest congestion, dyspnea and myalgias.  Is also been having recurrent falls with generalized weakness per his daughter who brought him to the ER.  In the ED he was found to be tachycardic, blood pressure 153/110, hypochloremic, CK1 17, LDH 189, troponin 50 and repeat 17.  Head CT is unremarkable, C-spine CT revealed severe degenerative changes with multilevel severe osseous neuroforaminal stenosis without acutely displaced fractures or traumatic listhesis.  Patient tested positive for COVID-19  Subjective: No acute events overnight. On evaluation today patient with abdominal distention. RN reports some liquid stools.  Objective: Vitals:   09/10/23 2029 09/11/23 0420 09/11/23 0445 09/11/23 0754  BP: (!) 140/76 (!) 162/84 (!) 149/88 (!) 172/89  Pulse: 80 75 81 80  Resp: 20 20  16   Temp: 100.2 F (37.9 C) 98.5 F (36.9 C)  (!) 100.4 F (38 C)  TempSrc: Oral Oral  Oral  SpO2: 94% 93%  92%  Weight:      Height:        Intake/Output Summary (Last 24 hours) at 09/11/2023 0920 Last data filed at 09/11/2023 0424 Gross per 24 hour  Intake 1348.05 ml  Output 900 ml  Net 448.05 ml   Filed Weights   09/09/23 2114  Weight: 87 kg    Examination: General exam: Appears calm and comfortable, NAD  Respiratory system: No work of breathing, symmetric chest wall expansion, occasional cough, no wheeze Cardiovascular system: S1 & S2 heard, RRR.  Gastrointestinal system: Abdomen is nondistended, soft and nontender.  Neuro: Drowsy but arousable, requires prompting. 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  Pro-Cal <1, no need for abx - Continue observation - Continue with gentle IV hydration - Follow-up blood cultures - Not hypoxic, no altered mentation.  No indication for steroids, abx or Paxlovid at this time  Ileus - Seen on KUB, distended abdomen - Was made NPO, has had significant flatulence and BMs since that time. Abdomen now soft. Will reinitiate diet this evening  Generalized weakness Frequent falls - Suspect some of this is worsening due to acute illness but some of this may be chronic given his longstanding dementia - PT score: 11 - On discussion with his family it appears he is almost total care at home. - Need significant rehabilitation and assistance.  Consulted TOC for SNF placement - Cont PT/OT  Mixed Alzheimer's and vascular dementia with behavioral disturbance - Continue home meds  Dyslipidemia - Continue statin  Macrocytic anemia B12 deficiency - Most recent B12 levels are elevated.    Elevated blood pressure without diagnosis of hypertension - Losartan  and Amlodipine  now - PRN Hydralazine /labetolol - My be chronic HTN with acute worsening due to COVID  Illiteracy - Cannot read or write, consents must be verbal. -- RN aware   DVT prophylaxis: lovenox    Code Status: Full Code Family Communication: Discussed with patient's daughter Eleanor,  son-in law, and Patient's wife, at the bedside. Patient's wife Luetishia suffers from health problems as well and has verbalized that she would like daughter, Addie to be HCPOA for Kurt Bailey.   Disposition: Inpatient.  Patient is significantly weak and debilitated, unable to care for himself at home.  TOC consulted for SNF placement. Consultants:    Procedures:     Antimicrobials:  Anti-infectives (From admission, onward)    None       Data Reviewed: I have personally reviewed following labs and imaging studies CBC: Recent Labs  Lab 09/09/23 2118 09/10/23 0440 09/11/23 0447  WBC 8.4 5.4 7.8  NEUTROABS  --   --  5.2  HGB 16.4 12.2* 14.7  HCT 48.3 35.9* 43.3  MCV 102.8* 103.2* 102.1*  PLT 97* 99* 109*   Basic Metabolic Panel: Recent Labs  Lab 09/09/23 2204 09/10/23 0517 09/11/23 0447  NA 136 138 135  K 4.2 3.8 3.4*  CL 95* 106 104  CO2 25 24 20*  GLUCOSE 121* 104* 99  BUN 14 13 13   CREATININE 1.19 1.00 1.00  CALCIUM  9.2 8.1* 8.2*  MG  --   --  2.3  PHOS  --   --  3.3   GFR: Estimated Creatinine Clearance: 67.4 mL/min (by C-G formula based on SCr of 1 mg/dL). Liver Function Tests: Recent Labs  Lab 09/11/23 0447  AST 24  ALT 16  ALKPHOS 45  BILITOT 1.0  PROT 6.8  ALBUMIN 3.1*   CBG: Recent Labs  Lab 09/10/23 0133 09/10/23 1710  GLUCAP 130* 123*    Recent Results (from the past 240 hours)  Resp panel by RT-PCR (RSV, Flu A&B, Covid) Anterior Nasal Swab     Status: Abnormal   Collection Time: 09/09/23  9:18 PM   Specimen: Anterior Nasal Swab  Result Value Ref Range Status   SARS Coronavirus 2 by RT PCR POSITIVE (A) NEGATIVE Final    Comment: (NOTE) SARS-CoV-2 target nucleic acids are DETECTED.  The SARS-CoV-2 RNA is generally detectable in upper respiratory specimens during the acute phase of infection. Positive results are indicative of the presence of the identified virus, but do not rule out bacterial infection or co-infection with other pathogens not detected by the test. Clinical correlation with patient history and other diagnostic information is necessary to determine patient infection status. The expected result is Negative.  Fact Sheet for Patients: bloggercourse.com  Fact Sheet for Healthcare Providers: seriousbroker.it  This test is not  yet approved or cleared by the [place]  FDA and  has been authorized for detection and/or diagnosis of SARS-CoV-2 by FDA under an Emergency Use Authorization (EUA).  This EUA will remain in effect (meaning this test can be used) for the duration of  the COVID-19 declaration under Section 564(b)(1) of the A ct, 21 U.S.C. section 360bbb-3(b)(1), unless the authorization is terminated or revoked sooner.     Influenza A by PCR NEGATIVE NEGATIVE Final   Influenza B by PCR NEGATIVE NEGATIVE Final    Comment: (NOTE) The Xpert Xpress SARS-CoV-2/FLU/RSV plus assay is intended as an aid in the diagnosis of influenza from Nasopharyngeal swab specimens and should not be used as a sole basis for treatment. Nasal washings and aspirates are unacceptable for Xpert Xpress SARS-CoV-2/FLU/RSV testing.  Fact Sheet for Patients: bloggercourse.com  Fact Sheet for Healthcare Providers: seriousbroker.it  This test is not yet approved or cleared by the [place]  FDA and has been authorized for detection and/or diagnosis of SARS-CoV-2 by FDA under  an Emergency Use Authorization (EUA). This EUA will remain in effect (meaning this test can be used) for the duration of the COVID-19 declaration under Section 564(b)(1) of the Act, 21 U.S.C. section 360bbb-3(b)(1), unless the authorization is terminated or revoked.     Resp Syncytial Virus by PCR NEGATIVE NEGATIVE Final    Comment: (NOTE) Fact Sheet for Patients: bloggercourse.com  Fact Sheet for Healthcare Providers: seriousbroker.it  This test is not yet approved or cleared by the [place]  FDA and has been authorized for detection and/or diagnosis of SARS-CoV-2 by FDA under an Emergency Use Authorization (EUA). This EUA will remain in effect (meaning this test can be used) for the duration of the COVID-19 declaration under Section 564(b)(1)  of the Act, 21 U.S.C. section 360bbb-3(b)(1), unless the authorization is terminated or revoked.  Performed at [facility], [address] [place], [place] [address]   Blood Culture (routine x 2)     Status: None (Preliminary result)   Collection Time: 09/10/23 12:43 AM   Specimen: BLOOD LEFT ARM  Result Value Ref Range Status   Specimen Description BLOOD LEFT ARM  Final   Special Requests   Final    BOTTLES DRAWN AEROBIC AND ANAEROBIC Blood Culture adequate volume   Culture   Final    NO GROWTH 1 DAY Performed at [facility], [address] [place], [place] [address]    Report Status PENDING  Incomplete  Blood Culture (routine x 2)     Status: None (Preliminary result)   Collection Time: 09/10/23 12:43 AM   Specimen: BLOOD LEFT ARM  Result Value Ref Range Status   Specimen Description BLOOD LEFT ARM  Final   Special Requests   Final    BOTTLES DRAWN AEROBIC AND ANAEROBIC Blood Culture results may not be optimal due to an inadequate volume of blood received in culture bottles   Culture   Final    NO GROWTH 1 DAY Performed at [facility], [address], [place], [place] [address]    Report Status PENDING  Incomplete     Radiology Studies: DG Chest Port 1 View Result Date: 09/10/2023 CLINICAL DATA:  Questionable sepsis.  Weakness. EXAM: PORTABLE CHEST 1 VIEW COMPARISON:  05/13/2020 FINDINGS: Low lung volumes. No confluent airspace opacities or effusions. Heart and mediastinal contours are within normal limits. No acute bony abnormality. IMPRESSION: Low lung volumes.  No active disease. Electronically Signed   By: Franky Crease M.D.   On: 09/10/2023 00:08   CT Head Wo Contrast Result Date: 09/09/2023 CLINICAL DATA:  Neuro deficit, acute, stroke suspected; Neck trauma (Age >= 65y) Pt to ed from home via [facility] for weakness EXAM: CT HEAD WITHOUT CONTRAST CT CERVICAL SPINE WITHOUT CONTRAST TECHNIQUE: Multidetector CT imaging of the head and cervical  spine was performed following the standard protocol without intravenous contrast. Multiplanar CT image reconstructions of the cervical spine were also generated. RADIATION DOSE REDUCTION: This exam was performed according to the departmental dose-optimization program which includes automated exposure control, adjustment of the mA and/or kV according to patient size and/or use of iterative reconstruction technique. COMPARISON:  MRI head 06/06/2023 FINDINGS: CT HEAD FINDINGS Brain: Cerebral ventricle sizes are concordant with the degree of cerebral volume loss. Patchy and confluent areas of decreased attenuation are noted throughout the deep and periventricular white matter of the cerebral hemispheres bilaterally, compatible with chronic microvascular ischemic disease. 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 No evidence of large-territorial acute infarction. No  parenchymal hemorrhage. No mass lesion. No extra-axial collection. No mass effect or midline shift. No hydrocephalus. Basilar cisterns are patent. Vascular: No hyperdense vessel. Atherosclerotic calcifications are present within the cavernous internal carotid arteries. Skull: No acute fracture or focal lesion. Sinuses/Orbits: Bilateral maxillary, sphenoid, ethmoid sinus mucosal thickening. Otherwise paranasal sinuses and mastoid air cells are clear. The orbits are unremarkable. Other: None. CT CERVICAL SPINE FINDINGS Alignment: Normal. Skull base and vertebrae: Multilevel severe degenerative changes spine with associated multilevel severe osseous neural foraminal stenosis. No severe osseous central canal stenosis. No acute fracture. No aggressive appearing focal osseous lesion or focal pathologic process. Soft tissues and spinal canal: No prevertebral fluid or swelling. No visible canal hematoma. Upper chest: Unremarkable. Other: None. IMPRESSION: 1. No acute intracranial abnormality. 2. No acute displaced fracture or  traumatic listhesis of the cervical spine. 3. Multilevel severe degenerative changes spine with associated multilevel severe osseous neural foraminal stenosis. Electronically Signed   By: Morgane  Naveau M.D.   On: 09/09/2023 21:42   CT Cervical Spine Wo Contrast Result Date: 09/09/2023 CLINICAL DATA:  Neuro deficit, acute, stroke suspected; Neck trauma (Age >= 65y) Pt to ed from home via [facility] for weakness EXAM: CT HEAD WITHOUT CONTRAST CT CERVICAL SPINE WITHOUT CONTRAST TECHNIQUE: Multidetector CT imaging of the head and cervical spine was performed following the standard protocol without intravenous contrast. Multiplanar CT image reconstructions of the cervical spine were also generated. RADIATION DOSE REDUCTION: This exam was performed according to the departmental dose-optimization program which includes automated exposure control, adjustment of the mA and/or kV according to patient size and/or use of iterative reconstruction technique. COMPARISON:  MRI head 06/06/2023 FINDINGS: CT HEAD FINDINGS Brain: Cerebral ventricle sizes are concordant with the degree of cerebral volume loss. Patchy and confluent areas of decreased attenuation are noted throughout the deep and periventricular white matter of the cerebral hemispheres bilaterally, compatible with chronic microvascular ischemic disease. 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 No evidence of large-territorial acute infarction. No parenchymal hemorrhage. No mass lesion. No extra-axial collection. No mass effect or midline shift. No hydrocephalus. Basilar cisterns are patent. Vascular: No hyperdense vessel. Atherosclerotic calcifications are present within the cavernous internal carotid arteries. Skull: No acute fracture or focal lesion. Sinuses/Orbits: Bilateral maxillary, sphenoid, ethmoid sinus mucosal thickening. Otherwise paranasal sinuses and mastoid air cells are clear. The orbits are unremarkable. Other: None.  CT CERVICAL SPINE FINDINGS Alignment: Normal. Skull base and vertebrae: Multilevel severe degenerative changes spine with associated multilevel severe osseous neural foraminal stenosis. No severe osseous central canal stenosis. No acute fracture. No aggressive appearing focal osseous lesion or focal pathologic process. Soft tissues and spinal canal: No prevertebral fluid or swelling. No visible canal hematoma. Upper chest: Unremarkable. Other: None. IMPRESSION: 1. No acute intracranial abnormality. 2. No acute displaced fracture or traumatic listhesis of the cervical spine. 3. Multilevel severe degenerative changes spine with associated multilevel severe osseous neural foraminal stenosis. Electronically Signed   By: Morgane  Naveau M.D.   On: 09/09/2023 21:42    Scheduled Meds:  atorvastatin   40 mg Oral Daily   cyanocobalamin   1,000 mcg Oral Daily   divalproex   500 mg Oral BID   enoxaparin  (LOVENOX ) injection  40 mg Subcutaneous Q24H   feeding supplement  237 mL Oral BID BM   influenza vaccine adjuvanted  0.5 mL Intramuscular Tomorrow-1000   losartan   50 mg Oral Daily   memantine   10 mg Oral BID   omega-3 acid ethyl esters  1,000 mg Oral  QPM   pneumococcal 20-valent conjugate vaccine  0.5 mL Intramuscular Tomorrow-1000   Continuous Infusions:     LOS: 1 day    Total time spent coordinating care:  Lorane Poland, DO [facility]  To contact the attending physician between 7A-7P please use Epic Chat. To contact the covering physician during after hours 7P-7A, please review Amion.   09/11/2023, 9:20 AM   *This document has been created with the assistance of dictation software. Please excuse typographical errors. *

## 2023-09-11 NOTE — Plan of Care (Signed)
   Problem: Education: Goal: Knowledge of risk factors and measures for prevention of condition will improve Outcome: Progressing   Problem: Coping: Goal: Psychosocial and spiritual needs will be supported Outcome: Progressing   Problem: Respiratory: Goal: Will maintain a patent airway Outcome: Progressing

## 2023-09-11 NOTE — Evaluation (Signed)
 Occupational Therapy Evaluation Patient Details Name: Kurt Bailey MRN: [id number] DOB: 1946-05-01 Today's Date: 09/11/2023   History of Present Illness Pt is a 78 y.o. 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  Pt presented to the emergency room with acute onset of mostly dry cough and chest congestion, mild dyspnea with associated myalgia, fever and chills as well as a headache.  MD assessment includes: Covid-19, general weakness, and recurrent falls.   Clinical Impression   Pt pleasant and participatory despite confusion. Poor historian overall, PLOF taken from chart review. Pt requires increased time and cues to perform all tasks.  Benefits from cues to support deficits present in executive functioning such as task sequencing, problem-solving, and initiation. Mod-maxA to perform bed mobility, pt requires intermittent hands-on minA for seated balance for grooming tasks. Pt doffs socks with increased time, but requires maxA to don due to motor planning/FMC/weakness. Anticipate maxA for LB ADLs, and +2 for safe transfer performance, transfers not attempted secondary to fatigue and RW not in room (RW obtained and placed in room after eval). Pt will require assist to order meals due to cognition deficits, and minA to provide cues at mealtimes for sustained attention to task (staff aware). Pt would benefit from skilled OT services to address noted impairments and functional limitations (see below for any additional details) in order to maximize safety and independence while minimizing falls risk and caregiver burden. Patient will benefit from continued inpatient follow up therapy, <3 hours/day       If plan is discharge home, recommend the following: Two people to help with walking and/or transfers;A lot of help with bathing/dressing/bathroom;Assistance with feeding;Assistance with  cooking/housework;Direct supervision/assist for medications management;Direct supervision/assist for financial management;Assist for transportation;Help with stairs or ramp for entrance;Supervision due to cognitive status    Functional Status Assessment  Patient has had a recent decline in their functional status and demonstrates the ability to make significant improvements in function in a reasonable and predictable amount of time.  Equipment Recommendations  None recommended by OT (defer)       Precautions / Restrictions Precautions Precautions: Fall Restrictions Weight Bearing Restrictions Per Provider Order: No      Mobility Bed Mobility Overal bed mobility: Needs Assistance Bed Mobility: Sidelying to Sit, Supine to Sit     Supine to sit: Max assist, Mod assist, Used rails, HOB elevated Sit to supine: Mod assist, Max assist, HOB elevated, Used rails   General bed mobility comments: Pt attempting to pull on therapist despite redirectional cuing, mod-max for bed mobility and max multimodal cuing to perform safetly    Transfers Overall transfer level: Needs assistance Equipment used: None Transfers: Bed to chair/wheelchair/BSC            Lateral/Scoot Transfers: Total assist General transfer comment: attempted lateral scooting in bed (RW not in room, staff unable to find and pt too fatigued to attempt end of session). TotalA for lateral scoots, pt returning to supine and +2 total to boost up in bed      Balance Overall balance assessment: Needs assistance Sitting-balance support: Feet supported, Single extremity supported Sitting balance-Leahy Scale: Fair Sitting balance - Comments: occassionally requiring minA to correct posterior lean, minA for balance when socks donned       Standing balance comment: NT  ADL either performed or assessed with clinical judgement   ADL Overall ADL's : Needs assistance/impaired Eating/Feeding:  Minimal assistance;Sitting;Bed level Eating/Feeding Details (indicate cue type and reason): likely minA due to cognitive deficits (RN aware and is helping pt eat/order meals), pt observed to hold cup and drink OJ without difficulties but could not coordinate opening container (likely motor planning deficits with associated weakness) Grooming: Wash/dry hands;Wash/dry face;Sitting;Set up Grooming Details (indicate cue type and reason): occassional minA for seated balance             Lower Body Dressing: Maximal assistance;Sit to/from stand Lower Body Dressing Details (indicate cue type and reason): pt doffs sock without difficulties, minA for seated balance, but maxA to don socks Toilet Transfer: BSC/3in1;+2 for physical assistance;+2 for safety/equipment;Cueing for sequencing;Cueing for safety;Rolling walker (2 wheels) Toilet Transfer Details (indicate cue type and reason): anticipated performance, RW not in room upon OT eval, pt fatiguing quickly sitting EOB, transfer performance deferred. likely +2 for safety due to cog/motor planning Toileting- Clothing Manipulation and Hygiene: Maximal assistance;Bed level       Functional mobility during ADLs: Cueing for safety;Cueing for sequencing General ADL Comments: OOB mobility deferred - anticipate need for +2 assist, and pt fatiguing quickly sitting EOB. Unable to perform lateral scoots with maxA.      Pertinent Vitals/Pain Pain Assessment Pain Assessment: No/denies pain     Extremity/Trunk Assessment Upper Extremity Assessment Upper Extremity Assessment: Generalized weakness   Lower Extremity Assessment Lower Extremity Assessment: Defer to PT evaluation;Generalized weakness   Cervical / Trunk Assessment Cervical / Trunk Assessment: Normal   Communication Communication Communication: No apparent difficulties Cueing Techniques: Verbal cues;Tactile cues;Visual cues   Cognition Arousal: Alert Behavior During Therapy: WFL for tasks  assessed/performed Overall Cognitive Status: No family/caregiver present to determine baseline cognitive functioning                                 General Comments: Alert and oriented to person, place situation but not date. Pt requires additional time to follow commands, benefits from multimodal cuing.     General Comments  HR 84, SpO2 94%> on RA throughout            Home Living Family/patient expects to be discharged to:: Private residence Living Arrangements: Spouse/significant other Available Help at Discharge: Family;Available 24 hours/day Type of Home: House Home Access: Level entry     Home Layout: One level     Bathroom Shower/Tub: Chief Strategy Officer: Standard     Home Equipment: Rollator (4 wheels);Grab bars - tub/shower   Additional Comments: Pt poor historian, info from chart review      Prior Functioning/Environment Prior Level of Function : Patient poor historian/Family not available             Mobility Comments: Ind amb community distances without an AD, multiple falls in the last 6 months secondary to LOB ADLs Comments: Ind with ADLs        OT Problem List: Decreased strength;Decreased range of motion;Decreased activity tolerance;Impaired balance (sitting and/or standing);Decreased coordination;Cardiopulmonary status limiting activity;Decreased cognition;Decreased safety awareness;Decreased knowledge of use of DME or AE;Decreased knowledge of precautions      OT Treatment/Interventions: Self-care/ADL training;Therapeutic exercise;Neuromuscular education;Energy conservation;DME and/or AE instruction;Therapeutic activities;Cognitive remediation/compensation;Patient/family education;Balance training    OT Goals(Current goals can be found in the care plan section) Acute Rehab OT Goals OT Goal Formulation: Patient unable to participate in goal setting  Time For Goal Achievement: 09/25/23 Potential to Achieve Goals: Fair   OT Frequency: Min 1X/week       AM-PAC OT 6 Clicks Daily Activity     Outcome Measure Help from another person eating meals?: A Little Help from another person taking care of personal grooming?: A Little Help from another person toileting, which includes using toliet, bedpan, or urinal?: A Lot Help from another person bathing (including washing, rinsing, drying)?: A Lot Help from another person to put on and taking off regular upper body clothing?: A Lot Help from another person to put on and taking off regular lower body clothing?: A Lot 6 Click Score: 14   End of Session Nurse Communication: Mobility status  Activity Tolerance: Patient limited by fatigue Patient left: in bed;with call bell/phone within reach;with nursing/sitter in room;with bed alarm set (telesitter on)  OT Visit Diagnosis: Repeated falls (R29.6);Muscle weakness (generalized) (M62.81);Unsteadiness on feet (R26.81)                Time: 9142-9066 OT Time Calculation (min): 36 min Charges:  OT General Charges $OT Visit: 1 Visit OT Evaluation $OT Eval Low Complexity: 1 Low OT Treatments $Self Care/Home Management : 8-22 mins  Kianah Harries L. Aftan Vint, OTR/L  09/11/23, 12:59 PM

## 2023-09-11 NOTE — NC FL2 (Signed)
 [place]  MEDICAID [id number] LEVEL OF CARE FORM     IDENTIFICATION  Patient Name: Kurt Bailey Birthdate: 09/13/1945 Sex: male Admission Date (Current Location): 09/09/2023  [facility] and [place] Number:  Chiropodist and Address:  [facility], [address] [place], [place] [address]      Provider Number:    Attending Physician Name and Address:  Leesa Kast, DO  Relative Name and Phone Number:  PATRCIA CROCKER (Daughter)  [phone] [facility])    Current Level of Care: Hospital Recommended Level of Care: Skilled Nursing Facility Prior Approval Number:    Date Approved/Denied:   PASRR Number: pending  Discharge Plan: SNF    Current Diagnoses: Patient Active Problem List   Diagnosis Date Noted   COVID-19 09/10/2023   Generalized weakness 09/10/2023   Dyslipidemia 09/10/2023   Vitamin B12 deficiency 09/10/2023   COVID 09/10/2023   Vertigo 11/18/2022   Macrocytosis without anemia 05/03/2022   Eosinophilia 05/03/2022   Tinea unguium 04/01/2021   Pre-diabetes 04/01/2021   Hyperlipidemia 07/04/2019   Vitamin B 12 deficiency 09/20/2018   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) 09/19/2018    Orientation RESPIRATION BLADDER Height & Weight     Self  Normal Continent Weight: 191 lb 12.8 oz (87 kg) Height:  5' 8 (172.7 cm)  BEHAVIORAL SYMPTOMS/MOOD NEUROLOGICAL BOWEL NUTRITION STATUS      Continent Diet  AMBULATORY STATUS COMMUNICATION OF NEEDS Skin   Extensive Assist Verbally Normal                       Personal Care Assistance Level of Assistance  Bathing, Feeding, Dressing Bathing Assistance: Limited assistance Feeding assistance: Independent Dressing Assistance: Limited assistance     Functional Limitations Info  Sight, Hearing, Speech Sight Info: Adequate Hearing Info: Adequate Speech Info: Adequate    SPECIAL CARE FACTORS FREQUENCY  PT (By licensed PT), OT (By licensed  OT)     PT Frequency: 5x per week OT Frequency: 5x per week            Contractures Contractures Info: Not present    Additional Factors Info  Code Status, Isolation Precautions Code Status Info: full       Isolation Precautions Info: tested positive for covid-19 virus     Current Medications (09/11/2023):  This is the current hospital active medication list Current Facility-Administered Medications  Medication Dose Route Frequency Provider Last Rate Last Admin   acetaminophen  (TYLENOL ) tablet 650 mg  650 mg Oral Q6H PRN Mansy, Jan A, MD   650 mg at 09/10/23 2222   Or   acetaminophen  (TYLENOL ) suppository 650 mg  650 mg Rectal Q6H PRN Mansy, Jan A, MD       atorvastatin  (LIPITOR) tablet 40 mg  40 mg Oral Daily Mansy, Jan A, MD   40 mg at 09/10/23 0920   cyanocobalamin  (VITAMIN B12) tablet 1,000 mcg  1,000 mcg Oral Daily Mansy, Jan A, MD   1,000 mcg at 09/10/23 0920   dextromethorphan -guaiFENesin  (MUCINEX  DM) 30-600 MG per 12 hr tablet 1 tablet  1 tablet Oral BID PRN Dezii, Alexandra, DO       divalproex  (DEPAKOTE ) DR tablet 500 mg  500 mg Oral BID Mansy, Jan A, MD   500 mg at 09/10/23 2222   enoxaparin  (LOVENOX ) injection 40 mg  40 mg Subcutaneous Q24H Mansy, Jan A, MD   40 mg at 09/10/23 9074   feeding supplement (ENSURE ENLIVE / ENSURE PLUS)  liquid 237 mL  237 mL Oral BID BM Dezii, Alexandra, DO       hydrALAZINE  (APRESOLINE ) injection 10 mg  10 mg Intravenous Q6H PRN Mansy, Jan A, MD   10 mg at 09/10/23 0449   influenza vaccine adjuvanted (FLUAD) injection 0.5 mL  0.5 mL Intramuscular Tomorrow-1000 Dezii, Alexandra, DO       ipratropium-albuterol  (DUONEB) 0.5-2.5 (3) MG/3ML nebulizer solution 3 mL  3 mL Nebulization Q4H PRN Dezii, Alexandra, DO   3 mL at 09/10/23 2230   labetalol  (NORMODYNE ) injection 20 mg  20 mg Intravenous Q3H PRN Mansy, Jan A, MD   20 mg at 09/10/23 1157   losartan  (COZAAR ) tablet 100 mg  100 mg Oral Daily Dezii, Alexandra, DO       magnesium  hydroxide  (MILK OF MAGNESIA) suspension 30 mL  30 mL Oral Daily PRN Mansy, Jan A, MD       memantine  (NAMENDA ) tablet 10 mg  10 mg Oral BID Mansy, Jan A, MD   10 mg at 09/10/23 2222   omega-3 acid ethyl esters (LOVAZA ) capsule 1,000 mg  1,000 mg Oral QPM Mansy, Jan A, MD   1,000 mg at 09/10/23 1726   ondansetron  (ZOFRAN ) tablet 4 mg  4 mg Oral Q6H PRN Mansy, Jan A, MD       Or   ondansetron  (ZOFRAN ) injection 4 mg  4 mg Intravenous Q6H PRN Mansy, Jan A, MD       pneumococcal 20-valent conjugate vaccine (PREVNAR 20 ) injection 0.5 mL  0.5 mL Intramuscular Tomorrow-1000 Dezii, Alexandra, DO       traZODone  (DESYREL ) tablet 25 mg  25 mg Oral QHS PRN Mansy, Madison LABOR, MD         Discharge Medications: Please see discharge summary for a list of discharge medications.  Relevant Imaging Results:  Relevant Lab Results:   Additional Information SS# [id number]  Ermalinda Penton [place], [place]

## 2023-09-12 LAB — COMPREHENSIVE METABOLIC PANEL
ALT: 17 U/L (ref 0–44)
AST: 23 U/L (ref 15–41)
Albumin: 2.8 g/dL — ABNORMAL LOW (ref 3.5–5.0)
Alkaline Phosphatase: 39 U/L (ref 38–126)
Anion gap: 10 (ref 5–15)
BUN: 18 mg/dL (ref 8–23)
CO2: 26 mmol/L (ref 22–32)
Calcium: 8.6 mg/dL — ABNORMAL LOW (ref 8.9–10.3)
Chloride: 101 mmol/L (ref 98–111)
Creatinine, Ser: 0.93 mg/dL (ref 0.61–1.24)
GFR, Estimated: >60 mL/min (ref >60)
Glucose, Bld: 82 mg/dL (ref 70–99)
Potassium: 2.8 mmol/L — ABNORMAL LOW (ref 3.5–5.1)
Sodium: 137 mmol/L (ref 135–145)
Total Bilirubin: 0.8 mg/dL (ref 0.0–1.2)
Total Protein: 6.6 g/dL (ref 6.5–8.1)

## 2023-09-12 LAB — MAGNESIUM: Magnesium: 2.5 mg/dL — ABNORMAL HIGH (ref 1.7–2.4)

## 2023-09-12 LAB — CBC WITH DIFFERENTIAL/PLATELET
Abs Immature Granulocytes: 0.02 10*3/uL (ref 0.00–0.07)
Basophils Absolute: 0 10*3/uL (ref 0.0–0.1)
Basophils Relative: 1 %
Eosinophils Absolute: 0.7 10*3/uL — ABNORMAL HIGH (ref 0.0–0.5)
Eosinophils Relative: 8 %
HCT: 41.3 % (ref 39.0–52.0)
Hemoglobin: 14.4 g/dL (ref 13.0–17.0)
Immature Granulocytes: 0 %
Lymphocytes Relative: 15 %
Lymphs Abs: 1.3 10*3/uL (ref 0.7–4.0)
MCH: 34.4 pg — ABNORMAL HIGH (ref 26.0–34.0)
MCHC: 34.9 g/dL (ref 30.0–36.0)
MCV: 98.8 fL (ref 80.0–100.0)
Monocytes Absolute: 1.2 10*3/uL — ABNORMAL HIGH (ref 0.1–1.0)
Monocytes Relative: 15 %
Neutro Abs: 5.2 10*3/uL (ref 1.7–7.7)
Neutrophils Relative %: 61 %
Platelets: 124 10*3/uL — ABNORMAL LOW (ref 150–400)
RBC: 4.18 MIL/uL — ABNORMAL LOW (ref 4.22–5.81)
RDW: 14.4 % (ref 11.5–15.5)
WBC: 8.4 10*3/uL (ref 4.0–10.5)
nRBC: 0 % (ref 0.0–0.2)

## 2023-09-12 LAB — PHOSPHORUS: Phosphorus: 3.3 mg/dL (ref 2.5–4.6)

## 2023-09-12 MED ORDER — CHLORHEXIDINE GLUCONATE CLOTH 2 % EX PADS
6.0000 | MEDICATED_PAD | Freq: Every day | CUTANEOUS | Status: DC
Start: 2023-09-12 — End: 2023-09-18
  Administered 2023-09-12 – 2023-09-18 (×6): 6 via TOPICAL

## 2023-09-12 MED ORDER — POTASSIUM CHLORIDE CRYS ER 20 MEQ PO TBCR
40.0000 meq | EXTENDED_RELEASE_TABLET | ORAL | Status: AC
  Administered 2023-09-12 – 2023-09-13 (×3): 40 meq via ORAL
  Filled 2023-09-12 (×3): qty 2

## 2023-09-12 NOTE — Progress Notes (Signed)
 PROGRESS NOTE    Kurt Bailey  NWG:[id number] DOB: Sep 12, 1945 DOA: 09/09/2023 PCP: Gabriel John, NP  Chief Complaint  Patient presents with   Weakness    Hospital Course:  Kurt Bailey is 78 y.o. male with alcohol abuse, diverticulitis who presents to the ED with cough, chest congestion, dyspnea and myalgias.  Is also been having recurrent falls with generalized weakness per his daughter who brought him to the ER.  In the ED he was found to be tachycardic, blood pressure 153/110, hypochloremic, CK1 17, LDH 189, troponin 50 and repeat 17.  Head CT is unremarkable, C-spine CT revealed severe degenerative changes with multilevel severe osseous neuroforaminal stenosis without acutely displaced fractures or traumatic listhesis.  Patient tested positive for COVID-19  Subjective: On evaluation this morning patient has no acute complaints.  No acute events overnight.   Objective: Vitals:   09/11/23 2108 09/12/23 0353 09/12/23 0752 09/12/23 0754  BP:  110/67 130/76 121/73  Pulse: 69 (!) 54 60 (!) 59  Resp: 18 18 16 16   Temp:  97.7 F (36.5 C) 98.1 F (36.7 C) 98.1 F (36.7 C)  TempSrc:  Oral Oral   SpO2: 92% 94% 95% 95%  Weight:      Height:        Intake/Output Summary (Last 24 hours) at 09/12/2023 1011 Last data filed at 09/12/2023 0600 Gross per 24 hour  Intake 250 ml  Output 670 ml  Net -420 ml   Filed Weights   09/09/23 2114  Weight: 87 kg    Examination: General exam: Appears calm and comfortable, NAD  Respiratory system: No work of breathing, symmetric chest wall expansion, occasional cough, no wheeze Cardiovascular system: S1 & S2 heard, RRR.  Gastrointestinal system: Abdomen is nondistended, soft and nontender.  Neuro: Alert, oriented to self and situation. Extremities: Symmetric, expected ROM Skin: No rashes, lesions Psychiatry: calm, affect and mood appropriate  Assessment & Plan:  Principal Problem:   COVID-19 Active Problems:   Mixed  Alzheimer's and vascular dementia with behavior disturbances (HCC)   Generalized weakness   Dyslipidemia   Vitamin B12 deficiency   COVID    COVID-19 - Chest x-ray unremarkable, no leukocytosis.  Pro-Cal <1, no need for abx - Continue observation - Continue with gentle IV hydration - Follow-up blood cultures - Not hypoxic, no altered mentation.  No indication for steroids, abx or Paxlovid at this time  Ileus - Seen on KUB, distended abdomen -Was made n.p.o., had significant flatulence and BMs.  Has now resumed his diet without issue.  Generalized weakness Frequent falls - Suspect some of this is worsening due to acute illness but some of this may be chronic given his longstanding dementia - PT score: 11 - On discussion with his family it appears he is almost total care at home. - Need significant rehabilitation and assistance.  Consulted TOC for SNF placement - Cont PT/OT  Mixed Alzheimer's and vascular dementia with behavioral disturbance - Continue home meds  Dyslipidemia - Continue statin  Macrocytic anemia B12 deficiency - Most recent B12 levels are elevated.    Elevated blood pressure without diagnosis of hypertension - Losartan  and Amlodipine  now - PRN Hydralazine /labetolol - My be chronic HTN with acute worsening due to COVID  Illiteracy - Cannot read or write, consents must be verbal. -- RN aware   Hypokalemia - replace as needed   DVT prophylaxis: lovenox    Code Status: Full Code Family Communication: None at bedside today..   Disposition: Inpatient.  Patient is significantly weak and debilitated, unable to care for himself at home.  Medically Cleared for discharge.  TOC consulted for SNF placement. Consultants:    Procedures:    Antimicrobials:  Anti-infectives (From admission, onward)    None       Data Reviewed: I have personally reviewed following labs and imaging studies CBC: Recent Labs  Lab 09/09/23 2118 09/10/23 0440  09/11/23 0447 09/12/23 0843  WBC 8.4 5.4 7.8 8.4  NEUTROABS  --   --  5.2 5.2  HGB 16.4 12.2* 14.7 14.4  HCT 48.3 35.9* 43.3 41.3  MCV 102.8* 103.2* 102.1* 98.8  PLT 97* 99* 109* 124*   Basic Metabolic Panel: Recent Labs  Lab 09/09/23 2204 09/10/23 0517 09/11/23 0447  NA 136 138 135  K 4.2 3.8 3.4*  CL 95* 106 104  CO2 25 24 20*  GLUCOSE 121* 104* 99  BUN 14 13 13   CREATININE 1.19 1.00 1.00  CALCIUM  9.2 8.1* 8.2*  MG  --   --  2.3  PHOS  --   --  3.3   GFR: Estimated Creatinine Clearance: 67.4 mL/min (by C-G formula based on SCr of 1 mg/dL). Liver Function Tests: Recent Labs  Lab 09/11/23 0447  AST 24  ALT 16  ALKPHOS 45  BILITOT 1.0  PROT 6.8  ALBUMIN 3.1*   CBG: Recent Labs  Lab 09/10/23 0133 09/10/23 1710  GLUCAP 130* 123*    Recent Results (from the past 240 hours)  Resp panel by RT-PCR (RSV, Flu A&B, Covid) Anterior Nasal Swab     Status: Abnormal   Collection Time: 09/09/23  9:18 PM   Specimen: Anterior Nasal Swab  Result Value Ref Range Status   SARS Coronavirus 2 by RT PCR POSITIVE (A) NEGATIVE Final    Comment: (NOTE) SARS-CoV-2 target nucleic acids are DETECTED.  The SARS-CoV-2 RNA is generally detectable in upper respiratory specimens during the acute phase of infection. Positive results are indicative of the presence of the identified virus, but do not rule out bacterial infection or co-infection with other pathogens not detected by the test. Clinical correlation with patient history and other diagnostic information is necessary to determine patient infection status. The expected result is Negative.  Fact Sheet for Patients: BloggerCourse.com  Fact Sheet for Healthcare Providers: SeriousBroker.it  This test is not yet approved or cleared by the [place]  FDA and  has been authorized for detection and/or diagnosis of SARS-CoV-2 by FDA under an Emergency Use Authorization (EUA).   This EUA will remain in effect (meaning this test can be used) for the duration of  the COVID-19 declaration under Section 564(b)(1) of the A ct, 21 U.S.C. section 360bbb-3(b)(1), unless the authorization is terminated or revoked sooner.     Influenza A by PCR NEGATIVE NEGATIVE Final   Influenza B by PCR NEGATIVE NEGATIVE Final    Comment: (NOTE) The Xpert Xpress SARS-CoV-2/FLU/RSV plus assay is intended as an aid in the diagnosis of influenza from Nasopharyngeal swab specimens and should not be used as a sole basis for treatment. Nasal washings and aspirates are unacceptable for Xpert Xpress SARS-CoV-2/FLU/RSV testing.  Fact Sheet for Patients: BloggerCourse.com  Fact Sheet for Healthcare Providers: SeriousBroker.it  This test is not yet approved or cleared by the [place]  FDA and has been authorized for detection and/or diagnosis of SARS-CoV-2 by FDA under an Emergency Use Authorization (EUA). This EUA will remain in effect (meaning this test can be used) for the duration of the COVID-19 declaration  under Section 564(b)(1) of the Act, 21 U.S.C. section 360bbb-3(b)(1), unless the authorization is terminated or revoked.     Resp Syncytial Virus by PCR NEGATIVE NEGATIVE Final    Comment: (NOTE) Fact Sheet for Patients: BloggerCourse.com  Fact Sheet for Healthcare Providers: SeriousBroker.it  This test is not yet approved or cleared by the [place]  FDA and has been authorized for detection and/or diagnosis of SARS-CoV-2 by FDA under an Emergency Use Authorization (EUA). This EUA will remain in effect (meaning this test can be used) for the duration of the COVID-19 declaration under Section 564(b)(1) of the Act, 21 U.S.C. section 360bbb-3(b)(1), unless the authorization is terminated or revoked.  Performed at [facility], [address]  [place], [place] [address]   Blood Culture (routine x 2)     Status: None (Preliminary result)   Collection Time: 09/10/23 12:43 AM   Specimen: BLOOD LEFT ARM  Result Value Ref Range Status   Specimen Description BLOOD LEFT ARM  Final   Special Requests   Final    BOTTLES DRAWN AEROBIC AND ANAEROBIC Blood Culture adequate volume   Culture   Final    NO GROWTH 2 DAYS Performed at [facility], [address], [place], [place] [address]    Report Status PENDING  Incomplete  Blood Culture (routine x 2)     Status: None (Preliminary result)   Collection Time: 09/10/23 12:43 AM   Specimen: BLOOD LEFT ARM  Result Value Ref Range Status   Specimen Description BLOOD LEFT ARM  Final   Special Requests   Final    BOTTLES DRAWN AEROBIC AND ANAEROBIC Blood Culture results may not be optimal due to an inadequate volume of blood received in culture bottles   Culture   Final    NO GROWTH 2 DAYS Performed at [facility], [address], [place], [place] [address]    Report Status PENDING  Incomplete     Radiology Studies: DG Abd 1 View Result Date: 09/11/2023 CLINICAL DATA:  Abdominal distention. EXAM: ABDOMEN - 1 VIEW COMPARISON:  None Available. FINDINGS: Diffuse gaseous distention of small bowel and colon is seen, consistent with ileus. No radio-opaque calculi or other significant radiographic abnormality are seen. IMPRESSION: Ileus pattern. Electronically Signed   By: Marlyce Sine M.D.   On: 09/11/2023 13:01    Scheduled Meds:  amLODipine   5 mg Oral Daily   atorvastatin   40 mg Oral Daily   Chlorhexidine  Gluconate Cloth  6 each Topical Q0600   cyanocobalamin   1,000 mcg Oral Daily   divalproex   500 mg Oral BID   enoxaparin  (LOVENOX ) injection  40 mg Subcutaneous Q24H   feeding supplement  237 mL Oral BID BM   influenza vaccine adjuvanted  0.5 mL Intramuscular Tomorrow-1000   losartan   100 mg Oral Daily   memantine   10 mg Oral BID   omega-3 acid ethyl esters  1,000 mg Oral  QPM   pneumococcal 20-valent conjugate vaccine  0.5 mL Intramuscular Tomorrow-1000   Continuous Infusions:     LOS: 2 days    Total time spent coordinating care:  Roise Cleaver, DO [facility]  To contact the attending physician between 7A-7P please use Epic Chat. To contact the covering physician during after hours 7P-7A, please review Amion.   09/12/2023, 10:11 AM   *This document has been created with the assistance of dictation software. Please excuse typographical errors. *

## 2023-09-12 NOTE — Progress Notes (Signed)
 Physical Therapy Treatment Patient Details Name: Kurt Bailey MRN: [id number] DOB: May 07, 1946 Today's Date: 09/12/2023   History of Present Illness Pt is a 78 y.o. 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  Pt presented to the emergency room with acute onset of mostly dry cough and chest congestion, mild dyspnea with associated myalgia, fever and chills as well as a headache.  MD assessment includes: Covid-19, general weakness, and recurrent falls.    PT Comments  Pt received in bed, wife at bedside. Pt agreed to PT with focus on functional mobility. Pt required ModA to transfer supine to sit with HOB raised. No dizziness in sitting. ModA to stand without RW, Min/ModA to safely ambulate into bathroom with HHA due to Parkinsonian like gait. Full assist for hygiene standing at [facility] for support. Pt required MinA to ambulate back to bed with RW. Cues to increase step length and floor clearance to reduce fall risk as well as manage AD around objects. SpO2 on RA 98-100% during exertion. Pt currently not at functional baseline, will continue acute PT.    If plan is discharge home, recommend the following: A little help with walking and/or transfers;A little help with bathing/dressing/bathroom;Assistance with cooking/housework;Assistance with feeding;Direct supervision/assist for medications management;Direct supervision/assist for financial management;Assist for transportation;Help with stairs or ramp for entrance;Supervision due to cognitive status   Can travel by private vehicle     No  Equipment Recommendations  Other (comment) (TBD at next level of care)    Recommendations for Other Services       Precautions / Restrictions Precautions Precautions: Fall Restrictions Weight Bearing Restrictions Per Provider Order: No     Mobility  Bed Mobility Overal bed mobility: Needs  Assistance Bed Mobility: Supine to Sit     Supine to sit: Mod assist, HOB elevated          Transfers Overall transfer level: Needs assistance Equipment used: Rolling walker (2 wheels) Transfers: Sit to/from Stand Sit to Stand: Min assist, From elevated surface           General transfer comment: ModA to stand from low toilet with side rail    Ambulation/Gait Ambulation/Gait assistance: Min assist Gait Distance (Feet): 20 Feet Assistive device: Rolling walker (2 wheels) Gait Pattern/deviations: Step-to pattern, Decreased step length - left, Decreased step length - right, Shuffle       General Gait Details: MinA with use of RW due to high fall risk, decreased floor clearance, Parkinsonian shuffle   Stairs             Wheelchair Mobility     Tilt Bed    Modified Rankin (Stroke Patients Only)       Balance Overall balance assessment: Needs assistance Sitting-balance support: Feet supported, Single extremity supported Sitting balance-Leahy Scale: Fair     Standing balance support: Bilateral upper extremity supported, During functional activity, Reliant on assistive device for balance Standing balance-Leahy Scale: Fair Standing balance comment:  (Poor dynamic standing balance without RW)                            Cognition Arousal: Alert Behavior During Therapy: WFL for tasks assessed/performed Overall Cognitive Status: No family/caregiver present to determine baseline cognitive functioning  General Comments: Alert and oriented to person, place situation but not date. Pt requires additional time to follow commands, benefits from multimodal cuing.        Exercises      General Comments General comments (skin integrity, edema, etc.): ABD area remains distended, indwelling catheter in tact.      Pertinent Vitals/Pain Pain Assessment Pain Assessment: No/denies pain    Home Living                           Prior Function            PT Goals (current goals can now be found in the care plan section) Acute Rehab PT Goals Patient Stated Goal: To get stronger Progress towards PT goals: Progressing toward goals    Frequency    Min 1X/week      PT Plan      Co-evaluation              AM-PAC PT "6 Clicks" Mobility   Outcome Measure  Help needed turning from your back to your side while in a flat bed without using bedrails?: A Little Help needed moving from lying on your back to sitting on the side of a flat bed without using bedrails?: A Lot Help needed moving to and from a bed to a chair (including a wheelchair)?: A Lot Help needed standing up from a chair using your arms (e.g., wheelchair or bedside chair)?: A Little Help needed to walk in hospital room?: A Little Help needed climbing 3-5 steps with a railing? : A Lot 6 Click Score: 15    End of Session Equipment Utilized During Treatment: Gait belt Activity Tolerance: Patient tolerated treatment well Patient left: in bed;with call bell/phone within reach;with bed alarm set;with family/visitor present Nurse Communication: Mobility status;Other (comment) (+BM) PT Visit Diagnosis: Unsteadiness on feet (R26.81);History of falling (Z91.81);Difficulty in walking, not elsewhere classified (R26.2);Muscle weakness (generalized) (M62.81)     Time: 1610-9604 PT Time Calculation (min) (ACUTE ONLY): 29 min  Charges:    $Gait Training: 8-22 mins $Therapeutic Activity: 8-22 mins PT General Charges $$ ACUTE PT VISIT: 1 Visit                    Melvyn Stagers, PTA  Diona Franklin 09/12/2023, 4:18 PM

## 2023-09-12 NOTE — Plan of Care (Signed)

## 2023-09-12 NOTE — Progress Notes (Signed)
   09/12/23 1200  Spiritual Encounters  Type of Visit Initial  Care provided to: Patient;Pt not available   Chaplain received a spiritual consult for an Advance Directive. Patient was sleeping when the chaplain arrived. Chaplain services remain available for spiritual and emotional support.

## 2023-09-12 NOTE — Telephone Encounter (Signed)
 Called and spoke with patients wife, she states patient is currently in the hospital with Covid. She stated they were discussing placing him in rehab facility once discharged from hospital. Patients wife stated she will call and schedule f/u appt once patient is discharged.

## 2023-09-12 NOTE — Telephone Encounter (Signed)
 Noted.

## 2023-09-12 NOTE — Evaluation (Signed)
 Clinical/Bedside Swallow Evaluation Patient Details  Name: Kurt Bailey MRN: [id number] Date of Birth: 1946-04-15  Today's Date: 09/12/2023 Time: SLP Start Time (ACUTE ONLY): 1025 SLP Stop Time (ACUTE ONLY): 1045 SLP Time Calculation (min) (ACUTE ONLY): 20 min  Past Medical History:  Past Medical History:  Diagnosis Date   Acute hypoxemic respiratory failure due to COVID-19 (HCC) 05/13/2020   Alcohol abuse    Diverticulitis    Periumbilical hernia    Past Surgical History: History reviewed. No pertinent surgical history. HPI:  Kurt Bailey is a 78 y.o. Caucasian male with medical history significant for alcohol abuse, diverticulitis and sleep.,  Presented to the emergency room with acute onset of mostly dry cough and chest congestion, mild dyspnea with associated myalgia, fever and chills as well as a headache. Pt currently on a regular solids and thin liquids diet on room air. CXR 09/09/23: Low lung volumes. No active disease.    Assessment / Plan / Recommendation  Clinical Impression  Pt seen for bedside swallow assessment in the setting of COVID, with MD indicating potential concern from nursing for trouble with swallow. Nurse reporting pt did well with completion of breakfast and with medication administration this AM. Pt now on room air with O2 saturations maintained at 95 and greater for duration of session. Pt pleasant, distractible, and able to follow simple commands in context with extended time. Assist provided for self feeding. Pt seen with trials of thin liquids (via straw) and regular solids. No overt or subtle s/sx pharyngeal dysphagia noted. No change to vocal quality across trials. Vitals stable for duration of trials. Oral phase grossly intact- with complete manipulation and clearance of regular solid from oral cavity. Mastication mildly prolonged, though suspect that is pt's baseline based on dentition. Pt denied history of PNA, GERD, or dysphagia. Based on age,  cognition, need for assist with meals, and current debility, pt is at increased risk of aspiration, therefore recommend aspiration precautions (slow rate, small bites, elevated HOB, and alert for PO intake). Continue with current unrestricted diet. MD and RN aware of recommendations. SLP will sign off. SLP Visit Diagnosis: Dysphagia, unspecified (R13.10)    Aspiration Risk  Mild aspiration risk    Diet Recommendation   Thin;Age appropriate regular  Medication Administration: Whole meds with liquid (vs with puree)    Other  Recommendations Oral Care Recommendations: Oral care BID    Recommendations for follow up therapy are one component of a multi-disciplinary discharge planning process, led by the attending physician.  Recommendations may be updated based on patient status, additional functional criteria and insurance authorization.  Follow up Recommendations No SLP follow up      Assistance Recommended at Discharge  Supervision/assistance with PO intake  Functional Status Assessment Patient has not had a recent decline in their functional status (re: oropharyngeal swallow)    Swallow Study   General Date of Onset: 09/12/23 HPI: Kurt Bailey is a 78 y.o. Caucasian male with medical history significant for alcohol abuse, diverticulitis and sleep.,  Presented to the emergency room with acute onset of mostly dry cough and chest congestion, mild dyspnea with associated myalgia, fever and chills as well as a headache. Pt currently on a regular solids and thin liquids diet on room air. CXR 09/09/23: Low lung volumes. No active disease. Type of Study: Bedside Swallow Evaluation Previous Swallow Assessment: none in chart Diet Prior to this Study: Regular;Thin liquids (Level 0) Temperature Spikes Noted: Yes (Temp 100.1; WBC 8.4) Respiratory Status: Room  air History of Recent Intubation: No Behavior/Cognition: Alert;Cooperative;Confused Oral Cavity Assessment: Within Functional Limits Oral  Care Completed by SLP: Yes Oral Cavity - Dentition: Dentures, top (no dentures for bottom- pt reports gumming solids without issue) Vision: Functional for self-feeding Self-Feeding Abilities: Needs assist Patient Positioning: Upright in bed Baseline Vocal Quality: Normal Volitional Cough: Strong;Congested Volitional Swallow: Unable to elicit    Oral/Motor/Sensory Function Overall Oral Motor/Sensory Function: Within functional limits   Ice Chips Ice chips: Within functional limits   Thin Liquid Thin Liquid: Within functional limits    Nectar Thick Nectar Thick Liquid: Not tested   Honey Thick Honey Thick Liquid: Not tested   Puree Puree: Not tested   Solid     Solid: Within functional limits Other Comments: minimally prolonged mastication- suspect that is his baseline given dentition     Kurt Georgios Kina Clapp, MS, CCC-SLP Speech Language Pathologist Rehab Services; [facility] -  [phone] (ascom)   Kurt Bailey 09/12/2023,12:46 PM

## 2023-09-13 DIAGNOSIS — U071 COVID-19: Secondary | ICD-10-CM | POA: Diagnosis not present

## 2023-09-13 LAB — PHOSPHORUS: Phosphorus: 3.7 mg/dL (ref 2.5–4.6)

## 2023-09-13 LAB — CBC WITH DIFFERENTIAL/PLATELET
Abs Immature Granulocytes: 0.02 10*3/uL (ref 0.00–0.07)
Basophils Absolute: 0.1 10*3/uL (ref 0.0–0.1)
Basophils Relative: 1 %
Eosinophils Absolute: 1.1 10*3/uL — ABNORMAL HIGH (ref 0.0–0.5)
Eosinophils Relative: 12 %
HCT: 39.8 % (ref 39.0–52.0)
Hemoglobin: 13.9 g/dL (ref 13.0–17.0)
Immature Granulocytes: 0 %
Lymphocytes Relative: 20 %
Lymphs Abs: 1.8 10*3/uL (ref 0.7–4.0)
MCH: 35.1 pg — ABNORMAL HIGH (ref 26.0–34.0)
MCHC: 34.9 g/dL (ref 30.0–36.0)
MCV: 100.5 fL — ABNORMAL HIGH (ref 80.0–100.0)
Monocytes Absolute: 1.4 10*3/uL — ABNORMAL HIGH (ref 0.1–1.0)
Monocytes Relative: 16 %
Neutro Abs: 4.6 10*3/uL (ref 1.7–7.7)
Neutrophils Relative %: 51 %
Platelets: 141 10*3/uL — ABNORMAL LOW (ref 150–400)
RBC: 3.96 MIL/uL — ABNORMAL LOW (ref 4.22–5.81)
RDW: 14.4 % (ref 11.5–15.5)
WBC: 8.9 10*3/uL (ref 4.0–10.5)
nRBC: 0 % (ref 0.0–0.2)

## 2023-09-13 LAB — COMPREHENSIVE METABOLIC PANEL
ALT: 23 U/L (ref 0–44)
AST: 29 U/L (ref 15–41)
Albumin: 2.6 g/dL — ABNORMAL LOW (ref 3.5–5.0)
Alkaline Phosphatase: 47 U/L (ref 38–126)
Anion gap: 7 (ref 5–15)
BUN: 25 mg/dL — ABNORMAL HIGH (ref 8–23)
CO2: 25 mmol/L (ref 22–32)
Calcium: 8.5 mg/dL — ABNORMAL LOW (ref 8.9–10.3)
Chloride: 102 mmol/L (ref 98–111)
Creatinine, Ser: 1.22 mg/dL (ref 0.61–1.24)
GFR, Estimated: >60 mL/min (ref >60)
Glucose, Bld: 85 mg/dL (ref 70–99)
Potassium: 4 mmol/L (ref 3.5–5.1)
Sodium: 134 mmol/L — ABNORMAL LOW (ref 135–145)
Total Bilirubin: 0.5 mg/dL (ref 0.0–1.2)
Total Protein: 6.5 g/dL (ref 6.5–8.1)

## 2023-09-13 LAB — MAGNESIUM: Magnesium: 2.5 mg/dL — ABNORMAL HIGH (ref 1.7–2.4)

## 2023-09-13 MED ORDER — ADULT MULTIVITAMIN W/MINERALS CH
1.0000 | ORAL_TABLET | Freq: Every day | ORAL | Status: DC
  Administered 2023-09-14 – 2023-09-19 (×6): 1 via ORAL
  Filled 2023-09-13 (×6): qty 1

## 2023-09-13 MED ORDER — ENSURE ENLIVE PO LIQD
237.0000 mL | Freq: Three times a day (TID) | ORAL | Status: DC
  Administered 2023-09-13 – 2023-09-18 (×10): 237 mL via ORAL

## 2023-09-13 NOTE — Plan of Care (Signed)
  Problem: Coping: Goal: Psychosocial and spiritual needs will be supported Outcome: Progressing   Problem: Respiratory: Goal: Will maintain a patent airway Outcome: Progressing Goal: Complications related to the disease process, condition or treatment will be avoided or minimized Outcome: Progressing   Problem: Education: Goal: Knowledge of General Education information will improve Description: Including pain rating scale, medication(s)/side effects and non-pharmacologic comfort measures Outcome: Progressing   Problem: Nutrition: Goal: Adequate nutrition will be maintained Outcome: Progressing   Problem: Coping: Goal: Level of anxiety will decrease Outcome: Progressing   Problem: Pain Managment: Goal: General experience of comfort will improve and/or be controlled Outcome: Progressing

## 2023-09-13 NOTE — Progress Notes (Signed)
PROGRESS NOTE    Kurt Bailey  ZOX:[id number] DOB: 1946/01/13 DOA: 09/09/2023 PCP: Doreene Nest, NP  Chief Complaint  Patient presents with   Weakness    Hospital Course:  Kurt Bailey is 78 y.o. male with alcohol abuse, diverticulitis who presents to the ED with cough, chest congestion, dyspnea and myalgias.  Is also been having recurrent falls with generalized weakness per his daughter who brought him to the ER.  In the ED he was found to be tachycardic, blood pressure 153/110, hypochloremic, CK1 17, LDH 189, troponin 50 and repeat 17.  Head CT is unremarkable, C-spine CT revealed severe degenerative changes with multilevel severe osseous neuroforaminal stenosis without acutely displaced fractures or traumatic listhesis.  Patient tested positive for COVID-19 and was admitted.  Subjective: On evaluation this morning patient has no acute complaints.  No acute events overnight.   Objective: Vitals:   09/12/23 1526 09/12/23 2002 09/13/23 0411 09/13/23 0840  BP: 116/79 106/66 100/69 122/74  Pulse: 85 81 69 65  Resp: 16 19 17 17   Temp: 98.2 F (36.8 C) 99.1 F (37.3 C) 98.9 F (37.2 C) 98.7 F (37.1 C)  TempSrc: Oral Oral Oral Oral  SpO2: 97% 92% 95% 95%  Weight:      Height:        Intake/Output Summary (Last 24 hours) at 09/13/2023 1011 Last data filed at 09/12/2023 2009 Gross per 24 hour  Intake --  Output 375 ml  Net -375 ml   Filed Weights   09/09/23 2114  Weight: 87 kg    Examination: General exam: Appears calm and comfortable, NAD  Respiratory system: No work of breathing, symmetric chest wall expansion, occasional cough, no wheeze Cardiovascular system: S1 & S2 heard, RRR.  Gastrointestinal system: Abdomen is nondistended, soft and nontender.  Neuro: Alert, oriented to self and situation. 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  Pro-Cal <1, no need for abx - Continue observation - Continue with gentle IV hydration - Follow-up blood cultures - Not hypoxic, no altered mentation.  No indication for steroids, abx or Paxlovid at this time  Ileus - Seen on KUB, distended abdomen -Was made n.p.o., had significant flatulence and BMs.  Has now resumed his diet without issue.  Generalized weakness Frequent falls - Suspect some of this is worsening due to acute illness but some of this may be chronic given his longstanding dementia - PT score: 11 - On discussion with his family it appears he is almost total care at home. - Need significant rehabilitation and assistance.  Consulted TOC for SNF placement - Cont PT/OT  Mixed Alzheimer's and vascular dementia with behavioral disturbance - Continue home meds  Dyslipidemia - Continue statin  Macrocytic anemia B12 deficiency - Most recent B12 levels are elevated.    Elevated blood pressure without diagnosis of hypertension - Losartan and Amlodipine now - PRN Hydralazine/labetolol - My be chronic HTN with acute worsening due to COVID  Illiteracy - Cannot read or write, consents must be verbal. -- RN aware   Hypokalemia - replace as needed   DVT prophylaxis: lovenox   Code Status: Full Code Family Communication: None at bedside today.Marland Kitchen  Disposition: Inpatient.  Patient is significantly weak and debilitated, unable to care for himself at home.  Medically Cleared for discharge.  TOC consulted for SNF placement. Consultants:    Procedures:    Antimicrobials:  Anti-infectives (From admission, onward)    None       Data Reviewed: I have personally reviewed following labs and imaging studies CBC: Recent Labs  Lab  09/09/23 2118 09/10/23 0440 09/11/23 0447 09/12/23 0843 09/13/23 0639  WBC 8.4 5.4 7.8 8.4 8.9  NEUTROABS  --   --  5.2 5.2 4.6  HGB 16.4 12.2* 14.7 14.4 13.9  HCT 48.3 35.9* 43.3 41.3 39.8  MCV 102.8* 103.2* 102.1* 98.8 100.5*  PLT 97* 99* 109* 124* 141*   Basic Metabolic Panel: Recent Labs  Lab 09/09/23 2204 09/10/23 0517 09/11/23 0447 09/12/23 0843 09/13/23 0639  NA 136 138 135 137 134*  K 4.2 3.8 3.4* 2.8* 4.0  CL 95* 106 104 101 102  CO2 25 24 20* 26 25  GLUCOSE 121* 104* 99 82 85  BUN 14 13 13 18  25*  CREATININE 1.19 1.00 1.00 0.93 1.22  CALCIUM 9.2 8.1* 8.2* 8.6* 8.5*  MG  --   --  2.3 2.5* 2.5*  PHOS  --   --  3.3 3.3 3.7   GFR: Estimated Creatinine Clearance: 55.2 mL/min (by C-G formula based on SCr of 1.22 mg/dL). Liver Function Tests: Recent Labs  Lab 09/11/23 0447 09/12/23 0843 09/13/23 0639  AST 24 23 29   ALT 16 17 23   ALKPHOS 45 39 47  BILITOT 1.0 0.8 0.5  PROT 6.8 6.6 6.5  ALBUMIN 3.1* 2.8* 2.6*   CBG: Recent Labs  Lab 09/10/23 0133 09/10/23 1710  GLUCAP 130* 123*    Recent Results (from the past 240 hours)  Resp panel by RT-PCR (RSV, Flu A&B, Covid) Anterior Nasal Swab     Status: Abnormal   Collection Time: 09/09/23  9:18 PM   Specimen: Anterior Nasal Swab  Result Value Ref Range Status   SARS Coronavirus 2 by RT PCR POSITIVE (A) NEGATIVE Final    Comment: (NOTE) SARS-CoV-2 target nucleic acids are DETECTED.  The SARS-CoV-2 RNA is generally detectable in upper respiratory specimens during the acute phase of infection. Positive results are indicative of the presence of the identified virus, but do not rule out bacterial infection or co-infection with other pathogens not detected by the test. Clinical correlation with patient history and other diagnostic information is necessary to determine patient infection status. The expected result is Negative.  Fact Sheet for Patients: BloggerCourse.com  Fact Sheet  for Healthcare Providers: SeriousBroker.it  This test is not yet approved or cleared by the [place] FDA and  has been authorized for detection and/or diagnosis of SARS-CoV-2 by FDA under an Emergency Use Authorization (EUA).  This EUA will remain in effect (meaning this test can be used) for the duration of  the COVID-19 declaration under Section 564(b)(1) of the A ct, 21 U.S.C. section 360bbb-3(b)(1), unless the authorization is terminated or revoked sooner.     Influenza A by PCR NEGATIVE NEGATIVE Final   Influenza B by PCR NEGATIVE NEGATIVE Final    Comment: (NOTE) The Xpert Xpress SARS-CoV-2/FLU/RSV plus assay is intended as an aid in the diagnosis of influenza from Nasopharyngeal swab specimens and should not be used as a sole basis for treatment. Nasal washings and aspirates are unacceptable for Xpert Xpress SARS-CoV-2/FLU/RSV testing.  Fact Sheet for Patients: BloggerCourse.com  Fact Sheet for Healthcare Providers: SeriousBroker.it  This test is not yet approved or cleared by the [place] and has been authorized for detection and/or diagnosis of SARS-CoV-2 by FDA under an Emergency Use Authorization (EUA). This EUA will remain in effect (meaning this test can be used) for the duration of the COVID-19 declaration under Section 564(b)(1) of the Act, 21 U.S.C. section 360bbb-3(b)(1), unless the authorization is terminated or revoked.     Resp Syncytial Virus by PCR NEGATIVE NEGATIVE Final    Comment: (NOTE) Fact Sheet for Patients: BloggerCourse.com  Fact Sheet for Healthcare Providers: SeriousBroker.it  This test is not yet approved or cleared by the [place] FDA and has been authorized for detection and/or diagnosis of SARS-CoV-2 by FDA under an Emergency Use Authorization (EUA). This EUA will remain in effect (meaning this  test can be used) for the duration of the COVID-19 declaration under Section 564(b)(1) of the Act, 21 U.S.C. section 360bbb-3(b)(1), unless the authorization is terminated or revoked.  Performed at [facility], [address] [place], [place] [address]   Blood Culture (routine x 2)     Status: None (Preliminary result)   Collection Time: 09/10/23 12:43 AM   Specimen: BLOOD LEFT ARM  Result Value Ref Range Status   Specimen Description BLOOD LEFT ARM  Final   Special Requests   Final    BOTTLES DRAWN AEROBIC AND ANAEROBIC Blood Culture adequate volume   Culture   Final    NO GROWTH 3 DAYS Performed at [facility], [address] [place], [place] [address]    Report Status PENDING  Incomplete  Blood Culture (routine x 2)     Status: None (Preliminary result)   Collection Time: 09/10/23 12:43 AM   Specimen: BLOOD LEFT ARM  Result Value Ref Range Status   Specimen Description BLOOD LEFT ARM  Final   Special Requests   Final    BOTTLES DRAWN AEROBIC AND ANAEROBIC Blood Culture results may not be optimal due to an inadequate volume of blood received in culture bottles   Culture   Final    NO GROWTH 3 DAYS Performed at [facility], [address], [place], [place] [address]    Report Status PENDING  Incomplete     Radiology Studies: DG Abd 1 View Result Date: 09/11/2023 CLINICAL DATA:  Abdominal distention. EXAM: ABDOMEN - 1 VIEW COMPARISON:  None Available. FINDINGS: Diffuse gaseous distention of small bowel and colon is seen, consistent with ileus. No radio-opaque calculi or other significant radiographic abnormality are seen. IMPRESSION: Ileus pattern. Electronically Signed   By: Danae Orleans M.D.   On: 09/11/2023 13:01    Scheduled Meds:  amLODipine  5 mg Oral Daily   atorvastatin  40 mg Oral Daily   Chlorhexidine Gluconate Cloth  6 each Topical Q0600   cyanocobalamin  1,000 mcg Oral Daily   divalproex  500 mg Oral BID   enoxaparin (LOVENOX)  injection  40 mg Subcutaneous Q24H   feeding supplement  237 mL Oral BID BM   losartan  100 mg Oral Daily   memantine  10 mg Oral BID   omega-3 acid ethyl esters  1,000 mg Oral QPM   Continuous Infusions:     LOS: 3 days    Total time spent coordinating care:  Debarah Crape, DO [facility]  To contact the attending physician between 7A-7P please use Epic Chat. To contact the covering physician during after hours 7P-7A, please review Amion.   09/13/2023, 10:11 AM   *  This document has been created with the assistance of dictation software. Please excuse typographical errors. *

## 2023-09-13 NOTE — TOC PASRR Note (Signed)
RE: Kurt Bailey Date of Birth: May 24, 1946 Date: 09/13/2023   To Whom It May Concern:  Please be advised that the above-named patient will require a short-term nursing home stay - anticipated 30 days or less for rehabilitation and strengthening.  The plan is for return home.

## 2023-09-13 NOTE — Progress Notes (Signed)
Initial Nutrition Assessment  DOCUMENTATION CODES:   Not applicable  INTERVENTION:   Ensure Enlive po TID, each supplement provides 350 kcal and 20 grams of protein.   Magic cup TID with meals, each supplement provides 290 kcal and 9 grams of protein  MVI po daily   Mechanical soft diet   Pt at high refeed risk; recommend monitor potassium, magnesium and phosphorus labs daily until stable  Daily weights   NUTRITION DIAGNOSIS:   Increased nutrient needs related to acute illness as evidenced by estimated needs.  GOAL:   Patient will meet greater than or equal to 90% of their needs  MONITOR:   PO intake, Supplement acceptance, Labs, Weight trends, I & O's, Skin  REASON FOR ASSESSMENT:   Malnutrition Screening Tool    ASSESSMENT:   78 y/o male with h/o dementia, etoh abuse, HLD, B12 deficiency and umbilical hernia who is admitted with COVID 19 and possible ileus.  Met with pt and family in room today. Pt is a poor historian so majority of history obtained from family at bedside. Pt reports that he is feeling better today. Pt sitting up in bed eating dinner at time of RD visit. Family reports pt with poor oral intake for several days pta. Family reports that pt has been eating only bites of meals in hospital but patient eating fairly well while RD at bedside. Pt documented to be eating <25% of meals. Pt has been drinking some Ensure. RD will add Magic Cups to meal trays. RD discussed with pt the importance of adequate nutrition needed to preserve lean muscle. Pt is likely at refeed risk. Family reports pt had a large BM today; ileus seems resolved.   Per chart, pt is down 11lbs(5%) over the past year; this is not significant.    Medications reviewed and include: B12, lovenox, lovaza  Labs reviewed: Na 134(L), K 4.0 wnl, P 3.7 wnl, Mg 2.5(H)  NUTRITION - FOCUSED PHYSICAL EXAM:  Flowsheet Row Most Recent Value  Orbital Region No depletion  Upper Arm Region Moderate  depletion  Thoracic and Lumbar Region No depletion  Buccal Region No depletion  Temple Region No depletion  Clavicle Bone Region No depletion  Clavicle and Acromion Bone Region No depletion  Scapular Bone Region No depletion  Dorsal Hand No depletion  Patellar Region No depletion  Anterior Thigh Region No depletion  Posterior Calf Region No depletion  Edema (RD Assessment) Mild  Hair Reviewed  Eyes Reviewed  Mouth Reviewed  Skin Reviewed  Nails Reviewed   Diet Order:   Diet Order             DIET DYS 3 Room service appropriate? Yes; Fluid consistency: Thin  Diet effective now           Diet general                  EDUCATION NEEDS:   Education needs have been addressed  Skin:  Skin Assessment: Reviewed RN Assessment (ecchymosis)  Last BM:  2/11  Height:   Ht Readings from Last 1 Encounters:  09/09/23 5\' 8"  (1.727 m)    Weight:   Wt Readings from Last 1 Encounters:  09/09/23 87 kg    Ideal Body Weight:  70 kg  BMI:  Body mass index is 29.16 kg/m.  Estimated Nutritional Needs:   Kcal:  1900-2200kcal/day  Protein:  95-110g/day  Fluid:  1.8-2.1L/day  Betsey Holiday MS, RD, LDN If unable to be reached, please send secure chat  to "RD inpatient" available from 8:00a-4:00p daily

## 2023-09-13 NOTE — Progress Notes (Signed)
 Occupational Therapy Treatment Patient Details Name: Kurt Bailey MRN: [id number] DOB: February 13, 1946 Today's Date: 09/13/2023   History of present illness Pt is a 78 y.o. 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  Pt presented to the emergency room with acute onset of mostly dry cough and chest congestion, mild dyspnea with associated myalgia, fever and chills as well as a headache.  MD assessment includes: Covid-19, general weakness, and recurrent falls.   OT comments  Kurt Bailey was seen for OT treatment on this date. Upon arrival to room pt sleeping soundly in bed, wakes to VCs, agreeable to OT Tx session. OT facilitated ADL management with education and assist as described below. See ADL section for additional details regarding occupational performance. Pt continues to be functionally limited by decreased cognition, generalized weakness, limited balance, and decreased activity tolerance. Pt is progressing toward OT goals and continues to benefit from skilled OT services to maximize return to PLOF and minimize risk of future falls, injury, caregiver burden, and readmission. Will continue to follow POC as written. Discharge recommendation remains appropriate.        If plan is discharge home, recommend the following:  A lot of help with bathing/dressing/bathroom;Assistance with feeding;Assistance with cooking/housework;Direct supervision/assist for medications management;Direct supervision/assist for financial management;Assist for transportation;Help with stairs or ramp for entrance;Supervision due to cognitive status;A lot of help with walking and/or transfers   Equipment Recommendations  Other (comment) (defer to next venue of care)    Recommendations for Other Services      Precautions / Restrictions Precautions Precautions: Fall Restrictions Weight Bearing Restrictions Per  Provider Order: No       Mobility Bed Mobility Overal bed mobility: Needs Assistance Bed Mobility: Supine to Sit, Sit to Supine     Supine to sit: Min assist, HOB elevated Sit to supine: Supervision, HOB elevated, Used rails        Transfers Overall transfer level: Needs assistance Equipment used: Rolling walker (2 wheels) Transfers: Sit to/from Stand Sit to Stand: Contact guard assist           General transfer comment: CGA for safety to perform functional transfer in room. Pt tends to push RW far out in front of himself, improved stability and balance with cueing for proper RW use.     Balance Overall balance assessment: Needs assistance Sitting-balance support: Feet supported, Single extremity supported Sitting balance-Kurt Bailey Scale: Fair     Standing balance support: Bilateral upper extremity supported, During functional activity, Reliant on assistive device for balance Standing balance-Kurt Bailey Scale: Fair                             ADL either performed or assessed with clinical judgement   ADL Overall ADL's : Needs assistance/impaired Eating/Feeding: Sitting;Set up;Supervision/ safety;Cueing for sequencing Eating/Feeding Details (indicate cue type and reason): Able to perform self-feeding with cueing to initiate. Per spouse, requires assistance to cut meal tray items. Dietition in during session and discussed ordering mechanically chopped diet to maximize pt independence and food intake. Grooming: Sitting;Set up;Supervision/safety;Wash/dry face Grooming Details (indicate cue type and reason): Steady static sitting during functional activity this date.                 Toilet Transfer: Contact guard assist;Regular Toilet;Rolling walker (2 wheels);Ambulation;Cueing for safety;Cueing for sequencing Toilet Transfer Details (indicate cue type and reason): Educated on safe RW use during  functional transfers. Requires consistent cueing for safety/sequencing  t/o activity. Toileting- Clothing Manipulation and Hygiene: Moderate assistance;Sit to/from stand;Cueing for safety;Cueing for sequencing Toileting - Clothing Manipulation Details (indicate cue type and reason): STS with RW, MOD A for thoroughness.     Functional mobility during ADLs: Cueing for safety;Cueing for sequencing;Contact guard assist;Rolling walker (2 wheels)      Extremity/Trunk Assessment              Vision Patient Visual Report: No change from baseline     Perception     Praxis     Communication Communication Communication: No apparent difficulties   Cognition Arousal: Alert Behavior During Therapy: WFL for tasks assessed/performed Cognition: History of cognitive impairments             OT - Cognition Comments: Per family, pt clearer today, able to follow VCs with cueing. Closer to baseline as compared to previous date.                 Following commands: Impaired Following commands impaired: Follows one step commands with increased time, Follows multi-step commands inconsistently      Cueing   Cueing Techniques: Verbal cues, Tactile cues, Visual cues  Exercises Other Exercises Other Exercises: Pt/caregivers educated on role of OT in acute setting, safety, falls prevention strategies, and safe use of AE/DME for ADL management. OT facilitated ADL management with education and assist as described above.    Shoulder Instructions       General Comments      Pertinent Vitals/ Pain       Pain Assessment Pain Assessment: No/denies pain  Home Living                                          Prior Functioning/Environment              Frequency  Min 1X/week        Progress Toward Goals  OT Goals(current goals can now be found in the care plan section)  Progress towards OT goals: Progressing toward goals  Acute Rehab OT Goals OT Goal Formulation: Patient unable to participate in goal setting Time For Goal  Achievement: 09/25/23 Potential to Achieve Goals: Fair  Plan      Co-evaluation                 AM-PAC OT "6 Clicks" Daily Activity     Outcome Measure   Help from another person eating meals?: A Little Help from another person taking care of personal grooming?: A Little Help from another person toileting, which includes using toliet, bedpan, or urinal?: A Lot Help from another person bathing (including washing, rinsing, drying)?: A Lot Help from another person to put on and taking off regular upper body clothing?: A Little Help from another person to put on and taking off regular lower body clothing?: A Lot 6 Click Score: 15    End of Session Equipment Utilized During Treatment: Gait belt;Rolling walker (2 wheels)  OT Visit Diagnosis: Repeated falls (R29.6);Muscle weakness (generalized) (M62.81);Unsteadiness on feet (R26.81)   Activity Tolerance Patient tolerated treatment well   Patient Left in bed;with call bell/phone within reach;with bed alarm set;with family/visitor present;with nursing/sitter in room   Nurse Communication Mobility status        Time: 1350-1420 OT Time Calculation (min): 30 min  Charges: OT General Charges $OT Visit: 1 Visit  OT Treatments $Self Care/Home Management : 23-37 mins  Rockney Ghee, M.S., OTR/L 09/13/23, 3:45 PM

## 2023-09-13 NOTE — TOC Progression Note (Addendum)
Transition of Care [facility]) - Progression Note    Patient Details  Name: Kurt Bailey MRN: [id number] Date of Birth: 25-Nov-1945  Transition of Care [facility]) CM/SW Contact  Margarito Liner, LCSW Phone Number: 09/13/2023, 3:20 PM  Clinical Narrative:  [facility]. CSW sent referral to [facility] this morning and asked them to review. All other SNF's in [facility] have declined.   3:30 pm: CSW uploaded the 30-day note into [facility] for [facility] review.  Expected Discharge Plan: Skilled Nursing Facility Barriers to Discharge: Continued Medical Work up  Expected Discharge Plan and Services In-house Referral: Clinical Social Work   Post Acute Care Choice: Skilled Nursing Facility Living arrangements for the past 2 months: Single Family Home Expected Discharge Date: 09/10/23                                     Social Determinants of Health (SDOH) Interventions SDOH Screenings   Food Insecurity: No Food Insecurity (09/10/2023)  Housing: Low Risk  (09/10/2023)  Transportation Needs: No Transportation Needs (09/10/2023)  Utilities: Not At Risk (09/10/2023)  Alcohol Screen: Low Risk  (09/07/2023)  Depression (PHQ2-9): Low Risk  (09/07/2023)  Financial Resource Strain: Low Risk  (09/07/2023)  Physical Activity: Inactive (09/07/2023)  Social Connections: Unknown (09/10/2023)  Recent Concern: Social Connections - Socially Isolated (09/07/2023)  Stress: No Stress Concern Present (09/07/2023)  Tobacco Use: Low Risk  (09/09/2023)  Health Literacy: Adequate Health Literacy (09/07/2023)    Readmission Risk Interventions     No data to display

## 2023-09-14 DIAGNOSIS — R338 Other retention of urine: Secondary | ICD-10-CM

## 2023-09-14 DIAGNOSIS — E538 Deficiency of other specified B group vitamins: Secondary | ICD-10-CM

## 2023-09-14 DIAGNOSIS — E663 Overweight: Secondary | ICD-10-CM | POA: Insufficient documentation

## 2023-09-14 DIAGNOSIS — U071 COVID-19: Secondary | ICD-10-CM | POA: Diagnosis not present

## 2023-09-14 DIAGNOSIS — E785 Hyperlipidemia, unspecified: Secondary | ICD-10-CM | POA: Diagnosis not present

## 2023-09-14 DIAGNOSIS — G309 Alzheimer's disease, unspecified: Secondary | ICD-10-CM | POA: Diagnosis not present

## 2023-09-14 HISTORY — DX: Other retention of urine: R33.8

## 2023-09-14 LAB — CBC WITH DIFFERENTIAL/PLATELET
Abs Immature Granulocytes: 0.04 10*3/uL (ref 0.00–0.07)
Basophils Absolute: 0.1 10*3/uL (ref 0.0–0.1)
Basophils Relative: 1 %
Eosinophils Absolute: 1.4 10*3/uL — ABNORMAL HIGH (ref 0.0–0.5)
Eosinophils Relative: 17 %
HCT: 38.8 % — ABNORMAL LOW (ref 39.0–52.0)
Hemoglobin: 13.6 g/dL (ref 13.0–17.0)
Immature Granulocytes: 1 %
Lymphocytes Relative: 25 %
Lymphs Abs: 2.1 10*3/uL (ref 0.7–4.0)
MCH: 34.8 pg — ABNORMAL HIGH (ref 26.0–34.0)
MCHC: 35.1 g/dL (ref 30.0–36.0)
MCV: 99.2 fL (ref 80.0–100.0)
Monocytes Absolute: 1.2 10*3/uL — ABNORMAL HIGH (ref 0.1–1.0)
Monocytes Relative: 15 %
Neutro Abs: 3.3 10*3/uL (ref 1.7–7.7)
Neutrophils Relative %: 41 %
Platelets: 159 10*3/uL (ref 150–400)
RBC: 3.91 MIL/uL — ABNORMAL LOW (ref 4.22–5.81)
RDW: 14.6 % (ref 11.5–15.5)
WBC: 8.1 10*3/uL (ref 4.0–10.5)
nRBC: 0 % (ref 0.0–0.2)

## 2023-09-14 LAB — COMPREHENSIVE METABOLIC PANEL
ALT: 26 U/L (ref 0–44)
AST: 31 U/L (ref 15–41)
Albumin: 2.5 g/dL — ABNORMAL LOW (ref 3.5–5.0)
Alkaline Phosphatase: 51 U/L (ref 38–126)
Anion gap: 8 (ref 5–15)
BUN: 24 mg/dL — ABNORMAL HIGH (ref 8–23)
CO2: 26 mmol/L (ref 22–32)
Calcium: 8.7 mg/dL — ABNORMAL LOW (ref 8.9–10.3)
Chloride: 103 mmol/L (ref 98–111)
Creatinine, Ser: 1.08 mg/dL (ref 0.61–1.24)
GFR, Estimated: >60 mL/min (ref >60)
Glucose, Bld: 88 mg/dL (ref 70–99)
Potassium: 3.9 mmol/L (ref 3.5–5.1)
Sodium: 137 mmol/L (ref 135–145)
Total Bilirubin: 0.4 mg/dL (ref 0.0–1.2)
Total Protein: 6.4 g/dL — ABNORMAL LOW (ref 6.5–8.1)

## 2023-09-14 LAB — MAGNESIUM: Magnesium: 2.4 mg/dL (ref 1.7–2.4)

## 2023-09-14 LAB — PHOSPHORUS: Phosphorus: 4.4 mg/dL (ref 2.5–4.6)

## 2023-09-14 MED ORDER — TAMSULOSIN HCL 0.4 MG PO CAPS
0.4000 mg | ORAL_CAPSULE | Freq: Every day | ORAL | Status: DC
  Administered 2023-09-14 – 2023-09-18 (×6): 0.4 mg via ORAL
  Filled 2023-09-14 (×6): qty 1

## 2023-09-14 NOTE — Plan of Care (Signed)
  Problem: Coping: Goal: Psychosocial and spiritual needs will be supported Outcome: Progressing   Problem: Respiratory: Goal: Will maintain a patent airway Outcome: Progressing Goal: Complications related to the disease process, condition or treatment will be avoided or minimized Outcome: Progressing   Problem: Activity: Goal: Risk for activity intolerance will decrease Outcome: Progressing   Problem: Nutrition: Goal: Adequate nutrition will be maintained Outcome: Progressing   Problem: Coping: Goal: Level of anxiety will decrease Outcome: Progressing   Problem: Elimination: Goal: Will not experience complications related to bowel motility Outcome: Progressing Goal: Will not experience complications related to urinary retention Outcome: Progressing   Problem: Pain Managment: Goal: General experience of comfort will improve and/or be controlled Outcome: Progressing   Problem: Safety: Goal: Ability to remain free from injury will improve Outcome: Progressing   Problem: Skin Integrity: Goal: Risk for impaired skin integrity will decrease Outcome: Progressing

## 2023-09-14 NOTE — Progress Notes (Signed)
Physical Therapy Treatment Patient Details Name: Kurt Bailey MRN: [id number] DOB: 12/19/1945 Today's Date: 09/14/2023   History of Present Illness Pt is a 78 y.o. 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  Pt presented to the emergency room with acute onset of mostly dry cough and chest congestion, mild dyspnea with associated myalgia, fever and chills as well as a headache.  MD assessment includes: Covid-19, general weakness, and recurrent falls.    PT Comments  Pt was long sitting in bed upon arrival. He is alert but has baseline cognition deficits that were confirmed with daughter pot session. Pt's daughter endorse frequent falls at home and reports pt unwillingness to use AD when home. Pt did use RW during this session and still remains high fall risk. Pt was able to exit bed, stand, an tolerate ambulate with RW ~ 15 ft. Poor gait safety overall. Pt endorses feeling "woozy" however all vitals were stable. Acute PT will continue to follow and progress per current POC. DC recs remain appropriate to maximize pt's safety and independence with all ADLs.     If plan is discharge home, recommend the following: A little help with walking and/or transfers;A little help with bathing/dressing/bathroom;Assistance with cooking/housework;Assistance with feeding;Direct supervision/assist for medications management;Direct supervision/assist for financial management;Assist for transportation;Help with stairs or ramp for entrance;Supervision due to cognitive status   Can travel by private vehicle     No  Equipment Recommendations  Other (comment) (Defer to next level of care.)       Precautions / Restrictions Precautions Precautions: Fall Restrictions Weight Bearing Restrictions Per Provider Order: No     Mobility  Bed Mobility Overal bed mobility: Needs Assistance Bed Mobility: Supine to  Sit  Supine to sit: Min assist  General bed mobility comments: Min assist for safety with vcs for imroved sequencing and safety. pt present with posterior lean in sitting but no physical assistance required to maintain sitting balance. vcs for fwd wt shift    Transfers Overall transfer level: Needs assistance Equipment used: Rolling walker (2 wheels) Transfers: Sit to/from Stand Sit to Stand: Contact guard assist, Min assist  General transfer comment: CGA from slightly elevated bed height. min assist from lowest    Ambulation/Gait Ambulation/Gait assistance: Min assist Gait Distance (Feet): 15 Feet Assistive device: Rolling walker (2 wheels) Gait Pattern/deviations: Step-to pattern, Decreased step length - left, Decreased step length - right, Shuffle Gait velocity: decreased  General Gait Details: pt has poor gait quality and poor balance in standing/ambulating. Per daughter, " He refuses to use RW at home even when encouraged to."     Balance Overall balance assessment: Needs assistance Sitting-balance support: Feet supported, Single extremity supported Sitting balance-Leahy Scale: Fair Sitting balance - Comments: Posterior LOB in sitting. No intervention but vcs throughout for fwd wt shift   Standing balance support: Bilateral upper extremity supported, During functional activity, Reliant on assistive device for balance Standing balance-Leahy Scale: Fair Standing balance comment: pt has had frequent recent falls at home     Communication Communication Communication: No apparent difficulties  Cognition Arousal: Alert Behavior During Therapy: Flat affect   PT - Cognitive impairments: History of cognitive impairments (confirmed with daughter baseline cognition. seems to be at or close to baseline. Does have dementia)    PT - Cognition Comments: Pt is A but has baseline cognition deficits. poor awareness of sitution and overall safety . pt remains high fall risk Following  commands: Impaired Following commands impaired: Follows one step commands with increased time, Follows multi-step commands inconsistently    Cueing Cueing Techniques: Verbal cues, Tactile cues         Pertinent Vitals/Pain Pain Assessment Pain Assessment: No/denies pain     PT Goals (current goals can now be found in the care plan section) Acute Rehab PT Goals Patient Stated Goal: none stated Progress towards PT goals: Not progressing toward goals - comment    Frequency    Min 1X/week       AM-PAC PT "6 Clicks" Mobility   Outcome Measure  Help needed turning from your back to your side while in a flat bed without using bedrails?: A Little Help needed moving from lying on your back to sitting on the side of a flat bed without using bedrails?: A Lot Help needed moving to and from a bed to a chair (including a wheelchair)?: A Lot Help needed standing up from a chair using your arms (e.g., wheelchair or bedside chair)?: A Lot Help needed to walk in hospital room?: A Lot Help needed climbing 3-5 steps with a railing? : A Lot 6 Click Score: 13    End of Session   Activity Tolerance: Patient tolerated treatment well Patient left: in chair;with call bell/phone within reach;with chair alarm set Nurse Communication: Mobility status PT Visit Diagnosis: Unsteadiness on feet (R26.81);History of falling (Z91.81);Difficulty in walking, not elsewhere classified (R26.2);Muscle weakness (generalized) (M62.81)     Time: 0102-7253 PT Time Calculation (min) (ACUTE ONLY): 21 min  Charges:    $Gait Training: 8-22 mins PT General Charges $$ ACUTE PT VISIT: 1 Visit                     Jetta Lout PTA 09/14/23, 11:24 AM

## 2023-09-14 NOTE — Assessment & Plan Note (Addendum)
 BMI 29.13

## 2023-09-14 NOTE — Progress Notes (Signed)
Progress Note   Patient: Kurt Bailey DOB: Jan 24, 1946 DOA: 09/09/2023     4 DOS: the patient was seen and examined on 09/14/2023   Brief hospital course: Kurt Bailey is 78 y.o. male with alcohol abuse, diverticulitis who presents to the ED with cough, chest congestion, dyspnea and myalgias.  Is also been having recurrent falls with generalized weakness per his daughter who brought him to the ER.  In the ED he was found to mildly tachycardic and hypertensive.  Labs mostly within normal limits.  Head CT is unremarkable, C-spine CT revealed severe degenerative changes with multilevel severe osseous neuroforaminal stenosis without acutely displaced fractures or traumatic listhesis.  Patient tested positive for COVID-19.  By reevaluation on 2/8 patient reported feeling weak but was overall all stable.  2/12.  Physical therapy recommending rehab.  Rehab facility will not take until 2/18 (will need to complete COVID isolation).  Assessment and Plan: * COVID-19 Supportive care.  Will need 10 days of isolation here in the hospital before going out to rehab.   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  Patient's mental status better than previous.  Acute urinary retention Foley catheter placed on the ninth.  Will start Flomax.  Will likely do a voiding trial in 5 days.  Dyslipidemia Continue atorvastatin  Generalized weakness Physical therapy recommending rehab.  Overweight (BMI 25.0-29.9) BMI 29.16  Vitamin B12 deficiency Continue vitamin B12.        Subjective: Patient feels okay.  Offers no complaints.  Not the best historian secondary to dementia.  Admitted with weakness and found to have COVID infection.  Physical Exam: Vitals:   09/13/23 0840 09/13/23 1544 09/13/23 2151 09/14/23 0816  BP: 122/74 108/68 115/78 115/74  Pulse: 65 65 73 70  Resp: 17 17 19 20   Temp: 98.7 F (37.1 C) 98.9 F  (37.2 C) 98 F (36.7 C) 98 F (36.7 C)  TempSrc: Oral Oral Oral Oral  SpO2: 95% 95% 96% 94%  Weight:      Height:       Physical Exam HENT:     Head: Normocephalic.     Mouth/Throat:     Pharynx: No oropharyngeal exudate.  Eyes:     General: Lids are normal.     Conjunctiva/sclera: Conjunctivae normal.  Cardiovascular:     Rate and Rhythm: Normal rate and regular rhythm.     Heart sounds: Normal heart sounds, S1 normal and S2 normal.  Pulmonary:     Breath sounds: Examination of the right-lower field reveals decreased breath sounds. Examination of the left-lower field reveals decreased breath sounds. Decreased breath sounds present. No wheezing, rhonchi or rales.  Abdominal:     Palpations: Abdomen is soft.     Tenderness: There is no abdominal tenderness.  Musculoskeletal:     Right lower leg: No swelling.     Left lower leg: No swelling.  Skin:    General: Skin is warm.     Findings: No rash.  Neurological:     Mental Status: He is alert.     Comments: Patient answer some simple questions.     Data Reviewed: COVID diagnosed on 09/09/2023. Sodium 137, creatinine 1.086, hemoglobin 13.6, white blood cell count 8.1, platelet count 159  Family Communication: Updated daughter on the phone  Disposition: Status is: Inpatient Remains inpatient appropriate because: Needs 10-day isolation of COVID here in the hospital before going out to rehab.  Planned Discharge Destination: Rehab  Time spent: 28 minutes  Author: Alford Highland, MD 09/14/2023 2:56 PM  For on call review [email or web address].

## 2023-09-14 NOTE — TOC Progression Note (Addendum)
Transition of Care [facility]) - Progression Note    Patient Details  Name: Kurt Bailey MRN: [id number] Date of Birth: 25-Aug-1945  Transition of Care [facility]) CM/SW Contact  Margarito Liner, LCSW Phone Number: 09/14/2023, 9:59 AM  Clinical Narrative:  PASARR obtained: [id number] E. Expires 3/13. CSW left voicemail for [facility] admissions coordinator to see if they can offer a bed or not.   11:54 am: Still no response from [facility]. CSW left them another message. If patient/family choose [facility], patient will have to quarantine here for 10 days from positive COVID test.  12:39 pm: CSW called and updated daughter. She expressed interest in [facility]. They are currently listed as considering. CSW left message for admissions coordinator to see what other information they need to make a decision.  1:29 pm: [facility] can accept patient on Tuesday 2/18 once he has complete his 10-day quarantine. Daughter is aware and agreeable. She will update patient's wife and son. Patient will have to be without telesitter for at least 24 hours prior to discharge. Per RN, telesitter discontinued at around 11:30 today.  Expected Discharge Plan: Skilled Nursing Facility Barriers to Discharge: Continued Medical Work up  Expected Discharge Plan and Services In-house Referral: Clinical Social Work   Post Acute Care Choice: Skilled Nursing Facility Living arrangements for the past 2 months: Single Family Home Expected Discharge Date: 09/10/23                                     Social Determinants of Health (SDOH) Interventions SDOH Screenings   Food Insecurity: No Food Insecurity (09/10/2023)  Housing: Low Risk  (09/10/2023)  Transportation Needs: No Transportation Needs (09/10/2023)  Utilities: Not At Risk (09/10/2023)  Alcohol Screen: Low Risk  (09/07/2023)  Depression (PHQ2-9): Low Risk  (09/07/2023)  Financial Resource Strain: Low Risk  (09/07/2023)   Physical Activity: Inactive (09/07/2023)  Social Connections: Unknown (09/10/2023)  Recent Concern: Social Connections - Socially Isolated (09/07/2023)  Stress: No Stress Concern Present (09/07/2023)  Tobacco Use: Low Risk  (09/09/2023)  Health Literacy: Adequate Health Literacy (09/07/2023)    Readmission Risk Interventions     No data to display

## 2023-09-14 NOTE — Care Management Important Message (Signed)
Important Message  Patient Details  Name: Kurt Bailey MRN: [id number] Date of Birth: 26-Aug-1945   Important Message Given:  Yes - Medicare IM     Sherilyn Banker 09/14/2023, 10:01 AM

## 2023-09-14 NOTE — Assessment & Plan Note (Addendum)
 Foley catheter placed on the ninth.  Continue Flomax.  Will do a voiding trial tomorrow

## 2023-09-14 NOTE — Progress Notes (Signed)
2/12 Patient under Airborne and Contact Precautions, IMM Letter given to RN assigned to patient to give to patient at bedside.

## 2023-09-15 DIAGNOSIS — R338 Other retention of urine: Secondary | ICD-10-CM | POA: Diagnosis not present

## 2023-09-15 DIAGNOSIS — G309 Alzheimer's disease, unspecified: Secondary | ICD-10-CM | POA: Diagnosis not present

## 2023-09-15 DIAGNOSIS — E785 Hyperlipidemia, unspecified: Secondary | ICD-10-CM | POA: Diagnosis not present

## 2023-09-15 DIAGNOSIS — U071 COVID-19: Secondary | ICD-10-CM | POA: Diagnosis not present

## 2023-09-15 DIAGNOSIS — G4700 Insomnia, unspecified: Secondary | ICD-10-CM

## 2023-09-15 LAB — CULTURE, BLOOD (ROUTINE X 2)
Culture: NO GROWTH
Culture: NO GROWTH
Special Requests: ADEQUATE

## 2023-09-15 MED ORDER — AMLODIPINE BESYLATE 5 MG PO TABS
2.5000 mg | ORAL_TABLET | Freq: Every day | ORAL | Status: DC
  Administered 2023-09-16: 2.5 mg via ORAL
  Filled 2023-09-15: qty 1

## 2023-09-15 MED ORDER — TRAZODONE HCL 50 MG PO TABS
25.0000 mg | ORAL_TABLET | Freq: Every day | ORAL | Status: DC
  Administered 2023-09-15 – 2023-09-18 (×4): 25 mg via ORAL
  Filled 2023-09-15 (×4): qty 1

## 2023-09-15 MED ORDER — LOSARTAN POTASSIUM 50 MG PO TABS
50.0000 mg | ORAL_TABLET | Freq: Every day | ORAL | Status: DC
  Administered 2023-09-16: 50 mg via ORAL
  Filled 2023-09-15: qty 1

## 2023-09-15 NOTE — Progress Notes (Signed)
Progress Note   Patient: Kurt Bailey [id number] DOB: Dec 01, 1945 DOA: 09/09/2023     5 DOS: the patient was seen and examined on 09/15/2023   Brief hospital course: Kurt Bailey is 78 y.o. male with alcohol abuse, diverticulitis who presents to the ED with cough, chest congestion, dyspnea and myalgias.  Is also been having recurrent falls with generalized weakness per his daughter who brought him to the ER.  In the ED he was found to mildly tachycardic and hypertensive.  Labs mostly within normal limits.  Head CT is unremarkable, C-spine CT revealed severe degenerative changes with multilevel severe osseous neuroforaminal stenosis without acutely displaced fractures or traumatic listhesis.  Patient tested positive for COVID-19.  By reevaluation on 2/8 patient reported feeling weak but was overall all stable.  Physical therapy recommending rehab.  Rehab facility will not take until 2/18 (will need to complete COVID isolation).  Assessment and Plan: * COVID-19 Supportive care.  Will need 10 days of isolation here in the hospital before going out to rehab.   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  Patient's mental status better than previous.  Acute urinary retention Foley catheter placed on the ninth.  Continue Flomax.  Will do a voiding trial either on Sunday or Monday.  Dyslipidemia Continue atorvastatin  Generalized weakness Physical therapy recommending rehab.  Insomnia Low-dose trazodone  Overweight (BMI 25.0-29.9) BMI 29.16  Vitamin B12 deficiency Continue vitamin B12.        Subjective: Patient feels okay.  Offers no complaints.  No trouble breathing.  Stated he did not sleep very well last night.  Physical Exam: Vitals:   09/14/23 1619 09/14/23 2132 09/15/23 0501 09/15/23 0752  BP: 111/65 120/74 108/64 104/66  Pulse: 72 70 75 84  Resp: 20 18 16 20   Temp: 98.3 F (36.8 C) 98.3 F (36.8  C) 98.5 F (36.9 C) 97.9 F (36.6 C)  TempSrc: Oral  Oral Oral  SpO2: 97% 98% 93% 92%  Weight:      Height:       Physical Exam HENT:     Head: Normocephalic.     Mouth/Throat:     Pharynx: No oropharyngeal exudate.  Eyes:     General: Lids are normal.     Conjunctiva/sclera: Conjunctivae normal.  Cardiovascular:     Rate and Rhythm: Normal rate and regular rhythm.     Heart sounds: Normal heart sounds, S1 normal and S2 normal.  Pulmonary:     Breath sounds: Examination of the right-lower field reveals decreased breath sounds. Examination of the left-lower field reveals decreased breath sounds. Decreased breath sounds present. No wheezing, rhonchi or rales.  Abdominal:     Palpations: Abdomen is soft.     Tenderness: There is no abdominal tenderness.  Musculoskeletal:     Right lower leg: No swelling.     Left lower leg: No swelling.  Skin:    General: Skin is warm.     Findings: No rash.  Neurological:     Mental Status: He is alert.     Comments: Patient able to straight leg raise to command.     Data Reviewed: No new data today Family Communication: Updated patient's daughter on the phone  Disposition: Status is: Inpatient Remains inpatient appropriate because: Needs 10 days of isolation before going out to rehab.  Planned Discharge Destination: Rehab    Time spent: 28 minutes  Author: Alford Highland, MD 09/15/2023 1:22 PM  For  on call review [email or web address].

## 2023-09-15 NOTE — Plan of Care (Signed)
  Problem: Respiratory: Goal: Will maintain a patent airway Outcome: Progressing   Problem: Clinical Measurements: Goal: Diagnostic test results will improve Outcome: Progressing   Problem: Activity: Goal: Risk for activity intolerance will decrease Outcome: Progressing

## 2023-09-15 NOTE — Plan of Care (Signed)

## 2023-09-15 NOTE — Assessment & Plan Note (Signed)
Low-dose trazodone

## 2023-09-16 DIAGNOSIS — G4709 Other insomnia: Secondary | ICD-10-CM

## 2023-09-16 DIAGNOSIS — E785 Hyperlipidemia, unspecified: Secondary | ICD-10-CM | POA: Diagnosis not present

## 2023-09-16 DIAGNOSIS — G309 Alzheimer's disease, unspecified: Secondary | ICD-10-CM | POA: Diagnosis not present

## 2023-09-16 DIAGNOSIS — R338 Other retention of urine: Secondary | ICD-10-CM | POA: Diagnosis not present

## 2023-09-16 DIAGNOSIS — I10 Essential (primary) hypertension: Secondary | ICD-10-CM | POA: Insufficient documentation

## 2023-09-16 DIAGNOSIS — U071 COVID-19: Secondary | ICD-10-CM | POA: Diagnosis not present

## 2023-09-16 MED ORDER — LOSARTAN POTASSIUM 25 MG PO TABS
25.0000 mg | ORAL_TABLET | Freq: Every day | ORAL | Status: DC
  Administered 2023-09-17: 25 mg via ORAL
  Filled 2023-09-16: qty 1

## 2023-09-16 NOTE — Plan of Care (Signed)

## 2023-09-16 NOTE — Progress Notes (Signed)
Occupational Therapy Treatment Patient Details Name: Kurt Bailey MRN: [id number] DOB: Aug 06, 1945 Today's Date: 09/16/2023   History of present illness Pt is a 78 y.o. 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  Pt presented to the emergency room with acute onset of mostly dry cough and chest congestion, mild dyspnea with associated myalgia, fever and chills as well as a headache.  MD assessment includes: Covid-19, general weakness, and recurrent falls.   OT comments  Mr Luckadoo was seen for OT treatment on this date. Upon arrival to room pt in bed, agreeable to tx. Pt requires MIN A + RW for ADL t/f and standing grooming tasks. MAX A don B socks in sitting. Pt making good progress toward goals, will continue to follow POC. Discharge recommendation remains appropriate.        If plan is discharge home, recommend the following:  A lot of help with bathing/dressing/bathroom;Assistance with feeding;Assistance with cooking/housework;Direct supervision/assist for medications management;Direct supervision/assist for financial management;Assist for transportation;Help with stairs or ramp for entrance;Supervision due to cognitive status;A lot of help with walking and/or transfers   Equipment Recommendations  Other (comment) (defer)    Recommendations for Other Services      Precautions / Restrictions Precautions Precautions: Fall Restrictions Weight Bearing Restrictions Per Provider Order: No       Mobility Bed Mobility Overal bed mobility: Needs Assistance Bed Mobility: Supine to Sit, Sit to Supine     Supine to sit: Min assist Sit to supine: Min assist        Transfers Overall transfer level: Needs assistance Equipment used: Rolling walker (2 wheels) Transfers: Sit to/from Stand Sit to Stand: Min assist                 Balance Overall balance assessment:  Needs assistance Sitting-balance support: Feet supported, Single extremity supported Sitting balance-Leahy Scale: Fair     Standing balance support: No upper extremity supported, During functional activity Standing balance-Leahy Scale: Poor                             ADL either performed or assessed with clinical judgement   ADL Overall ADL's : Needs assistance/impaired                                       General ADL Comments: MIN A + RW for ADL t/fand standing grooming tasks. MAX A don B socks in sitting     Communication Communication Communication: No apparent difficulties   Cognition Arousal: Alert Behavior During Therapy: Flat affect Cognition: History of cognitive impairments                               Following commands: Impaired Following commands impaired: Follows one step commands with increased time, Follows multi-step commands inconsistently      Cueing   Cueing Techniques: Verbal cues, Tactile cues             Pertinent Vitals/ Pain       Pain Assessment Pain Assessment: No/denies pain   Frequency  Min 1X/week        Progress Toward Goals  OT Goals(current goals can now be found in the care plan section)  Progress towards OT goals: Progressing toward  goals  Acute Rehab OT Goals OT Goal Formulation: With patient Time For Goal Achievement: 09/25/23 Potential to Achieve Goals: Fair ADL Goals Pt Will Perform Grooming: with set-up;standing Pt Will Perform Lower Body Dressing: with contact guard assist;sit to/from stand;sitting/lateral leans Pt Will Transfer to Toilet: with min assist;bedside commode;stand pivot transfer Pt Will Perform Toileting - Clothing Manipulation and hygiene: with min assist;sit to/from stand  Plan      Co-evaluation                 AM-PAC OT "6 Clicks" Daily Activity     Outcome Measure   Help from another person eating meals?: None Help from another person  taking care of personal grooming?: A Little Help from another person toileting, which includes using toliet, bedpan, or urinal?: A Lot Help from another person bathing (including washing, rinsing, drying)?: A Lot Help from another person to put on and taking off regular upper body clothing?: A Little Help from another person to put on and taking off regular lower body clothing?: A Lot 6 Click Score: 16    End of Session Equipment Utilized During Treatment: Rolling walker (2 wheels)  OT Visit Diagnosis: Repeated falls (R29.6);Muscle weakness (generalized) (M62.81);Unsteadiness on feet (R26.81)   Activity Tolerance Patient tolerated treatment well   Patient Left in bed;with call bell/phone within reach;with bed alarm set;with family/visitor present   Nurse Communication          Time: 1914-7829 OT Time Calculation (min): 19 min  Charges: OT General Charges $OT Visit: 1 Visit OT Treatments $Self Care/Home Management : 8-22 mins  Kathie Dike, M.S. OTR/L  09/16/23, 4:13 PM  ascom [phone]

## 2023-09-16 NOTE — Progress Notes (Signed)
Progress Note   Patient: Kurt Bailey [id number]:[id number] DOB: [birth year] DOA: 09/09/2023     6 DOS: the patient was seen and examined on 09/16/2023   Brief hospital course: ARAF CLUGSTON is 78 y.o. male with alcohol abuse, diverticulitis who presents to the ED with cough, chest congestion, dyspnea and myalgias.  Is also been having recurrent falls with generalized weakness per his daughter who brought him to the ER.  In the ED he was found to mildly tachycardic and hypertensive.  Labs mostly within normal limits.  Head CT is unremarkable, C-spine CT revealed severe degenerative changes with multilevel severe osseous neuroforaminal stenosis without acutely displaced fractures or traumatic listhesis.  Patient tested positive for COVID-19.  By reevaluation on 2/8 patient reported feeling weak but was overall all stable.  Physical therapy recommending rehab.  Rehab facility will not take until 2/18 (will need to complete COVID isolation).  2/13.  Cut back Norvasc to 2.5 mg and Cozaar to 50 mg 2/14.  Will discontinue Norvasc and cut back Cozaar to 25 mg.  Assessment and Plan: * COVID-19 Supportive care.  Will need 10 days of isolation here in the hospital before going out to rehab.   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  Patient's mental status improved from admission.  Acute urinary retention Foley catheter placed on the ninth.  Continue Flomax.  Will do a voiding trial on Sunday.  Dyslipidemia Continue atorvastatin  Generalized weakness Physical therapy recommending rehab.  Essential hypertension Blood pressure being on the lower side will discontinue Norvasc and Cozaar to 25 mg for tomorrow.  Insomnia Low-dose trazodone  Overweight (BMI 25.0-29.9) BMI 29.16  Vitamin B12 deficiency Continue vitamin B12.        Subjective: Patient feels okay.  Offers no complaints.  States he slept well.  Admitted with  COVID-19 infection and weakness.  Physical Exam: Vitals:   09/15/23 1554 09/15/23 2144 09/16/23 0224 09/16/23 0756  BP: 105/64 103/71 110/69 97/66  Pulse: 75 74 75 75  Resp: 18 17 17 20   Temp: 98.4 F (36.9 C) 98.4 F (36.9 C) 98.4 F (36.9 C) 97.8 F (36.6 C)  TempSrc: Oral Oral Oral Oral  SpO2: 94% 94% 92% 93%  Weight:      Height:       Physical Exam HENT:     Head: Normocephalic.     Mouth/Throat:     Pharynx: No oropharyngeal exudate.  Eyes:     General: Lids are normal.     Conjunctiva/sclera: Conjunctivae normal.  Cardiovascular:     Rate and Rhythm: Normal rate and regular rhythm.     Heart sounds: Normal heart sounds, S1 normal and S2 normal.  Pulmonary:     Breath sounds: Examination of the right-lower field reveals decreased breath sounds. Examination of the left-lower field reveals decreased breath sounds. Decreased breath sounds present. No wheezing, rhonchi or rales.  Abdominal:     Palpations: Abdomen is soft.     Tenderness: There is no abdominal tenderness.  Musculoskeletal:     Right lower leg: No swelling.     Left lower leg: No swelling.  Skin:    General: Skin is warm.     Findings: No rash.  Neurological:     Mental Status: He is alert.     Comments: Answer some simple questions.  Unable to tell me how far he walked with physical therapy     Data Reviewed: No new data today  Family Communication: Updated patient's daughter on the phone  Disposition: Status is: Inpatient Remains inpatient appropriate because: Need 10 days of COVID isolation prior to getting out of the hospital  Planned Discharge Destination: Rehab    Time spent: 27 minutes  Author: Alford Highland, MD 09/16/2023 1:49 PM  For on call review [email or web address].

## 2023-09-16 NOTE — TOC Progression Note (Signed)
Transition of Care [facility]) - Progression Note    Patient Details  Name: Kurt Bailey MRN: [id number] Date of Birth: 1946/06/10  Transition of Care [facility]) CM/SW Contact  Chapman Fitch, RN Phone Number: 09/16/2023, 4:13 PM  Clinical Narrative:    Message sent to Alv[facility] to confirm they will still have a bed for patient on Tuesday    Expected Discharge Plan: Skilled Nursing Facility Barriers to Discharge: Continued Medical Work up  Expected Discharge Plan and Services In-house Referral: Clinical Social Work   Post Acute Care Choice: Skilled Nursing Facility Living arrangements for the past 2 months: Single Family Home Expected Discharge Date: 09/10/23                                     Social Determinants of Health (SDOH) Interventions SDOH Screenings   Food Insecurity: No Food Insecurity (09/10/2023)  Housing: Low Risk  (09/10/2023)  Transportation Needs: No Transportation Needs (09/10/2023)  Utilities: Not At Risk (09/10/2023)  Alcohol Screen: Low Risk  (09/07/2023)  Depression (PHQ2-9): Low Risk  (09/07/2023)  Financial Resource Strain: Low Risk  (09/07/2023)  Physical Activity: Inactive (09/07/2023)  Social Connections: Unknown (09/10/2023)  Recent Concern: Social Connections - Socially Isolated (09/07/2023)  Stress: No Stress Concern Present (09/07/2023)  Tobacco Use: Low Risk  (09/09/2023)  Health Literacy: Adequate Health Literacy (09/07/2023)    Readmission Risk Interventions     No data to display

## 2023-09-16 NOTE — Assessment & Plan Note (Addendum)
 Discontinued Cozaar today, discontinue Norvasc yesterday.

## 2023-09-17 DIAGNOSIS — R338 Other retention of urine: Secondary | ICD-10-CM | POA: Diagnosis not present

## 2023-09-17 DIAGNOSIS — U071 COVID-19: Secondary | ICD-10-CM | POA: Diagnosis not present

## 2023-09-17 DIAGNOSIS — E785 Hyperlipidemia, unspecified: Secondary | ICD-10-CM | POA: Diagnosis not present

## 2023-09-17 DIAGNOSIS — G309 Alzheimer's disease, unspecified: Secondary | ICD-10-CM | POA: Diagnosis not present

## 2023-09-17 LAB — GLUCOSE, CAPILLARY: Glucose-Capillary: 167 mg/dL — ABNORMAL HIGH (ref 70–99)

## 2023-09-17 MED ORDER — IPRATROPIUM-ALBUTEROL 0.5-2.5 (3) MG/3ML IN SOLN
3.0000 mL | Freq: Three times a day (TID) | RESPIRATORY_TRACT | Status: DC
  Administered 2023-09-17 – 2023-09-19 (×2): 3 mL via RESPIRATORY_TRACT
  Filled 2023-09-17 (×3): qty 3

## 2023-09-17 MED ORDER — IPRATROPIUM-ALBUTEROL 0.5-2.5 (3) MG/3ML IN SOLN
3.0000 mL | Freq: Four times a day (QID) | RESPIRATORY_TRACT | Status: DC
  Administered 2023-09-17: 3 mL via RESPIRATORY_TRACT

## 2023-09-17 NOTE — Progress Notes (Signed)
 Physical Therapy Treatment Patient Details Name: Kurt Bailey MRN: [id number] DOB: Apr 19, 1946 Today's Date: 09/17/2023   History of Present Illness Pt is a 78 y.o. 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  Pt presented to the emergency room with acute onset of mostly dry cough and chest congestion, mild dyspnea with associated myalgia, fever and chills as well as a headache.  MD assessment includes: Covid-19, general weakness, and recurrent falls.    PT Comments  Pt ready for session.  Needs min a x 1 and verbal/tactile cues for hand placements.  Mostly assist for upper body and to scoot hips fully to edge of bed.  He is steady in sitting today with minor dizziness that clears quickly which he stated is his baseline.  He stands with min a x 1 and is able to walk to door and back to recliner with RW and min a x 1.  Generally poor gait quality with short shuffling steps and noted sway/imbalances.  Hands on assist at all times for balance and to navigate walker with turns.  He takes a short seated rest then participates in standing ex with walker support for marches and SLR where he struggles to complete x 10 each due to fatigue.  Remained in chair with needs met.  Some tremors noted at rest but pt does state he is cold and attributes it to being cold.  Blanket given and heat increased in room.   If plan is discharge home, recommend the following: A little help with walking and/or transfers;A little help with bathing/dressing/bathroom;Assistance with cooking/housework;Assistance with feeding;Direct supervision/assist for medications management;Direct supervision/assist for financial management;Assist for transportation;Help with stairs or ramp for entrance;Supervision due to cognitive status   Can travel by private vehicle        Equipment Recommendations       Recommendations for Other  Services       Precautions / Restrictions Precautions Precautions: Fall Restrictions Weight Bearing Restrictions Per Provider Order: No     Mobility  Bed Mobility Overal bed mobility: Needs Assistance Bed Mobility: Supine to Sit     Supine to sit: Min assist       Patient Response: Cooperative  Transfers Overall transfer level: Needs assistance Equipment used: Rolling walker (2 wheels) Transfers: Sit to/from Stand Sit to Stand: Min assist                Ambulation/Gait Ambulation/Gait assistance: Editor, commissioning (Feet): 15 Feet Assistive device: Rolling walker (2 wheels) Gait Pattern/deviations: Step-to pattern, Decreased step length - left, Decreased step length - right, Shuffle Gait velocity: decreased     General Gait Details: generally poor gait quality with decreased step height and length, generally shuffling with +1 for safety   Stairs             Wheelchair Mobility     Tilt Bed Tilt Bed Patient Response: Cooperative  Modified Rankin (Stroke Patients Only)       Balance Overall balance assessment: Needs assistance Sitting-balance support: Feet supported, Single extremity supported Sitting balance-Leahy Scale: Fair     Standing balance support: Bilateral upper extremity supported Standing balance-Leahy Scale: Poor Standing balance comment: generally unsteady with +1 to prevent falls                            Communication    Cognition Arousal: Alert Behavior During Therapy:  WFL for tasks assessed/performed   PT - Cognitive impairments: History of cognitive impairments                                Cueing    Exercises      General Comments        Pertinent Vitals/Pain Pain Assessment Pain Assessment: No/denies pain    Home Living                          Prior Function            PT Goals (current goals can now be found in the care plan section) Progress towards  PT goals: Progressing toward goals    Frequency    Min 1X/week      PT Plan      Co-evaluation              AM-PAC PT "6 Clicks" Mobility   Outcome Measure  Help needed turning from your back to your side while in a flat bed without using bedrails?: A Little Help needed moving from lying on your back to sitting on the side of a flat bed without using bedrails?: A Little Help needed moving to and from a bed to a chair (including a wheelchair)?: A Little Help needed standing up from a chair using your arms (e.g., wheelchair or bedside chair)?: A Little Help needed to walk in hospital room?: A Little Help needed climbing 3-5 steps with a railing? : A Lot 6 Click Score: 17    End of Session Equipment Utilized During Treatment: Gait belt Activity Tolerance: Patient tolerated treatment well Patient left: in chair;with call bell/phone within reach;with chair alarm set Nurse Communication: Mobility status PT Visit Diagnosis: Unsteadiness on feet (R26.81);History of falling (Z91.81);Difficulty in walking, not elsewhere classified (R26.2);Muscle weakness (generalized) (M62.81)     Time: 1610-9604 PT Time Calculation (min) (ACUTE ONLY): 17 min  Charges:    $Gait Training: 8-22 mins PT General Charges $$ ACUTE PT VISIT: 1 Visit                   Danielle Dess, PTA 09/17/23, 11:01 AM

## 2023-09-17 NOTE — Progress Notes (Signed)
 Progress Note   Patient: Kurt Bailey ZOX:[id number] DOB: Dec 20, 1945 DOA: 09/09/2023     7 DOS: the patient was seen and examined on 09/17/2023   Brief hospital course: [name] is 78 y.o. male with alcohol abuse, diverticulitis who presents to the ED with cough, chest congestion, dyspnea and myalgias.  Is also been having recurrent falls with generalized weakness per his daughter who brought him to the ER.  In the ED he was found to mildly tachycardic and hypertensive.  Labs mostly within normal limits.  Head CT is unremarkable, C-spine CT revealed severe degenerative changes with multilevel severe osseous neuroforaminal stenosis without acutely displaced fractures or traumatic listhesis.  Patient tested positive for COVID-19.  By reevaluation on 2/8 patient reported feeling weak but was overall all stable.  Physical therapy recommending rehab.  Rehab facility will not take until 2/18 (will need to complete COVID isolation).  2/13.  Cut back Norvasc to 2.5 mg and Cozaar to 50 mg 2/14.  Will discontinue Norvasc and cut back Cozaar to 25 mg. 2/15.  Discontinue Cozaar.  Wheezing today will add standing dose nebulizers.  Assessment and Plan: * COVID-19 Supportive care.  Will need 10 days of isolation here in the hospital before going out to rehab.  Patient wheezing today we will add standing dose nebulizers to see if this will improve.  Not having any fever.   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  Patient's mental status improved from admission.  Acute urinary retention Foley catheter placed on the ninth.  Continue Flomax.  Will do a voiding trial tomorrow  Dyslipidemia Continue atorvastatin  Generalized weakness Physical therapy recommending rehab.  Walked 15 feet today  Essential hypertension Discontinued Cozaar today, discontinue Norvasc yesterday.  Insomnia Low-dose trazodone  Overweight (BMI  25.0-29.9) BMI 28.73  Vitamin B12 deficiency Continue vitamin B12.        Subjective: Patient complaining that he does not like being bossed around.  Patient knows he is in the hospital and states he slept okay last night.  Wheeze heard with auscultation.  Admitted with COVID-19 infection  Physical Exam: Vitals:   09/16/23 2015 09/17/23 0225 09/17/23 0500 09/17/23 0756  BP: 104/64 109/67  99/62  Pulse: 75 85  73  Resp: 18 20  16   Temp: 98.4 F (36.9 C) 98.2 F (36.8 C)  98.6 F (37 C)  TempSrc: Oral Oral  Oral  SpO2: 94% 95%  92%  Weight:   85.7 kg   Height:       Physical Exam HENT:     Head: Normocephalic.     Mouth/Throat:     Pharynx: No oropharyngeal exudate.  Eyes:     General: Lids are normal.     Conjunctiva/sclera: Conjunctivae normal.  Cardiovascular:     Rate and Rhythm: Normal rate and regular rhythm.     Heart sounds: Normal heart sounds, S1 normal and S2 normal.  Pulmonary:     Breath sounds: Transmitted upper airway sounds present. Examination of the right-middle field reveals wheezing. Examination of the left-middle field reveals wheezing. Examination of the right-lower field reveals decreased breath sounds and wheezing. Examination of the left-lower field reveals decreased breath sounds and wheezing. Decreased breath sounds and wheezing present. No rhonchi or rales.  Abdominal:     Palpations: Abdomen is soft.     Tenderness: There is no abdominal tenderness.  Musculoskeletal:     Right lower leg: No swelling.  Left lower leg: No swelling.  Skin:    General: Skin is warm.     Findings: No rash.  Neurological:     Mental Status: He is alert.     Data Reviewed: No new laboratory data today  Family Communication: Updated daughter on the phone  Disposition: Status is: Inpatient Remains inpatient appropriate because: Patient needs 10 days of isolation with COVID.  Wheezing a little bit today will add standing dose nebulizers.  Planned  Discharge Destination: Rehab    Time spent: 28 minutes  Author: Alford Highland, MD 09/17/2023 1:14 PM  For on call review [email or web address].

## 2023-09-18 ENCOUNTER — Inpatient Hospital Stay: Payer: Medicare Other

## 2023-09-18 DIAGNOSIS — G309 Alzheimer's disease, unspecified: Secondary | ICD-10-CM | POA: Diagnosis not present

## 2023-09-18 DIAGNOSIS — E785 Hyperlipidemia, unspecified: Secondary | ICD-10-CM | POA: Diagnosis not present

## 2023-09-18 DIAGNOSIS — D696 Thrombocytopenia, unspecified: Secondary | ICD-10-CM | POA: Insufficient documentation

## 2023-09-18 DIAGNOSIS — U071 COVID-19: Secondary | ICD-10-CM | POA: Diagnosis not present

## 2023-09-18 DIAGNOSIS — R338 Other retention of urine: Secondary | ICD-10-CM | POA: Diagnosis not present

## 2023-09-18 LAB — CBC
HCT: 36.9 % — ABNORMAL LOW (ref 39.0–52.0)
Hemoglobin: 12.5 g/dL — ABNORMAL LOW (ref 13.0–17.0)
MCH: 34.5 pg — ABNORMAL HIGH (ref 26.0–34.0)
MCHC: 33.9 g/dL (ref 30.0–36.0)
MCV: 101.9 fL — ABNORMAL HIGH (ref 80.0–100.0)
Platelets: 206 10*3/uL (ref 150–400)
RBC: 3.62 MIL/uL — ABNORMAL LOW (ref 4.22–5.81)
RDW: 14.6 % (ref 11.5–15.5)
WBC: 9.3 10*3/uL (ref 4.0–10.5)
nRBC: 0 % (ref 0.0–0.2)

## 2023-09-18 LAB — BASIC METABOLIC PANEL
Anion gap: 7 (ref 5–15)
BUN: 21 mg/dL (ref 8–23)
CO2: 27 mmol/L (ref 22–32)
Calcium: 8.7 mg/dL — ABNORMAL LOW (ref 8.9–10.3)
Chloride: 102 mmol/L (ref 98–111)
Creatinine, Ser: 0.97 mg/dL (ref 0.61–1.24)
GFR, Estimated: >60 mL/min (ref >60)
Glucose, Bld: 99 mg/dL (ref 70–99)
Potassium: 4.5 mmol/L (ref 3.5–5.1)
Sodium: 136 mmol/L (ref 135–145)

## 2023-09-18 MED ORDER — ORAL CARE MOUTH RINSE: 15.0000 mL | OROMUCOSAL | Status: DC | PRN

## 2023-09-18 MED ORDER — ORAL CARE MOUTH RINSE: 15.0000 mL | OROMUCOSAL | Status: DC

## 2023-09-18 NOTE — Progress Notes (Signed)
 Progress Note   Patient: Kurt Bailey:[id number] DOB: 15-Sep-1945 DOA: 09/09/2023     8 DOS: the patient was seen and examined on 09/18/2023   Brief hospital course: Kurt Bailey is 78 y.o. male with alcohol abuse, diverticulitis who presents to the ED with cough, chest congestion, dyspnea and myalgias.  Is also been having recurrent falls with generalized weakness per his daughter who brought him to the ER.  In the ED he was found to mildly tachycardic and hypertensive.  Labs mostly within normal limits.  Head CT is unremarkable, C-spine CT revealed severe degenerative changes with multilevel severe osseous neuroforaminal stenosis without acutely displaced fractures or traumatic listhesis.  Patient tested positive for COVID-19.  By reevaluation on 2/8 patient reported feeling weak but was overall all stable.  Physical therapy recommending rehab.  Rehab facility will not take until 2/18 (will need to complete COVID isolation).  2/13.  Cut back Norvasc to 2.5 mg and Cozaar to 50 mg 2/14.  Will discontinue Norvasc and cut back Cozaar to 25 mg. 2/15.  Discontinue Cozaar.  Wheezing today will add standing dose nebulizers.  Assessment and Plan: * COVID-19 Supportive care.  Repeat chest x-ray negative.  White blood cell count normal.  Nebulizers for wheezing.  Notified that the facility will take him tomorrow.   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  Patient's mental status improved from admission.  Acute urinary retention Foley catheter placed on the ninth.  Continue Flomax.  Voiding trial today.  Continue to monitor bladder scan.  Dyslipidemia Continue atorvastatin  Generalized weakness Physical therapy recommending rehab.  Walked 15 yesterday  Thrombocytopenia (HCC) Secondary to viral COVID-19 infection.  Platelet count has improved into the normal range  Essential hypertension Continue to hold Cozaar and  Norvasc.  Insomnia Low-dose trazodone  Overweight (BMI 25.0-29.9) BMI 29.13  Vitamin B12 deficiency Continue vitamin B12.        Subjective: Patient feels okay.  A little bit of a cough.  Still with a little slight wheeze.  Admitted with COVID-19 infection.  Physical Exam: Vitals:   09/18/23 0101 09/18/23 0124 09/18/23 0414 09/18/23 0955  BP:  106/65 106/60 119/70  Pulse:  69 64 66  Resp:  18 16 18   Temp:  97.8 F (36.6 C) 98.1 F (36.7 C) 97.9 F (36.6 C)  TempSrc:  Oral Oral   SpO2:  93% 91% 92%  Weight: 86.9 kg     Height:       Physical Exam HENT:     Head: Normocephalic.     Mouth/Throat:     Pharynx: No oropharyngeal exudate.  Eyes:     General: Lids are normal.     Conjunctiva/sclera: Conjunctivae normal.  Cardiovascular:     Rate and Rhythm: Normal rate and regular rhythm.     Heart sounds: Normal heart sounds, S1 normal and S2 normal.  Pulmonary:     Breath sounds: No transmitted upper airway sounds. Examination of the right-lower field reveals decreased breath sounds and wheezing. Examination of the left-lower field reveals decreased breath sounds and wheezing. Decreased breath sounds and wheezing present. No rhonchi or rales.  Abdominal:     Palpations: Abdomen is soft.     Tenderness: There is no abdominal tenderness.  Musculoskeletal:     Right lower leg: No swelling.     Left lower leg: No swelling.  Skin:    General: Skin is warm.     Findings: No  rash.  Neurological:     Mental Status: He is alert.     Data Reviewed: Chest x-ray negative Creatinine 0.97, hemoglobin 12.5, platelet count 206, white blood count 9.3 Family Communication: Updated daughter on the phone  Disposition: Status is: Inpatient Remains inpatient appropriate because: Patient having voiding trial today with Foley catheter removal.  Notified that facility will take tomorrow.  Planned Discharge Destination: Skilled nursing facility    Time spent: 28  minutes  Author: Alford Highland, MD 09/18/2023 2:13 PM  For on call review [email or web address].

## 2023-09-18 NOTE — TOC Progression Note (Addendum)
 Transition of Care [facility]) - Progression Note    Patient Details  Name: Kurt Bailey MRN: [id number] Date of Birth: 1945/08/24  [facility]) CM/SW Contact  Liliana Cline, LCSW Phone Number: 09/18/2023, 9:18 AM  Clinical Narrative:    Sent message to [facility] Admissions to inquire whether they can still take this patient on Tuesday - awaiting a response.  11:00- [facility] states the plan is for patient to go there on Tuesday.  11:15- [facility] states they can take patient tomorrow and have a private room for him, is aware he was COVID positive 2/7. Notified MD.   Expected Discharge Plan: Skilled Nursing Facility Barriers to Discharge: Continued Medical Work up  Expected Discharge Plan and Services In-house Referral: Clinical Social Work   Post Acute Care Choice: Skilled Nursing Facility Living arrangements for the past 2 months: Single Family Home Expected Discharge Date: 09/10/23                                     Social Determinants of Health (SDOH) Interventions SDOH Screenings   Food Insecurity: No Food Insecurity (09/10/2023)  Housing: Low Risk  (09/10/2023)  Transportation Needs: No Transportation Needs (09/10/2023)  Utilities: Not At Risk (09/10/2023)  Alcohol Screen: Low Risk  (09/07/2023)  Depression (PHQ2-9): Low Risk  (09/07/2023)  Financial Resource Strain: Low Risk  (09/07/2023)  Physical Activity: Inactive (09/07/2023)  Social Connections: Unknown (09/10/2023)  Recent Concern: Social Connections - Socially Isolated (09/07/2023)  Stress: No Stress Concern Present (09/07/2023)  Tobacco Use: Low Risk  (09/09/2023)  Health Literacy: Adequate Health Literacy (09/07/2023)    Readmission Risk Interventions     No data to display

## 2023-09-18 NOTE — Assessment & Plan Note (Signed)
 Secondary to viral COVID-19 infection.  Platelet count has improved into the normal range

## 2023-09-19 DIAGNOSIS — I10 Essential (primary) hypertension: Secondary | ICD-10-CM | POA: Diagnosis not present

## 2023-09-19 DIAGNOSIS — F02818 Dementia in other diseases classified elsewhere, unspecified severity, with other behavioral disturbance: Secondary | ICD-10-CM | POA: Diagnosis not present

## 2023-09-19 DIAGNOSIS — G309 Alzheimer's disease, unspecified: Secondary | ICD-10-CM | POA: Diagnosis not present

## 2023-09-19 DIAGNOSIS — R2689 Other abnormalities of gait and mobility: Secondary | ICD-10-CM | POA: Diagnosis not present

## 2023-09-19 DIAGNOSIS — E538 Deficiency of other specified B group vitamins: Secondary | ICD-10-CM | POA: Diagnosis not present

## 2023-09-19 DIAGNOSIS — E785 Hyperlipidemia, unspecified: Secondary | ICD-10-CM | POA: Diagnosis not present

## 2023-09-19 DIAGNOSIS — Z743 Need for continuous supervision: Secondary | ICD-10-CM | POA: Diagnosis not present

## 2023-09-19 DIAGNOSIS — R41841 Cognitive communication deficit: Secondary | ICD-10-CM | POA: Diagnosis not present

## 2023-09-19 DIAGNOSIS — R278 Other lack of coordination: Secondary | ICD-10-CM | POA: Diagnosis not present

## 2023-09-19 DIAGNOSIS — R338 Other retention of urine: Secondary | ICD-10-CM | POA: Diagnosis not present

## 2023-09-19 DIAGNOSIS — R531 Weakness: Secondary | ICD-10-CM | POA: Diagnosis not present

## 2023-09-19 DIAGNOSIS — M6281 Muscle weakness (generalized): Secondary | ICD-10-CM | POA: Diagnosis not present

## 2023-09-19 DIAGNOSIS — R279 Unspecified lack of coordination: Secondary | ICD-10-CM | POA: Diagnosis not present

## 2023-09-19 DIAGNOSIS — R488 Other symbolic dysfunctions: Secondary | ICD-10-CM | POA: Diagnosis not present

## 2023-09-19 DIAGNOSIS — E663 Overweight: Secondary | ICD-10-CM | POA: Diagnosis not present

## 2023-09-19 DIAGNOSIS — Z8616 Personal history of COVID-19: Secondary | ICD-10-CM | POA: Diagnosis not present

## 2023-09-19 DIAGNOSIS — M542 Cervicalgia: Secondary | ICD-10-CM | POA: Diagnosis not present

## 2023-09-19 DIAGNOSIS — U071 COVID-19: Secondary | ICD-10-CM | POA: Diagnosis not present

## 2023-09-19 DIAGNOSIS — G4700 Insomnia, unspecified: Secondary | ICD-10-CM | POA: Diagnosis not present

## 2023-09-19 DIAGNOSIS — F01518 Vascular dementia, unspecified severity, with other behavioral disturbance: Secondary | ICD-10-CM | POA: Diagnosis not present

## 2023-09-19 DIAGNOSIS — Z23 Encounter for immunization: Secondary | ICD-10-CM | POA: Diagnosis not present

## 2023-09-19 DIAGNOSIS — D696 Thrombocytopenia, unspecified: Secondary | ICD-10-CM | POA: Diagnosis not present

## 2023-09-19 DIAGNOSIS — I639 Cerebral infarction, unspecified: Secondary | ICD-10-CM | POA: Diagnosis not present

## 2023-09-19 DIAGNOSIS — R339 Retention of urine, unspecified: Secondary | ICD-10-CM | POA: Diagnosis not present

## 2023-09-19 MED ORDER — TAMSULOSIN HCL 0.4 MG PO CAPS: 0.4000 mg | ORAL_CAPSULE | Freq: Every day | ORAL | 0 refills | Status: DC

## 2023-09-19 MED ORDER — DM-GUAIFENESIN ER 30-600 MG PO TB12: 1.0000 | ORAL_TABLET | Freq: Two times a day (BID) | ORAL | 0 refills | Status: DC | PRN

## 2023-09-19 MED ORDER — ADULT MULTIVITAMIN W/MINERALS CH: 1.0000 | ORAL_TABLET | Freq: Every day | ORAL | 0 refills | Status: DC

## 2023-09-19 MED ORDER — ENSURE ENLIVE PO LIQD: 237.0000 mL | Freq: Three times a day (TID) | ORAL | 0 refills | Status: DC

## 2023-09-19 MED ORDER — COMBIVENT RESPIMAT 20-100 MCG/ACT IN AERS: 1.0000 | INHALATION_SPRAY | Freq: Four times a day (QID) | RESPIRATORY_TRACT | 0 refills | Status: DC | PRN

## 2023-09-19 MED ORDER — TRAZODONE HCL 50 MG PO TABS: 25.0000 mg | ORAL_TABLET | Freq: Every day | ORAL | 0 refills | Status: DC

## 2023-09-19 NOTE — TOC Transition Note (Signed)
 Transition of Care [facility]) - Discharge Note   Patient Details  Name: Kurt Bailey MRN: [id number] Date of Birth: [birth year]  [facility]) CM/SW Contact:  Garret Reddish, RN Phone Number: 09/19/2023, 10:45 AM   Clinical Narrative:    Chart reviewed.  Noted that patient will be a discharge for today.  I have spoken with Alvino Chapel with [facility]  She informs me that patient is able to discharge to the facility today.  I have sent [facility] patient's Discharge Summary, SNF Transport Report, and Discharge orders via Epic hub.  Alvino Chapel reports that patient will go to room 101 and the number to call report is [phone].  I have made patient's daughter Kyra Manges aware of discharge for today.   I have arranged EMS transport with [facility] to transport patient to the facility today.    I have made staff nurse aware.     Final next level of care: Skilled Nursing Facility Barriers to Discharge: No Barriers Identified   Patient Goals and CMS Choice Patient states their goals for this hospitalization and ongoing recovery are:: Per daughter, would like to see him get a little stronger before he goes home CMS Medicare.gov Compare Post Acute Care list provided to:: Patient Choice offered to / list presented to : Patient      Discharge Placement              Patient chooses bed at: [facility] Patient to be transferred to facility by: [facility] Name of family member notified: Delia Heady( patient's daughter) Patient and family notified of of transfer: 09/19/23  Discharge Plan and Services Additional resources added to the After Visit Summary for   In-house Referral: Clinical Social Work   Post Acute Care Choice: Skilled Nursing Facility                               Social Drivers of Health (SDOH) Interventions SDOH Screenings   Food Insecurity: No Food Insecurity (09/10/2023)  Housing: Low Risk  (09/10/2023)  Transportation Needs: No  Transportation Needs (09/10/2023)  Utilities: Not At Risk (09/10/2023)  Alcohol Screen: Low Risk  (09/07/2023)  Depression (PHQ2-9): Low Risk  (09/07/2023)  Financial Resource Strain: Low Risk  (09/07/2023)  Physical Activity: Inactive (09/07/2023)  Social Connections: Unknown (09/10/2023)  Recent Concern: Social Connections - Socially Isolated (09/07/2023)  Stress: No Stress Concern Present (09/07/2023)  Tobacco Use: Low Risk  (09/09/2023)  Health Literacy: Adequate Health Literacy (09/07/2023)     Readmission Risk Interventions     No data to display

## 2023-09-19 NOTE — Discharge Summary (Signed)
 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 male with alcohol abuse, diverticulitis who presents to the ED with cough, chest congestion, dyspnea and myalgias.  Is also been having recurrent falls with generalized weakness per his daughter who brought him to the ER.  In the ED he was found to mildly tachycardic and hypertensive.  Labs mostly within normal limits.  Head CT is unremarkable, C-spine CT revealed severe degenerative changes with multilevel severe osseous neuroforaminal stenosis without acutely displaced fractures or traumatic listhesis.  Patient tested positive for COVID-19.  By reevaluation on 2/8 patient reported feeling weak but was overall all stable.  Physical therapy recommending rehab.  Rehab facility will not take until 2/18 (will need to complete COVID isolation).  2/13.  Cut back Norvasc to 2.5 mg and Cozaar to 50 mg 2/14.  Will discontinue Norvasc and cut back Cozaar to 25 mg. 2/15.  Discontinue Cozaar.  Wheezing today will add standing dose nebulizers. 2/16.  Foley removed and patient urinating 2/17.  Facility will take patient today.  Covid isolation can end at 21:18 tonight.  Assessment and Plan: * COVID-19 Supportive care.  Repeat chest x-ray negative.  White blood cell count normal.  After coughing lungs are clear.  Nebulizers here but will prescribe Combivent prn at  facility.   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  Patient's mental status improved from admission.  Acute urinary retention Foley catheter placed on the ninth.  Continue Flomax.  Foley removed on 2/16 and patient is urinating.  Dyslipidemia Continue atorvastatin  Generalized weakness Physical therapy recommending rehab.   Thrombocytopenia (HCC) Secondary to viral COVID-19 infection.  Platelet count has improved into the normal range  Essential hypertension Continue to hold Cozaar and Norvasc.  Insomnia Low-dose trazodone  Overweight (BMI 25.0-29.9) BMI 28.56  Vitamin B12 deficiency Continue vitamin B12.         Consultants: none Procedures performed: none Disposition: Rehabilitation facility Diet recommendation:  Dysphagia 3 diet with thin liquids DISCHARGE MEDICATION: Allergies as of 09/19/2023   No Known Allergies      Medication List     TAKE these medications    acetaminophen 500 MG tablet Commonly known as: TYLENOL Take 500 mg by mouth every 6 (six) hours as needed.   atorvastatin 40 MG tablet Commonly known as: LIPITOR TAKE 1 TABLET BY MOUTH ONCE DAILY FOR CHOLESTEROL   Combivent Respimat 20-100 MCG/ACT Aers respimat Generic drug: Ipratropium-Albuterol Inhale 1 puff into the lungs every 6 (six) hours as needed for wheezing.   cyanocobalamin 1000 MCG tablet Commonly known as: VITAMIN B12 Take 1,000 mcg by mouth every evening.   cyanocobalamin 1000 MCG tablet Take by mouth.   dextromethorphan-guaiFENesin 30-600 MG 12hr tablet Commonly known as: MUCINEX DM Take 1 tablet by mouth 2 (two) times daily as needed for cough.   divalproex 500 MG DR  tablet Commonly known as: DEPAKOTE Take 500 mg by mouth 2 (two) times daily.   feeding supplement Liqd Take 237 mLs by mouth 3 (three) times daily between meals.   memantine 10 MG tablet Commonly known as: NAMENDA Take 10 mg by  mouth 2 (two) times daily.   multivitamin with minerals Tabs tablet Take 1 tablet by mouth daily.   Omega-3 1000 MG Caps Take 1,000 mg by mouth every evening.   tamsulosin 0.4 MG Caps capsule Commonly known as: FLOMAX Take 1 capsule (0.4 mg total) by mouth daily after supper.   traZODone 50 MG tablet Commonly known as: DESYREL Take 0.5 tablets (25 mg total) by mouth at bedtime.        Follow-up Information     team at rehab Follow up in 1 day(s).                 Discharge Exam: Filed Weights   09/17/23 0500 09/18/23 0101 09/19/23 0446  Weight: 85.7 kg 86.9 kg 85.2 kg   Physical Exam HENT:     Head: Normocephalic.     Mouth/Throat:     Pharynx: No oropharyngeal exudate.  Eyes:     General: Lids are normal.     Conjunctiva/sclera: Conjunctivae normal.  Cardiovascular:     Rate and Rhythm: Normal rate and regular rhythm.     Heart sounds: S1 normal and S2 normal. Murmur heard.     Systolic murmur is present with a grade of 2/6.  Pulmonary:     Breath sounds: No transmitted upper airway sounds. Examination of the right-lower field reveals decreased breath sounds. Examination of the left-lower field reveals decreased breath sounds. Decreased breath sounds present. No wheezing, rhonchi or rales.     Comments: After coughing his lungs are clear. Abdominal:     Palpations: Abdomen is soft.     Tenderness: There is no abdominal tenderness.  Musculoskeletal:     Right lower leg: No swelling.     Left lower leg: No swelling.  Skin:    General: Skin is warm.     Findings: No rash.  Neurological:     Mental Status: He is alert.      Condition at discharge: stable  The results of significant diagnostics from this hospitalization (including imaging, microbiology, ancillary and laboratory) are listed below for reference.   Imaging Studies: DG Chest Port 1 View Result Date: 09/18/2023 CLINICAL DATA:  Wheezing. Recent COVID. Acute hypoxic respiratory failure.  EXAM: PORTABLE CHEST 1 VIEW COMPARISON:  09/10/2023 FINDINGS: The heart size and mediastinal contours are unchanged. There is no pleural fluid, interstitial edema or consolidative change. Dense nodule within the right upper lobe is favored to represent a benign granuloma. Visualized osseous structures appear intact. IMPRESSION: No acute cardiopulmonary abnormalities. Electronically Signed   By: Signa Kell M.D.   On: 09/18/2023 13:34   DG Abd 1 View Result Date: 09/11/2023 CLINICAL DATA:  Abdominal distention. EXAM: ABDOMEN - 1 VIEW COMPARISON:  None Available. FINDINGS: Diffuse gaseous distention of small bowel and colon is seen, consistent with ileus. No radio-opaque calculi or other significant radiographic abnormality are seen. IMPRESSION: Ileus pattern. Electronically Signed   By: Danae Orleans M.D.   On: 09/11/2023 13:01   DG Chest Port 1 View Result Date: 09/10/2023 CLINICAL DATA:  Questionable sepsis.  Weakness. EXAM: PORTABLE CHEST 1 VIEW COMPARISON:  05/13/2020 FINDINGS: Low lung volumes. No confluent airspace opacities or effusions. Heart and mediastinal contours are within normal limits. No acute bony  abnormality. IMPRESSION: Low lung volumes.  No active disease. Electronically Signed   By: Charlett Nose M.D.   On: 09/10/2023 00:08   CT Head Wo Contrast Result Date: 09/09/2023 CLINICAL DATA:  Neuro deficit, acute, stroke suspected; Neck trauma (Age >= 65y) Pt to ed from home via [facility] for weakness EXAM: CT HEAD WITHOUT CONTRAST CT CERVICAL SPINE WITHOUT CONTRAST TECHNIQUE: Multidetector CT imaging of the head and cervical spine was performed following the standard protocol without intravenous contrast. Multiplanar CT image reconstructions of the cervical spine were also generated. RADIATION DOSE REDUCTION: This exam was performed according to the departmental dose-optimization program which includes automated exposure control, adjustment of the mA and/or kV according to patient size and/or use of  iterative reconstruction technique. COMPARISON:  MRI head 06/06/2023 FINDINGS: CT HEAD FINDINGS Brain: Cerebral ventricle sizes are concordant with the degree of cerebral volume loss. Patchy and confluent areas of decreased attenuation are noted throughout the deep and periventricular white matter of the cerebral hemispheres bilaterally, compatible with chronic microvascular ischemic disease. 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 No evidence of large-territorial acute infarction. No parenchymal hemorrhage. No mass lesion. No extra-axial collection. No mass effect or midline shift. No hydrocephalus. Basilar cisterns are patent. Vascular: No hyperdense vessel. Atherosclerotic calcifications are present within the cavernous internal carotid arteries. Skull: No acute fracture or focal lesion. Sinuses/Orbits: Bilateral maxillary, sphenoid, ethmoid sinus mucosal thickening. Otherwise paranasal sinuses and mastoid air cells are clear. The orbits are unremarkable. Other: None. CT CERVICAL SPINE FINDINGS Alignment: Normal. Skull base and vertebrae: Multilevel severe degenerative changes spine with associated multilevel severe osseous neural foraminal stenosis. No severe osseous central canal stenosis. No acute fracture. No aggressive appearing focal osseous lesion or focal pathologic process. Soft tissues and spinal canal: No prevertebral fluid or swelling. No visible canal hematoma. Upper chest: Unremarkable. Other: None. IMPRESSION: 1. No acute intracranial abnormality. 2. No acute displaced fracture or traumatic listhesis of the cervical spine. 3. Multilevel severe degenerative changes spine with associated multilevel severe osseous neural foraminal stenosis. Electronically Signed   By: Tish Frederickson M.D.   On: 09/09/2023 21:42   CT Cervical Spine Wo Contrast Result Date: 09/09/2023 CLINICAL DATA:  Neuro deficit, acute, stroke suspected; Neck trauma (Age >= 65y) Pt to ed from  home via [facility] for weakness EXAM: CT HEAD WITHOUT CONTRAST CT CERVICAL SPINE WITHOUT CONTRAST TECHNIQUE: Multidetector CT imaging of the head and cervical spine was performed following the standard protocol without intravenous contrast. Multiplanar CT image reconstructions of the cervical spine were also generated. RADIATION DOSE REDUCTION: This exam was performed according to the departmental dose-optimization program which includes automated exposure control, adjustment of the mA and/or kV according to patient size and/or use of iterative reconstruction technique. COMPARISON:  MRI head 06/06/2023 FINDINGS: CT HEAD FINDINGS Brain: Cerebral ventricle sizes are concordant with the degree of cerebral volume loss. Patchy and confluent areas of decreased attenuation are noted throughout the deep and periventricular white matter of the cerebral hemispheres bilaterally, compatible with chronic microvascular ischemic disease. 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 No evidence of large-territorial acute infarction. No parenchymal hemorrhage. No mass lesion. No extra-axial collection. No mass effect or midline shift. No hydrocephalus. Basilar cisterns are patent. Vascular: No hyperdense vessel. Atherosclerotic calcifications are present within the cavernous internal carotid arteries. Skull: No acute fracture or focal lesion. Sinuses/Orbits: Bilateral maxillary, sphenoid, ethmoid sinus mucosal thickening. Otherwise paranasal sinuses and mastoid air cells are clear. The orbits are  unremarkable. Other: None. CT CERVICAL SPINE FINDINGS Alignment: Normal. Skull base and vertebrae: Multilevel severe degenerative changes spine with associated multilevel severe osseous neural foraminal stenosis. No severe osseous central canal stenosis. No acute fracture. No aggressive appearing focal osseous lesion or focal pathologic process. Soft tissues and spinal canal: No prevertebral fluid or  swelling. No visible canal hematoma. Upper chest: Unremarkable. Other: None. IMPRESSION: 1. No acute intracranial abnormality. 2. No acute displaced fracture or traumatic listhesis of the cervical spine. 3. Multilevel severe degenerative changes spine with associated multilevel severe osseous neural foraminal stenosis. Electronically Signed   By: Tish Frederickson M.D.   On: 09/09/2023 21:42    Microbiology: Results for orders placed or performed during the hospital encounter of 09/09/23  Resp panel by RT-PCR (RSV, Flu A&B, Covid) Anterior Nasal Swab     Status: Abnormal   Collection Time: 09/09/23  9:18 PM   Specimen: Anterior Nasal Swab  Result Value Ref Range Status   SARS Coronavirus 2 by RT PCR POSITIVE (A) NEGATIVE Final    Comment: (NOTE) SARS-CoV-2 target nucleic acids are DETECTED.  The SARS-CoV-2 RNA is generally detectable in upper respiratory specimens during the acute phase of infection. Positive results are indicative of the presence of the identified virus, but do not rule out bacterial infection or co-infection with other pathogens not detected by the test. Clinical correlation with patient history and other diagnostic information is necessary to determine patient infection status. The expected result is Negative.  Fact Sheet for Patients: BloggerCourse.com  Fact Sheet for Healthcare Providers: SeriousBroker.it  This test is not yet approved or cleared by the [place] FDA and  has been authorized for detection and/or diagnosis of SARS-CoV-2 by FDA under an Emergency Use Authorization (EUA).  This EUA will remain in effect (meaning this test can be used) for the duration of  the COVID-19 declaration under Section 564(b)(1) of the A ct, 21 U.S.C. section 360bbb-3(b)(1), unless the authorization is terminated or revoked sooner.     Influenza A by PCR NEGATIVE NEGATIVE Final   Influenza B by PCR NEGATIVE NEGATIVE  Final    Comment: (NOTE) The Xpert Xpress SARS-CoV-2/FLU/RSV plus assay is intended as an aid in the diagnosis of influenza from Nasopharyngeal swab specimens and should not be used as a sole basis for treatment. Nasal washings and aspirates are unacceptable for Xpert Xpress SARS-CoV-2/FLU/RSV testing.  Fact Sheet for Patients: BloggerCourse.com  Fact Sheet for Healthcare Providers: SeriousBroker.it  This test is not yet approved or cleared by the [place] FDA and has been authorized for detection and/or diagnosis of SARS-CoV-2 by FDA under an Emergency Use Authorization (EUA). This EUA will remain in effect (meaning this test can be used) for the duration of the COVID-19 declaration under Section 564(b)(1) of the Act, 21 U.S.C. section 360bbb-3(b)(1), unless the authorization is terminated or revoked.     Resp Syncytial Virus by PCR NEGATIVE NEGATIVE Final    Comment: (NOTE) Fact Sheet for Patients: BloggerCourse.com  Fact Sheet for Healthcare Providers: SeriousBroker.it  This test is not yet approved or cleared by the [place] FDA and has been authorized for detection and/or diagnosis of SARS-CoV-2 by FDA under an Emergency Use Authorization (EUA). This EUA will remain in effect (meaning this test can be used) for the duration of the COVID-19 declaration under Section 564(b)(1) of the Act, 21 U.S.C. section 360bbb-3(b)(1), unless the authorization is terminated or revoked.  Performed at [facility], [address] [place], [place] [address]  Blood Culture (routine x 2)     Status: None   Collection Time: 09/10/23 12:43 AM   Specimen: BLOOD LEFT ARM  Result Value Ref Range Status   Specimen Description BLOOD LEFT ARM  Final   Special Requests   Final    BOTTLES DRAWN AEROBIC AND ANAEROBIC Blood Culture adequate volume   Culture   Final    NO  GROWTH 5 DAYS Performed at [facility], [address] [place], [place] [address]    Report Status 09/15/2023 FINAL  Final  Blood Culture (routine x 2)     Status: None   Collection Time: 09/10/23 12:43 AM   Specimen: BLOOD LEFT ARM  Result Value Ref Range Status   Specimen Description BLOOD LEFT ARM  Final   Special Requests   Final    BOTTLES DRAWN AEROBIC AND ANAEROBIC Blood Culture results may not be optimal due to an inadequate volume of blood received in culture bottles   Culture   Final    NO GROWTH 5 DAYS Performed at [facility], [address] [place], [place] [address]    Report Status 09/15/2023 FINAL  Final    Labs: CBC: Recent Labs  Lab 09/12/23 0843 09/13/23 0639 09/14/23 0640 09/18/23 0656  WBC 8.4 8.9 8.1 9.3  NEUTROABS 5.2 4.6 3.3  --   HGB 14.4 13.9 13.6 12.5*  HCT 41.3 39.8 38.8* 36.9*  MCV 98.8 100.5* 99.2 101.9*  PLT 124* 141* 159 206   Basic Metabolic Panel: Recent Labs  Lab 09/12/23 0843 09/13/23 0639 09/14/23 0640 09/18/23 0656  NA 137 134* 137 136  K 2.8* 4.0 3.9 4.5  CL 101 102 103 102  CO2 26 25 26 27   GLUCOSE 82 85 88 99  BUN 18 25* 24* 21  CREATININE 0.93 1.22 1.08 0.97  CALCIUM 8.6* 8.5* 8.7* 8.7*  MG 2.5* 2.5* 2.4  --   PHOS 3.3 3.7 4.4  --    Liver Function Tests: Recent Labs  Lab 09/12/23 0843 09/13/23 0639 09/14/23 0640  AST 23 29 31   ALT 17 23 26   ALKPHOS 39 47 51  BILITOT 0.8 0.5 0.4  PROT 6.6 6.5 6.4*  ALBUMIN 2.8* 2.6* 2.5*   CBG: Recent Labs  Lab 09/17/23 2125  GLUCAP 167*    Discharge time spent: greater than 30 minutes.  Signed: Alford Highland, MD [facility] 09/19/2023

## 2023-09-19 NOTE — Plan of Care (Signed)
  Problem: Respiratory: Goal: Will maintain a patent airway Outcome: Progressing   Problem: Respiratory: Goal: Complications related to the disease process, condition or treatment will be avoided or minimized Outcome: Progressing   Problem: Activity: Goal: Risk for activity intolerance will decrease Outcome: Progressing   Problem: Nutrition: Goal: Adequate nutrition will be maintained Outcome: Progressing   Problem: Safety: Goal: Ability to remain free from injury will improve Outcome: Progressing   Problem: Skin Integrity: Goal: Risk for impaired skin integrity will decrease Outcome: Progressing

## 2023-09-20 DIAGNOSIS — F02818 Dementia in other diseases classified elsewhere, unspecified severity, with other behavioral disturbance: Secondary | ICD-10-CM | POA: Diagnosis not present

## 2023-09-20 DIAGNOSIS — F01518 Vascular dementia, unspecified severity, with other behavioral disturbance: Secondary | ICD-10-CM | POA: Diagnosis not present

## 2023-09-20 DIAGNOSIS — G4700 Insomnia, unspecified: Secondary | ICD-10-CM | POA: Diagnosis not present

## 2023-09-20 DIAGNOSIS — R531 Weakness: Secondary | ICD-10-CM | POA: Diagnosis not present

## 2023-09-20 DIAGNOSIS — D696 Thrombocytopenia, unspecified: Secondary | ICD-10-CM | POA: Diagnosis not present

## 2023-09-20 DIAGNOSIS — U071 COVID-19: Secondary | ICD-10-CM | POA: Diagnosis not present

## 2023-09-20 DIAGNOSIS — R339 Retention of urine, unspecified: Secondary | ICD-10-CM | POA: Diagnosis not present

## 2023-09-20 DIAGNOSIS — M6281 Muscle weakness (generalized): Secondary | ICD-10-CM | POA: Diagnosis not present

## 2023-09-20 DIAGNOSIS — G309 Alzheimer's disease, unspecified: Secondary | ICD-10-CM | POA: Diagnosis not present

## 2023-09-20 DIAGNOSIS — I10 Essential (primary) hypertension: Secondary | ICD-10-CM | POA: Diagnosis not present

## 2023-09-20 DIAGNOSIS — R2689 Other abnormalities of gait and mobility: Secondary | ICD-10-CM | POA: Diagnosis not present

## 2023-09-22 DIAGNOSIS — R531 Weakness: Secondary | ICD-10-CM | POA: Diagnosis not present

## 2023-09-22 DIAGNOSIS — F01518 Vascular dementia, unspecified severity, with other behavioral disturbance: Secondary | ICD-10-CM | POA: Diagnosis not present

## 2023-09-22 DIAGNOSIS — I10 Essential (primary) hypertension: Secondary | ICD-10-CM | POA: Diagnosis not present

## 2023-09-22 DIAGNOSIS — U071 COVID-19: Secondary | ICD-10-CM | POA: Diagnosis not present

## 2023-09-22 DIAGNOSIS — G4700 Insomnia, unspecified: Secondary | ICD-10-CM | POA: Diagnosis not present

## 2023-09-22 DIAGNOSIS — R339 Retention of urine, unspecified: Secondary | ICD-10-CM | POA: Diagnosis not present

## 2023-09-22 DIAGNOSIS — D696 Thrombocytopenia, unspecified: Secondary | ICD-10-CM | POA: Diagnosis not present

## 2023-09-23 DIAGNOSIS — M6281 Muscle weakness (generalized): Secondary | ICD-10-CM | POA: Diagnosis not present

## 2023-09-23 DIAGNOSIS — R2689 Other abnormalities of gait and mobility: Secondary | ICD-10-CM | POA: Diagnosis not present

## 2023-09-23 DIAGNOSIS — G309 Alzheimer's disease, unspecified: Secondary | ICD-10-CM | POA: Diagnosis not present

## 2023-09-23 DIAGNOSIS — U071 COVID-19: Secondary | ICD-10-CM | POA: Diagnosis not present

## 2023-09-23 DIAGNOSIS — F01518 Vascular dementia, unspecified severity, with other behavioral disturbance: Secondary | ICD-10-CM | POA: Diagnosis not present

## 2023-09-23 DIAGNOSIS — F02818 Dementia in other diseases classified elsewhere, unspecified severity, with other behavioral disturbance: Secondary | ICD-10-CM | POA: Diagnosis not present

## 2023-09-26 DIAGNOSIS — I10 Essential (primary) hypertension: Secondary | ICD-10-CM | POA: Diagnosis not present

## 2023-09-26 DIAGNOSIS — R531 Weakness: Secondary | ICD-10-CM | POA: Diagnosis not present

## 2023-09-26 DIAGNOSIS — D696 Thrombocytopenia, unspecified: Secondary | ICD-10-CM | POA: Diagnosis not present

## 2023-09-26 DIAGNOSIS — R339 Retention of urine, unspecified: Secondary | ICD-10-CM | POA: Diagnosis not present

## 2023-09-26 DIAGNOSIS — F01518 Vascular dementia, unspecified severity, with other behavioral disturbance: Secondary | ICD-10-CM | POA: Diagnosis not present

## 2023-09-26 DIAGNOSIS — U071 COVID-19: Secondary | ICD-10-CM | POA: Diagnosis not present

## 2023-09-26 DIAGNOSIS — G4700 Insomnia, unspecified: Secondary | ICD-10-CM | POA: Diagnosis not present

## 2023-09-28 DIAGNOSIS — I10 Essential (primary) hypertension: Secondary | ICD-10-CM | POA: Diagnosis not present

## 2023-09-28 DIAGNOSIS — D696 Thrombocytopenia, unspecified: Secondary | ICD-10-CM | POA: Diagnosis not present

## 2023-09-28 DIAGNOSIS — F01518 Vascular dementia, unspecified severity, with other behavioral disturbance: Secondary | ICD-10-CM | POA: Diagnosis not present

## 2023-09-28 DIAGNOSIS — G4700 Insomnia, unspecified: Secondary | ICD-10-CM | POA: Diagnosis not present

## 2023-09-28 DIAGNOSIS — U071 COVID-19: Secondary | ICD-10-CM | POA: Diagnosis not present

## 2023-09-28 DIAGNOSIS — R339 Retention of urine, unspecified: Secondary | ICD-10-CM | POA: Diagnosis not present

## 2023-09-28 DIAGNOSIS — R531 Weakness: Secondary | ICD-10-CM | POA: Diagnosis not present

## 2023-09-29 DIAGNOSIS — I10 Essential (primary) hypertension: Secondary | ICD-10-CM | POA: Diagnosis not present

## 2023-09-29 DIAGNOSIS — G4700 Insomnia, unspecified: Secondary | ICD-10-CM | POA: Diagnosis not present

## 2023-09-29 DIAGNOSIS — U071 COVID-19: Secondary | ICD-10-CM | POA: Diagnosis not present

## 2023-09-29 DIAGNOSIS — R531 Weakness: Secondary | ICD-10-CM | POA: Diagnosis not present

## 2023-09-29 DIAGNOSIS — D696 Thrombocytopenia, unspecified: Secondary | ICD-10-CM | POA: Diagnosis not present

## 2023-09-29 DIAGNOSIS — F01518 Vascular dementia, unspecified severity, with other behavioral disturbance: Secondary | ICD-10-CM | POA: Diagnosis not present

## 2023-09-29 DIAGNOSIS — R339 Retention of urine, unspecified: Secondary | ICD-10-CM | POA: Diagnosis not present

## 2023-09-30 DIAGNOSIS — U071 COVID-19: Secondary | ICD-10-CM | POA: Diagnosis not present

## 2023-09-30 DIAGNOSIS — D696 Thrombocytopenia, unspecified: Secondary | ICD-10-CM | POA: Diagnosis not present

## 2023-09-30 DIAGNOSIS — F01518 Vascular dementia, unspecified severity, with other behavioral disturbance: Secondary | ICD-10-CM | POA: Diagnosis not present

## 2023-09-30 DIAGNOSIS — R339 Retention of urine, unspecified: Secondary | ICD-10-CM | POA: Diagnosis not present

## 2023-09-30 DIAGNOSIS — I10 Essential (primary) hypertension: Secondary | ICD-10-CM | POA: Diagnosis not present

## 2023-09-30 DIAGNOSIS — G4700 Insomnia, unspecified: Secondary | ICD-10-CM | POA: Diagnosis not present

## 2023-09-30 DIAGNOSIS — R531 Weakness: Secondary | ICD-10-CM | POA: Diagnosis not present

## 2023-10-01 ENCOUNTER — Other Ambulatory Visit: Payer: Self-pay | Admitting: Primary Care

## 2023-10-01 DIAGNOSIS — E785 Hyperlipidemia, unspecified: Secondary | ICD-10-CM

## 2023-10-04 DIAGNOSIS — R339 Retention of urine, unspecified: Secondary | ICD-10-CM | POA: Diagnosis not present

## 2023-10-04 DIAGNOSIS — F01518 Vascular dementia, unspecified severity, with other behavioral disturbance: Secondary | ICD-10-CM | POA: Diagnosis not present

## 2023-10-04 DIAGNOSIS — U071 COVID-19: Secondary | ICD-10-CM | POA: Diagnosis not present

## 2023-10-04 DIAGNOSIS — I10 Essential (primary) hypertension: Secondary | ICD-10-CM | POA: Diagnosis not present

## 2023-10-04 DIAGNOSIS — D696 Thrombocytopenia, unspecified: Secondary | ICD-10-CM | POA: Diagnosis not present

## 2023-10-04 DIAGNOSIS — R531 Weakness: Secondary | ICD-10-CM | POA: Diagnosis not present

## 2023-10-04 DIAGNOSIS — G4700 Insomnia, unspecified: Secondary | ICD-10-CM | POA: Diagnosis not present

## 2023-10-06 ENCOUNTER — Ambulatory Visit: Payer: Medicare Other | Admitting: Dermatology

## 2023-10-06 DIAGNOSIS — F02818 Dementia in other diseases classified elsewhere, unspecified severity, with other behavioral disturbance: Secondary | ICD-10-CM | POA: Diagnosis not present

## 2023-10-06 DIAGNOSIS — I639 Cerebral infarction, unspecified: Secondary | ICD-10-CM | POA: Diagnosis not present

## 2023-10-06 DIAGNOSIS — F01518 Vascular dementia, unspecified severity, with other behavioral disturbance: Secondary | ICD-10-CM | POA: Diagnosis not present

## 2023-10-06 DIAGNOSIS — Z8616 Personal history of COVID-19: Secondary | ICD-10-CM | POA: Diagnosis not present

## 2023-10-06 DIAGNOSIS — M542 Cervicalgia: Secondary | ICD-10-CM | POA: Diagnosis not present

## 2023-10-06 DIAGNOSIS — G309 Alzheimer's disease, unspecified: Secondary | ICD-10-CM | POA: Diagnosis not present

## 2023-10-11 DIAGNOSIS — I10 Essential (primary) hypertension: Secondary | ICD-10-CM | POA: Diagnosis not present

## 2023-10-11 DIAGNOSIS — D696 Thrombocytopenia, unspecified: Secondary | ICD-10-CM | POA: Diagnosis not present

## 2023-10-11 DIAGNOSIS — R531 Weakness: Secondary | ICD-10-CM | POA: Diagnosis not present

## 2023-10-11 DIAGNOSIS — R339 Retention of urine, unspecified: Secondary | ICD-10-CM | POA: Diagnosis not present

## 2023-10-11 DIAGNOSIS — G4700 Insomnia, unspecified: Secondary | ICD-10-CM | POA: Diagnosis not present

## 2023-10-11 DIAGNOSIS — U071 COVID-19: Secondary | ICD-10-CM | POA: Diagnosis not present

## 2023-10-11 DIAGNOSIS — F01518 Vascular dementia, unspecified severity, with other behavioral disturbance: Secondary | ICD-10-CM | POA: Diagnosis not present

## 2023-10-18 DIAGNOSIS — U071 COVID-19: Secondary | ICD-10-CM | POA: Diagnosis not present

## 2023-10-18 DIAGNOSIS — F02818 Dementia in other diseases classified elsewhere, unspecified severity, with other behavioral disturbance: Secondary | ICD-10-CM | POA: Diagnosis not present

## 2023-10-18 DIAGNOSIS — R531 Weakness: Secondary | ICD-10-CM | POA: Diagnosis not present

## 2023-10-18 DIAGNOSIS — G4700 Insomnia, unspecified: Secondary | ICD-10-CM | POA: Diagnosis not present

## 2023-10-18 DIAGNOSIS — R2689 Other abnormalities of gait and mobility: Secondary | ICD-10-CM | POA: Diagnosis not present

## 2023-10-18 DIAGNOSIS — G309 Alzheimer's disease, unspecified: Secondary | ICD-10-CM | POA: Diagnosis not present

## 2023-10-18 DIAGNOSIS — M6281 Muscle weakness (generalized): Secondary | ICD-10-CM | POA: Diagnosis not present

## 2023-10-18 DIAGNOSIS — I10 Essential (primary) hypertension: Secondary | ICD-10-CM | POA: Diagnosis not present

## 2023-10-18 DIAGNOSIS — F01518 Vascular dementia, unspecified severity, with other behavioral disturbance: Secondary | ICD-10-CM | POA: Diagnosis not present

## 2023-10-21 DIAGNOSIS — U071 COVID-19: Secondary | ICD-10-CM | POA: Diagnosis not present

## 2023-10-21 DIAGNOSIS — G309 Alzheimer's disease, unspecified: Secondary | ICD-10-CM | POA: Diagnosis not present

## 2023-10-21 DIAGNOSIS — F02818 Dementia in other diseases classified elsewhere, unspecified severity, with other behavioral disturbance: Secondary | ICD-10-CM | POA: Diagnosis not present

## 2023-10-21 DIAGNOSIS — F01518 Vascular dementia, unspecified severity, with other behavioral disturbance: Secondary | ICD-10-CM | POA: Diagnosis not present

## 2023-10-21 DIAGNOSIS — R2689 Other abnormalities of gait and mobility: Secondary | ICD-10-CM | POA: Diagnosis not present

## 2023-10-21 DIAGNOSIS — M6281 Muscle weakness (generalized): Secondary | ICD-10-CM | POA: Diagnosis not present

## 2023-10-25 DIAGNOSIS — U071 COVID-19: Secondary | ICD-10-CM | POA: Diagnosis not present

## 2023-10-25 DIAGNOSIS — F01518 Vascular dementia, unspecified severity, with other behavioral disturbance: Secondary | ICD-10-CM | POA: Diagnosis not present

## 2023-10-25 DIAGNOSIS — R2689 Other abnormalities of gait and mobility: Secondary | ICD-10-CM | POA: Diagnosis not present

## 2023-10-25 DIAGNOSIS — M6281 Muscle weakness (generalized): Secondary | ICD-10-CM | POA: Diagnosis not present

## 2023-10-25 DIAGNOSIS — F02818 Dementia in other diseases classified elsewhere, unspecified severity, with other behavioral disturbance: Secondary | ICD-10-CM | POA: Diagnosis not present

## 2023-10-25 DIAGNOSIS — G309 Alzheimer's disease, unspecified: Secondary | ICD-10-CM | POA: Diagnosis not present

## 2023-10-27 DIAGNOSIS — G4700 Insomnia, unspecified: Secondary | ICD-10-CM | POA: Diagnosis not present

## 2023-10-27 DIAGNOSIS — R531 Weakness: Secondary | ICD-10-CM | POA: Diagnosis not present

## 2023-10-27 DIAGNOSIS — I10 Essential (primary) hypertension: Secondary | ICD-10-CM | POA: Diagnosis not present

## 2023-10-27 DIAGNOSIS — F01518 Vascular dementia, unspecified severity, with other behavioral disturbance: Secondary | ICD-10-CM | POA: Diagnosis not present

## 2023-11-04 ENCOUNTER — Telehealth: Payer: Self-pay | Admitting: Primary Care

## 2023-11-04 DIAGNOSIS — M6281 Muscle weakness (generalized): Secondary | ICD-10-CM | POA: Diagnosis not present

## 2023-11-04 DIAGNOSIS — G309 Alzheimer's disease, unspecified: Secondary | ICD-10-CM | POA: Diagnosis not present

## 2023-11-04 DIAGNOSIS — F02818 Dementia in other diseases classified elsewhere, unspecified severity, with other behavioral disturbance: Secondary | ICD-10-CM | POA: Diagnosis not present

## 2023-11-04 DIAGNOSIS — U071 COVID-19: Secondary | ICD-10-CM | POA: Diagnosis not present

## 2023-11-04 DIAGNOSIS — F01518 Vascular dementia, unspecified severity, with other behavioral disturbance: Secondary | ICD-10-CM | POA: Diagnosis not present

## 2023-11-04 DIAGNOSIS — R2689 Other abnormalities of gait and mobility: Secondary | ICD-10-CM | POA: Diagnosis not present

## 2023-11-04 NOTE — Telephone Encounter (Signed)
 Copied from CRM [id number]. Topic: Referral - Request for Referral >> Nov 04, 2023  4:06 PM [place] J wrote: Lynden Ang calling in from [employer]. A new order for start of care needs to be sent in for April 7th. She has not been able to get in touch with patient for evaluation which is why a new order is needed.

## 2023-11-04 NOTE — Telephone Encounter (Signed)
Noted, approved. °

## 2023-11-07 ENCOUNTER — Other Ambulatory Visit: Payer: Self-pay | Admitting: *Deleted

## 2023-11-07 ENCOUNTER — Telehealth: Payer: Self-pay | Admitting: Primary Care

## 2023-11-07 DIAGNOSIS — I10 Essential (primary) hypertension: Secondary | ICD-10-CM

## 2023-11-07 NOTE — Telephone Encounter (Signed)
 Please call patient:  I can see him this Friday in the afternoon, I don't have any morning appointments remaninig this week.

## 2023-11-07 NOTE — Telephone Encounter (Signed)
 Have we sent new order? Don't think they are asking for verbal.

## 2023-11-07 NOTE — Patient Outreach (Signed)
 Confirmed with [facility] social worker, Kurt Bailey discharged from SNF on 11/05/23. [facility] arranged.   Screening for potential complex care management services as benefit of health plan and primary care provider.  Will refer to [facility] complex care management team. Kurt Bailey has history of falls, recent COVID, HTN, HLD.   Raiford Noble, MSN, RN, BSN [place]  [facility], [facility] RN Post- Acute Care Manager Direct Dial: [phone]

## 2023-11-07 NOTE — Telephone Encounter (Signed)
 Copied from CRM [id number]. Topic: Appointments - Scheduling Inquiry for Clinic >> Nov 07, 2023  1:39 PM Geroge Baseman wrote: Reason for CRM: Patient needs to be seen within the next week or so, per rehab facility discharge. Patient needs a morning appointment and none were available. Please call to advise on how they should proceed.

## 2023-11-07 NOTE — Telephone Encounter (Signed)
 I'm happy to help, but I don't know if they will accept my referral without a face to face visit. We can try. What's the name of the place they are requesting?

## 2023-11-07 NOTE — Telephone Encounter (Signed)
 Copied from CRM [id number]. Topic: General - Other >> Nov 04, 2023  4:12 PM Truddie Crumble wrote: Reason for CRM: patient wife Ezequiel Kayser) called stating they need to set up home care at a another location. The doctor was ordering it and the wife want to know if the rehab place across from [employer] in [place] would work [phone]

## 2023-11-08 ENCOUNTER — Telehealth: Payer: Self-pay | Admitting: *Deleted

## 2023-11-08 NOTE — Telephone Encounter (Signed)
 Left message to return call to our office.

## 2023-11-08 NOTE — Progress Notes (Signed)
 Complex Care Management Note  Care Guide Note 11/08/2023 Name: KINLEY FERRENTINO MRN: [id number] DOB: 1945-11-03  Ephriam Knuckles is a 78 y.o. year old male who sees Doreene Nest, NP for primary care. I reached out to Ephriam Knuckles by phone today to offer complex care management services.  Mr. Laymon and spouse Umair Rosiles was given information about Complex Care Management services today including:   The Complex Care Management services include support from the care team which includes your Nurse Care Manager, Clinical Social Worker, or Pharmacist.  The Complex Care Management team is here to help remove barriers to the health concerns and goals most important to you. Complex Care Management services are voluntary, and the patient may decline or stop services at any time by request to their care team member.   Complex Care Management Consent Status: Patient and spouse Tayari Yankee agreed to services and verbal consent obtained.   Follow up plan:  Telephone appointment with complex care management team member scheduled for:  11/09/23  Encounter Outcome:  Patient Scheduled  Gwenevere Ghazi  [facility]  [facility], [facility] Guide  Direct Dial: [phone]  Fax [phone]

## 2023-11-08 NOTE — Telephone Encounter (Signed)
 [email or web address]

## 2023-11-08 NOTE — Telephone Encounter (Signed)
 E2C2 called to find out why the patient had called, informed patient of message below, Jae Dire has afternoon appointments at this time

## 2023-11-09 ENCOUNTER — Other Ambulatory Visit: Payer: Self-pay

## 2023-11-09 NOTE — Patient Outreach (Signed)
 Complex Care Management   Visit Note  11/09/2023  Name:  Kurt Bailey MRN: [id number] DOB: 03/27/1946  Situation: Referral received for Complex Care Management related to Dementia I obtained verbal consent from caregiver and patient.  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  Medications Reviewed Today     Reviewed by Sherrye Payor, RN (Registered Nurse) on 11/09/23 at 1159  Med List Status: <None>   Medication Order Taking? Sig Documenting Provider Last Dose Status Informant  acetaminophen (TYLENOL) 500 MG tablet [id number] Yes Take 500 mg by mouth every 6 (six) hours as needed. [provider] Taking Active Spouse/Significant Other, Child  atorvastatin (LIPITOR) 40 MG tablet [id number] Yes TAKE 1 TABLET BY MOUTH ONCE DAILY FOR CHOLESTEROL Doreene Nest, NP Taking Active   cyanocobalamin 1000 MCG tablet [id number] Yes Take by mouth. [provider] Taking Active Child   dextromethorphan-guaiFENesin (MUCINEX DM) 30-600 MG 12hr tablet [id number]  Take 1 tablet by mouth 2 (two) times daily as needed for cough. Alford Highland, MD  Active   divalproex (DEPAKOTE) 500 MG DR tablet [id number] Yes Take 500 mg by mouth 2 (two) times daily. [provider] Taking Active Child  feeding supplement (ENSURE ENLIVE / ENSURE PLUS) LIQD [id number] No Take 237 mLs by mouth 3 (three) times daily between meals.  Patient not taking: Reported on 11/09/2023   Alford Highland, MD Not Taking Active   Ipratropium-Albuterol (COMBIVENT RESPIMAT) 20-100 MCG/ACT AERS respimat [id number]  Inhale 1 puff into the lungs every 6 (six) hours as needed for wheezing. Alford Highland, MD  Expired 10/19/23 2359   memantine (NAMENDA) 10 MG tablet [id number]  Take 10 mg by mouth 2 (two) times daily.  [provider]  Expired 09/10/23 2359 Spouse/Significant Other, Child  Multiple Vitamin (MULTIVITAMIN WITH MINERALS) TABS tablet [id number]  Take 1 tablet by mouth daily. Alford Highland, MD  Active   Omega-3 1000 MG CAPS [id number] Yes Take 1,000 mg by mouth every evening.  [provider] Taking Active Spouse/Significant Other, Child  tamsulosin (FLOMAX) 0.4 MG CAPS capsule [id number] No Take 1 capsule (0.4 mg total) by mouth daily after supper.  Patient not taking: Reported on 11/09/2023   Alford Highland, MD Not Taking Active   traZODone (DESYREL) 50 MG tablet [id number] No Take 0.5 tablets (25 mg total) by mouth at bedtime.  Patient not taking: Reported on 11/09/2023   Alford Highland, MD Not Taking Active   vitamin B-12 (CYANOCOBALAMIN) 1000 MCG tablet [id number]  Yes Take 1,000 mcg by mouth every evening.  [provider] Taking Active Spouse/Significant Other, Child            Recommendation:   Caregiver is inquiring if patient should still be taking tamsulosin and trazodone, she suspects these two meds were prescribed at SNF stay   Follow Up Plan:   Telephone  follow-up Thursday, April 24th at 2:45pm.   Jeani Hawking BSN, CCM [place]  [facility] Population Health RN Care Manager Direct Dial: [phone]  Fax: [phone]

## 2023-11-09 NOTE — Patient Instructions (Addendum)
 Visit Information  Thank you for taking time to visit with me today. Please don't hesitate to contact me if I can be of assistance to you before our next scheduled appointment.  Our next appointment is by telephone on Thursday, April 24th at 2:45pm Please call the care guide team at [phone] if you need to cancel or reschedule your appointment.   I have inserted an education sheet on Alzheimer's Disease Caregiver Guide - if you have a computer, there is also great information at [email or web address].    Following is a copy of your care plan:   Goals Addressed             This Visit's Progress    VBCI RN Care Plan       Problems:  Chronic Disease Management support and education needs related to Dementia  Goal: Over the next 6 months the Caregiver Patient will continue to work with RN Care Manager and/or Social Worker to address care management and care coordination needs related to Dementia as evidenced by adherence to CM Team Scheduled appointments      Interventions:   Dementia:  (Status:  New goal.)  Long Term Goal Evaluation of current treatment plan related to misuse of: Alzheimer's dementia PHQ2/ PHQ9 completed, Emotional Support Provided to patient/caregiver, Consideration of in-home help encouraged , and Advised to contact provider for new or worsening symptoms  Patient Self-Care Activities:  Attend all scheduled provider appointments Call provider office for new concerns or questions   Plan:  Follow up with provider re: trazodone and tamsulosin (this is on patient's med list but caregiver states patient is not taking/medications not in the home).  Telephone follow up appointment with care management team member scheduled for:  Thursday, April 24th at 2:45pm.  The patient has been provided with contact information for the care management team and has been advised to call with any health related questions or concerns.              Please call the Suicide and  Crisis Lifeline: 988 if you are experiencing a Mental Health or Behavioral Health Crisis or need someone to talk to.  The patient verbalized understanding of instructions, educational materials, and care plan provided today and agreed to receive a mailed copy of patient instructions, educational materials, and care plan.   Jeani Hawking BSN, CCM [place]  [facility] Population Health RN Care Manager Direct Dial: [phone]  Fax: [phone]

## 2023-11-09 NOTE — Telephone Encounter (Signed)
Unable to reach patients wife. Left voicemail to return call to our office.

## 2023-11-10 NOTE — Telephone Encounter (Signed)
Unable to reach patients wife. Left voicemail to return call to our office.

## 2023-11-14 NOTE — Telephone Encounter (Signed)
 Unable to reach patient spouse. Left voicemail to return call to our office.   4th attempt to reach patient wife. Will wait for her to return call to office.

## 2023-11-14 NOTE — Telephone Encounter (Signed)
 Noted. Looks like he is scheduled for 04/16

## 2023-11-16 ENCOUNTER — Encounter: Payer: Self-pay | Admitting: Primary Care

## 2023-11-16 ENCOUNTER — Ambulatory Visit: Admitting: Primary Care

## 2023-11-16 VITALS — BP 126/82 | HR 78 | Temp 97.8°F | Ht 68.0 in | Wt 192.0 lb

## 2023-11-16 DIAGNOSIS — F01518 Vascular dementia, unspecified severity, with other behavioral disturbance: Secondary | ICD-10-CM

## 2023-11-16 DIAGNOSIS — G309 Alzheimer's disease, unspecified: Secondary | ICD-10-CM

## 2023-11-16 DIAGNOSIS — I10 Essential (primary) hypertension: Secondary | ICD-10-CM

## 2023-11-16 DIAGNOSIS — E785 Hyperlipidemia, unspecified: Secondary | ICD-10-CM | POA: Diagnosis not present

## 2023-11-16 DIAGNOSIS — R7303 Prediabetes: Secondary | ICD-10-CM

## 2023-11-16 DIAGNOSIS — Z8616 Personal history of COVID-19: Secondary | ICD-10-CM

## 2023-11-16 DIAGNOSIS — F02818 Dementia in other diseases classified elsewhere, unspecified severity, with other behavioral disturbance: Secondary | ICD-10-CM

## 2023-11-16 DIAGNOSIS — E538 Deficiency of other specified B group vitamins: Secondary | ICD-10-CM | POA: Diagnosis not present

## 2023-11-16 DIAGNOSIS — G4700 Insomnia, unspecified: Secondary | ICD-10-CM | POA: Diagnosis not present

## 2023-11-16 DIAGNOSIS — R2681 Unsteadiness on feet: Secondary | ICD-10-CM | POA: Diagnosis not present

## 2023-11-16 LAB — CBC
HCT: 41.6 % (ref 39.0–52.0)
Hemoglobin: 13.9 g/dL (ref 13.0–17.0)
MCHC: 33.5 g/dL (ref 30.0–36.0)
MCV: 104.7 fl — ABNORMAL HIGH (ref 78.0–100.0)
Platelets: 210 10*3/uL (ref 150.0–400.0)
RBC: 3.97 Mil/uL — ABNORMAL LOW (ref 4.22–5.81)
RDW: 14 % (ref 11.5–15.5)
WBC: 9.1 10*3/uL (ref 4.0–10.5)

## 2023-11-16 LAB — COMPREHENSIVE METABOLIC PANEL WITH GFR
ALT: 11 U/L (ref 0–53)
AST: 16 U/L (ref 0–37)
Albumin: 3.9 g/dL (ref 3.5–5.2)
Alkaline Phosphatase: 59 U/L (ref 39–117)
BUN: 12 mg/dL (ref 6–23)
CO2: 29 meq/L (ref 19–32)
Calcium: 8.7 mg/dL (ref 8.4–10.5)
Chloride: 103 meq/L (ref 96–112)
Creatinine, Ser: 0.96 mg/dL (ref 0.40–1.50)
GFR: 76.74 mL/min (ref >60.00)
Glucose, Bld: 116 mg/dL — ABNORMAL HIGH (ref 70–99)
Potassium: 3.3 meq/L — ABNORMAL LOW (ref 3.5–5.1)
Sodium: 141 meq/L (ref 135–145)
Total Bilirubin: 0.5 mg/dL (ref 0.2–1.2)
Total Protein: 6.8 g/dL (ref 6.0–8.3)

## 2023-11-16 LAB — LIPID PANEL
Cholesterol: 105 mg/dL (ref 0–200)
HDL: 27.1 mg/dL — ABNORMAL LOW (ref >39.00)
LDL Cholesterol: 49 mg/dL (ref 0–99)
NonHDL: 77.69
Total CHOL/HDL Ratio: 4
Triglycerides: 145 mg/dL (ref 0.0–149.0)
VLDL: 29 mg/dL (ref 0.0–40.0)

## 2023-11-16 LAB — HEMOGLOBIN A1C: Hgb A1c MFr Bld: 5.9 % (ref 4.6–6.5)

## 2023-11-16 LAB — VITAMIN B12: Vitamin B-12: 966 pg/mL — ABNORMAL HIGH (ref 211–911)

## 2023-11-16 NOTE — Patient Instructions (Signed)
 Stop by the lab prior to leaving today. I will notify you of your results once received.   You will receive a call regarding the physical therapy referral.  It was a pleasure to see you today!

## 2023-11-16 NOTE — Progress Notes (Signed)
 Subjective:    Patient ID: Kurt Bailey, male    DOB: 04/15/1946, 78 y.o.   MRN: [id number]  HPI  Kurt Bailey is a very pleasant 78 y.o. male with a history of Alzheimer's dementia, hypertension, thrombocytopenia, prediabetes, generalized weakness who presents today for follow-up of chronic conditions.  His wife joins [place] today who provides information for HPI.  1) Alzheimer's Dementia: With multiple cortical infarcts and microvacscular ischemic changes.  Following with neurology, last office visit was in March 2025.  Currently managed on Namenda 10 mg twice daily and Depakote 500 mg twice daily.  No changes were made to his regimen.  His wife believes his memory is stable. He is sleeping well overall. Denies anxiety.   2) Hyperlipidemia: Currently managed on atorvastatin 40 mg daily. Last lipid panel was in August of 2024 with LDL of 53. He denies chest pain, shortness of breath.   3) History of Covid-19 infection: Hospitalization in February 2025 then discharged to [facility], discharged from [facility] on 11/05/23. We received a phone call from [facility] social worker requesting home health physical therapy.   His wife mentions that there is no room in his home for home health physical therapy. They are requesting outpatient physical therapy for overall balance and gait. Since his hospitalization he's felt imbalanced, has been using a walker. He denies falls.    BP Readings from Last 3 Encounters:  11/16/23 126/82  09/19/23 97/73  09/07/23 118/70      Review of Systems  Respiratory:  Negative for shortness of breath.   Cardiovascular:  Negative for chest pain.  Gastrointestinal:  Negative for constipation and diarrhea.  Neurological:  Negative for dizziness and headaches.  Psychiatric/Behavioral:  Negative for sleep disturbance. The patient is not nervous/anxious.          Past Medical History:  Diagnosis Date   Acute hypoxemic respiratory failure due  to COVID-19 [facility]) 05/13/2020   Acute urinary retention 09/14/2023   Alcohol abuse    Diverticulitis    Periumbilical hernia     Social History   Socioeconomic History   Marital status: Married    Spouse name: Not on file   Number of children: Not on file   Years of education: Not on file   Highest education level: Not on file  Occupational History   Not on file  Tobacco Use   Smoking status: Never   Smokeless tobacco: Never  Vaping Use   Vaping status: Never Used  Substance and Sexual Activity   Alcohol use: Not Currently   Drug use: Never   Sexual activity: Not on file  Other Topics Concern   Not on file  Social History Narrative   Married.   4 children.   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   Enjoys relaxing, walking.   Social Drivers of Corporate investment banker Strain: Low Risk  (09/07/2023)   Overall Financial Resource Strain (CARDIA)    Difficulty of Paying Living Expenses: Not hard at all  Food Insecurity: No Food Insecurity (11/09/2023)   Hunger Vital Sign    Worried About Running Out of Food in the Last Year: Never true    Ran Out of Food in the Last Year: Never true  Transportation Needs: No Transportation Needs (09/10/2023)   PRAPARE - Administrator, Civil Service (Medical): No    Lack of Transportation (Non-Medical): No  Physical Activity: Inactive (09/07/2023)   Exercise Vital Sign    Days  of Exercise per Week: 0 days    Minutes of Exercise per Session: 0 min  Stress: No Stress Concern Present (09/07/2023)   [employer] of Occupational Health - Occupational Stress Questionnaire    Feeling of Stress : Not at all  Social Connections: Unknown (09/10/2023)   Social Connection and Isolation Panel [NHANES]    Frequency of Communication with Friends and Family: Patient unable to answer    Frequency of Social Gatherings with Friends and Family: Patient unable to answer    Attends Religious Services: Patient unable to answer    Active Member of  Clubs or Organizations: Patient unable to answer    Attends Banker Meetings: Patient unable to answer    Marital Status: Married  Recent Concern: Social Connections - Socially Isolated (09/07/2023)   Social Connection and Isolation Panel [NHANES]    Frequency of Communication with Friends and Family: Never    Frequency of Social Gatherings with Friends and Family: Never    Attends Religious Services: Never    Database administrator or Organizations: No    Attends Banker Meetings: Never    Marital Status: Married  Catering manager Violence: Not At Risk (11/09/2023)   Humiliation, Afraid, Rape, and Kick questionnaire    Fear of Current or Ex-Partner: No    Emotionally Abused: No    Physically Abused: No    Sexually Abused: No    History reviewed. No pertinent surgical history.  Family History  Problem Relation Age of Onset   Alcohol abuse Father    Stroke Father    Hypertension Father    Emphysema Father    Breast cancer Sister    Heart attack Brother    Aneurysm Sister    Stroke Brother    Hyperlipidemia Brother     No Known Allergies  Current Outpatient Medications on File Prior to Visit  Medication Sig Dispense Refill   acetaminophen (TYLENOL) 500 MG tablet Take 500 mg by mouth every 6 (six) hours as needed.     atorvastatin (LIPITOR) 40 MG tablet TAKE 1 TABLET BY MOUTH ONCE DAILY FOR CHOLESTEROL 90 tablet 0   cyanocobalamin 1000 MCG tablet Take by mouth.     divalproex (DEPAKOTE) 500 MG DR tablet Take 500 mg by mouth 2 (two) times daily.     Omega-3 1000 MG CAPS Take 1,000 mg by mouth every evening.      vitamin B-12 (CYANOCOBALAMIN) 1000 MCG tablet Take 1,000 mcg by mouth every evening.      Ipratropium-Albuterol (COMBIVENT RESPIMAT) 20-100 MCG/ACT AERS respimat Inhale 1 puff into the lungs every 6 (six) hours as needed for wheezing. 1 each 0   memantine (NAMENDA) 10 MG tablet Take 10 mg by mouth 2 (two) times daily.      No current  facility-administered medications on file prior to visit.    BP 126/82   Pulse 78   Temp 97.8 F (36.6 C) (Temporal)   Ht 5\' 8"  (1.727 m)   Wt 192 lb (87.1 kg)   SpO2 94%   BMI 29.19 kg/m  Objective:   Physical Exam Cardiovascular:     Rate and Rhythm: Normal rate and regular rhythm.  Pulmonary:     Effort: Pulmonary effort is normal.     Breath sounds: Normal breath sounds.  Musculoskeletal:     Cervical back: Neck supple.  Skin:    General: Skin is warm and dry.  Neurological:     Mental Status: He is alert  and oriented to person, place, and time.  Psychiatric:        Mood and Affect: Mood normal.           Assessment & Plan:  Essential hypertension Assessment & Plan: Controlled.  Continue to monitor. Continue off medications.  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 Following with neurology, reviewed office notes from March 2025.  Continue Namenda 10 mg BID, Depakote 500 mg BID.    History of COVID-19 Assessment & Plan: With hospitalization in February 2025. Exam today benign.  Agree with outpatient physical therapy for imbalance and gait, referral placed.    Orders: -     Ambulatory referral to Physical Therapy  Hyperlipidemia, unspecified hyperlipidemia type Assessment & Plan: Repeat lipid panel pending. Continue atorvastatin 40 mg daily  Orders: -     Lipid panel  Insomnia, unspecified type Assessment & Plan: No concerns today. Remain off Trazodone.    Pre-diabetes Assessment & Plan: Repeat A1C pending.  Orders: -     CBC -     Hemoglobin A1c  Unsteady gait -     Ambulatory referral to Physical Therapy  Vitamin B 12 deficiency Assessment & Plan: Repeat B12 level pending.  Orders: -     Vitamin B12        Sherrika Weakland K Aleria Maheu, NP

## 2023-11-16 NOTE — Assessment & Plan Note (Signed)
Controlled.  Continue to monitor.  Continue off medications.  

## 2023-11-16 NOTE — Assessment & Plan Note (Signed)
 Stable per wife. Following with neurology, reviewed office notes from March 2025.  Continue Namenda 10 mg BID, Depakote 500 mg BID.

## 2023-11-16 NOTE — Assessment & Plan Note (Signed)
 Repeat A1C pending.

## 2023-11-16 NOTE — Assessment & Plan Note (Signed)
 Repeat lipid panel pending. Continue atorvastatin 40 mg daily.

## 2023-11-16 NOTE — Assessment & Plan Note (Signed)
 With hospitalization in February 2025. Exam today benign.  Agree with outpatient physical therapy for imbalance and gait, referral placed.

## 2023-11-16 NOTE — Assessment & Plan Note (Signed)
 Repeat B 12 level pending.

## 2023-11-16 NOTE — Assessment & Plan Note (Signed)
 No concerns today. Remain off Trazodone.

## 2023-11-17 ENCOUNTER — Telehealth: Payer: Self-pay

## 2023-11-17 NOTE — Telephone Encounter (Signed)
 Patient needs repeat potassium lab scheduled for 2 weeks. Please call patients wife to schedule.

## 2023-11-17 NOTE — Telephone Encounter (Signed)
 Called  pt and schedule a appt for labs

## 2023-11-21 ENCOUNTER — Other Ambulatory Visit: Payer: Self-pay | Admitting: Primary Care

## 2023-11-21 ENCOUNTER — Telehealth: Payer: Self-pay

## 2023-11-21 DIAGNOSIS — E876 Hypokalemia: Secondary | ICD-10-CM

## 2023-11-21 MED ORDER — POTASSIUM CHLORIDE CRYS ER 20 MEQ PO TBCR: 20.0000 meq | EXTENDED_RELEASE_TABLET | Freq: Two times a day (BID) | ORAL | 0 refills | Status: DC

## 2023-11-21 NOTE — Telephone Encounter (Signed)
 Copied from CRM [id number]. Topic: Clinical - Medication Question >> Nov 21, 2023 12:14 PM Adonis Hoot wrote: Reason for CRM: Patients wife called in to check on potassium prescription that was suppose to be sent in for patient.

## 2023-11-21 NOTE — Telephone Encounter (Signed)
 Called and advised patients wife that potassium prescription has not been sent in yet, message has been sent to Polly Brink, advised patients wife Polly Brink is out of office today. Advised patient we have not sent any prescription for blood pressure medication to the pharmacy.

## 2023-11-21 NOTE — Addendum Note (Signed)
 Addended by: Odella Appelhans K on: 11/21/2023 06:22 PM   Modules accepted: Orders

## 2023-11-21 NOTE — Telephone Encounter (Signed)
 Copied from CRM [id number]. Topic: Clinical - Prescription Issue >> Nov 21, 2023  1:13 PM Melissa C wrote: Reason for CRM: please contact patient's wife as she has run into some confusion regarding her husbands medication. She stated that potassium prescription was supposed to be called in for her husband because of his labs but when she contacted the pharmacy they had blood pressure medication for him and he isn't on blood pressure medication and they didn't have the potassium. Please advise with patient's wife to clear up the issue. Thank you.

## 2023-11-21 NOTE — Telephone Encounter (Addendum)
 Noted, potassium pills sent to pharmacy. Potassium lab ordered. He is not on BP medication. Is she referring to the cholesterol medication atorvastatin ?

## 2023-11-22 NOTE — Telephone Encounter (Signed)
 Left detailed message per DPR advising patient of Onalee Bien message.

## 2023-11-23 ENCOUNTER — Ambulatory Visit: Attending: Primary Care

## 2023-11-23 DIAGNOSIS — R2681 Unsteadiness on feet: Secondary | ICD-10-CM | POA: Insufficient documentation

## 2023-11-23 DIAGNOSIS — R262 Difficulty in walking, not elsewhere classified: Secondary | ICD-10-CM | POA: Diagnosis not present

## 2023-11-23 DIAGNOSIS — Z8616 Personal history of COVID-19: Secondary | ICD-10-CM | POA: Insufficient documentation

## 2023-11-23 DIAGNOSIS — R278 Other lack of coordination: Secondary | ICD-10-CM | POA: Diagnosis not present

## 2023-11-23 DIAGNOSIS — R2689 Other abnormalities of gait and mobility: Secondary | ICD-10-CM | POA: Insufficient documentation

## 2023-11-23 DIAGNOSIS — R269 Unspecified abnormalities of gait and mobility: Secondary | ICD-10-CM | POA: Diagnosis not present

## 2023-11-23 DIAGNOSIS — M6281 Muscle weakness (generalized): Secondary | ICD-10-CM | POA: Diagnosis not present

## 2023-11-23 NOTE — Therapy (Signed)
 OUTPATIENT PHYSICAL THERAPY NEURO EVALUATION   Patient Name: Kurt Bailey MRN: [id number] DOB:1946-04-19, 78 y.o., male Today's Date: 11/24/2023   PCP: Dr, Kurt Bailey REFERRING PROVIDER: Dr. Tretha Bailey  END OF SESSION:  PT End of Session - 11/23/23 0940     Visit Number 1    Number of Visits 25    Date for PT Re-Evaluation 02/15/24    Progress Note Due on Visit 10    PT Start Time 0931    PT Stop Time 1015    PT Time Calculation (min) 44 min    Equipment Utilized During Treatment Gait belt    Activity Tolerance Patient tolerated treatment well    Behavior During Therapy [facility] for tasks assessed/performed             Past Medical History:  Diagnosis Date   Acute hypoxemic respiratory failure due to COVID-19 (HCC) 05/13/2020   Acute urinary retention 09/14/2023   Alcohol abuse    Diverticulitis    Periumbilical hernia    History reviewed. No pertinent surgical history. 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) 09/19/2018    ONSET DATE: 09/09/2023  REFERRING DIAG:  Z86.16 (ICD-10-CM) - History of COVID-19  R26.81 (ICD-10-CM) - Unsteady gait    THERAPY DIAG:  Abnormality of gait and mobility  Difficulty in walking, not elsewhere classified  Muscle weakness (generalized)  Other abnormalities of gait and mobility  Other lack of coordination  Unsteadiness on feet  Rationale for Evaluation and Treatment: Rehabilitation  SUBJECTIVE:                                                                                                                                                                                              SUBJECTIVE STATEMENT: Patient reports some difficulty with walking but states feeling fine without much pain. Wife reports he is weaker since COVID in Jan 2025 and more unsteady with his walking.  Pt accompanied by: significant other-Kurt Bailey  PERTINENT HISTORY: Patient is a 78 year old male with past medical history significant for Vascular dementia with other behavorial disturbances- refrreral to PT for unsteady gait worsened since bout with COVID-19 on 09/09/2023- Per [facility] discharge note on  09/19/2023: Hospital Course: Kurt Bailey is 78 y.o. male with alcohol abuse, diverticulitis who presents to the ED with cough, chest congestion, dyspnea and myalgias.  Is also been having recurrent falls with generalized weakness per his daughter who brought him to the ER.  In the ED he was found to mildly tachycardic and hypertensive.  Labs mostly within normal limits.  Head CT is unremarkable, C-spine CT revealed severe degenerative changes with multilevel severe osseous neuroforaminal stenosis without acutely displaced fractures or traumatic listhesis.  Patient tested positive for COVID-19.  By reevaluation on 2/8 patient reported feeling weak but was overall all stable.  PAIN:  Are you having pain? No  PRECAUTIONS: Fall  RED FLAGS: None   WEIGHT BEARING RESTRICTIONS: No  FALLS: Has patient fallen in last 6 months? Yes. Number of falls 3  LIVING ENVIRONMENT: Lives with: lives with their family Lives in: House/apartment Stairs: Yes: External: 4 steps; can reach both Has following equipment at home: [name] - 2 wheeled and Grab bars  PLOF: Independent with community mobility with device, Needs assistance with ADLs, and Needs assistance with homemaking  PATIENT GOALS: Want to get around better.   OBJECTIVE:  Note: Objective measures were completed at  Evaluation unless otherwise noted.  DIAGNOSTIC FINDINGS: CLINICAL DATA:  Neuro deficit, acute, stroke suspected; Neck trauma (Age >= 65y) Pt to ed from home via [facility] for weakness   EXAM: CT HEAD WITHOUT CONTRAST   CT CERVICAL SPINE WITHOUT CONTRAST   TECHNIQUE: Multidetector CT imaging of the head and cervical spine was performed following the standard protocol without intravenous contrast. Multiplanar CT image reconstructions of the cervical spine were also generated.   RADIATION DOSE REDUCTION: This exam was performed according to the departmental dose-optimization program which includes automated exposure control, adjustment of the mA and/or kV according to patient size and/or use of iterative reconstruction technique.   COMPARISON:  MRI head 06/06/2023   FINDINGS: CT HEAD FINDINGS   Brain:   Cerebral ventricle sizes are concordant with the degree of cerebral volume loss. Patchy and confluent areas of decreased attenuation are noted throughout the deep and periventricular white matter of the cerebral hemispheres bilaterally, compatible with chronic microvascular ischemic disease. 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 No evidence of large-territorial acute infarction. No parenchymal hemorrhage. No mass lesion. No extra-axial collection.   No mass effect or midline shift. No hydrocephalus. Basilar cisterns are patent.   Vascular: No hyperdense vessel. Atherosclerotic calcifications are present within the cavernous internal carotid arteries.   Skull: No acute fracture or focal lesion.   Sinuses/Orbits: Bilateral maxillary, sphenoid, ethmoid sinus mucosal thickening. Otherwise paranasal sinuses and mastoid air cells are clear. The orbits are unremarkable.   Other: None.   CT CERVICAL SPINE FINDINGS   Alignment: Normal.   Skull base and vertebrae: Multilevel severe degenerative changes spine with associated multilevel  severe osseous neural foraminal stenosis. No severe osseous central canal stenosis. No acute fracture. No aggressive appearing focal osseous lesion or focal pathologic process.   Soft tissues and spinal canal: No prevertebral fluid or swelling. No visible canal hematoma.   Upper chest: Unremarkable.   Other: None.   IMPRESSION: 1. No acute intracranial abnormality. 2. No acute displaced fracture or traumatic listhesis of the cervical spine. 3. Multilevel severe degenerative changes spine with associated multilevel severe osseous neural foraminal stenosis.     Electronically Signed   By: Morgane  Naveau M.D.   On: 09/09/2023 21:42    COGNITION: Overall  cognitive status: History of cognitive impairments - at baseline   SENSATION: WFL  COORDINATION: Impaired.   EDEMA:  None observed   POSTURE: rounded shoulders and forward head  LOWER EXTREMITY ROM:     Active  Right Eval Left Eval  Hip flexion    Hip extension    Hip abduction    Hip adduction    Hip internal rotation    Hip external rotation    Knee flexion    Knee extension    Ankle dorsiflexion    Ankle plantarflexion    Ankle inversion    Ankle eversion     (Blank rows = not tested)  LOWER EXTREMITY MMT:    MMT Right Eval Left Eval  Hip flexion 4 4  Hip extension 4 4  Hip abduction 4 4  Hip adduction 4 4  Hip internal rotation 4 4  Hip external rotation 4 4  Knee flexion 4 4  Knee extension 4 4  Ankle dorsiflexion 3+ 4  Ankle plantarflexion    Ankle inversion    Ankle eversion    (Blank rows = not tested)  BED MOBILITY:  Not tested  TRANSFERS: Sit to stand: CGA  Assistive device utilized: None     Stand to sit: CGA  Assistive device utilized: None     Chair to chair: CGA  Assistive device utilized: None         STAIRS: Not tested GAIT: Findings: Gait Characteristics: decreased arm swing- Right, decreased arm swing- Left, decreased step length- Right, decreased step length-  Left, decreased stance time- Right, decreased stance time- Left, and decreased stride length, Distance walked: > 100 feet, Assistive device utilized:None, and Level of assistance: CGA  FUNCTIONAL TESTS:  5 times sit to stand: 15.71 sec without UE support Timed up and go (TUG): 14.98 and 15.71 sec without UE support =15.3 sec avg 6 minute walk test: To be assessed 2nd visit 10 meter walk test: 0.7 m/s avg without AD Berg Balance Scale: 42/56  PATIENT SURVEYS:  ABC scale To be assessed next visit                                                                                                                              TREATMENT DATE: 11/23/2023- PT EVALUATION Self care/home management- Education in balance and fall prevention. Initiated HEP (See below). Educated in safety with mobility including using assistive device per result of score on BERG balance test.    PATIENT EDUCATION: Education details: PT plan of care; Purpose of PT as it relates to helping patient with strength, balance, HEP, Safety with mobility Person educated: Patient and Spouse Education method: Explanation, Demonstration, Tactile cues, Verbal cues, and Handouts Education comprehension: verbalized understanding and needs further education  HOME EXERCISE PROGRAM: Access Code: [email or web address] URL: [email or web address] Date: 11/23/2023 Prepared by: Ferrell Hu  Exercises - Seated Toe Raise  - 3 x weekly - 3 sets - 10 reps - Seated Heel  Raise  - 3 x weekly - 3 sets - 10 reps - Standing Romberg to 1/2 Tandem Stance  - 3 x weekly - 3 sets - 30 sec hold  GOALS:  Goals reviewed with patient? Yes  SHORT TERM GOALS: Target date: 01/04/2024  Pt will be independent with HEP in order to improve strength and balance in order to decrease fall risk and improve function at home and work.  Baseline: EVAL- NO formal HEP in place Goal status: INITIAL   LONG TERM GOALS: Target date: 02/15/2024   1.  Patient (>  51 years old) will complete five times sit to stand test in < 15 seconds indicating an increased LE strength and improved balance. Baseline: EVAL: 15.71 sec without UE support Goal status: INITIAL  2.  Patient will improve ABC score by 9 points   to demonstrate statistically significant improvement in mobility and quality of life as it relates to their confidence in his balance.  Baseline: EVAL- To be assessed visit #2 Goal status: INITIAL   3.  Patient will increase Berg Balance score by > 6 points to demonstrate decreased fall risk during functional activities. Baseline: EVAL= 42/56 Goal status: INITIAL   4.   Patient will reduce timed up and go to <11 seconds to reduce fall risk and demonstrate improved transfer/gait ability. Baseline: 15.3 sec avg without AD Goal status: INITIAL  5.   Patient will increase 10 meter walk test to >1.20m/s as to improve gait speed for better community ambulation and to reduce fall risk. Baseline: EVAL= 0.7 m/s avg without AD Goal status: INITIAL  6.   Patient will increase six minute walk test distance to >1000 for progression to community ambulator and improve gait ability Baseline: To be assessed visit #2 Goal status: INITIAL    ASSESSMENT:  CLINICAL IMPRESSION: Patient is a 78 y.o. male who was seen today for physical therapy evaluation and treatment for unsteady gait. PT examination reveals deficits . Pt presents with deficits in strength as seen by decreased score on 5x STS in sex/age group norm, gait and balance  as seen by decreased scores in 10 MWT, TUG, and BERG - including increased risk of falling. Mr. Altice will benefit from skilled PT services to address these deficits in strength, mobility/balance and to improve his overall functional mobility and decrease risk for future falls.   OBJECTIVE IMPAIRMENTS: Abnormal gait, decreased activity tolerance, decreased balance, decreased cognition, decreased coordination, decreased endurance,  decreased knowledge of condition, decreased knowledge of use of DME, decreased mobility, difficulty walking, decreased strength, and postural dysfunction.   ACTIVITY LIMITATIONS: carrying, lifting, bending, sitting, standing, squatting, stairs, and transfers  PARTICIPATION LIMITATIONS: meal prep, cleaning, laundry, driving, shopping, community activity, and yard work  PERSONAL FACTORS: 1-2 comorbidities: Mixed Alzheimers and vascular dementia, HTN  are also affecting patient's functional outcome.   REHAB POTENTIAL: Good  CLINICAL DECISION MAKING: Evolving/moderate complexity  EVALUATION COMPLEXITY: Moderate  PLAN:  PT FREQUENCY: 1-2x/week  PT DURATION: 12 weeks  PLANNED INTERVENTIONS: 97164- PT Re-evaluation, 97750- Physical Performance Testing, 97110-Therapeutic exercises, 97530- Therapeutic activity, [id number]- Neuromuscular re-education, 97535- Self Care, 40981- Manual therapy, [id number]- Gait training, [phone]- Orthotic Initial, [phone]- Orthotic/Prosthetic subsequent, [phone]- Canalith repositioning, [phone]- Electrical stimulation (manual), Patient/Family education, Balance training, Stair training, Taping, Dry Needling, Joint mobilization, Joint manipulation, Spinal manipulation, Spinal mobilization, Vestibular training, DME instructions, Cryotherapy, and Moist heat  PLAN FOR NEXT SESSION: ABC survey, 6 min walk test, Initiate balance and LE strengthening/coordination activities, Safety with mobility training including using  AD as appropriate.    Murlene Army, PT 11/24/2023, 9:14 AM

## 2023-11-24 ENCOUNTER — Other Ambulatory Visit: Payer: Self-pay

## 2023-11-24 ENCOUNTER — Encounter: Payer: Medicare Other | Admitting: Primary Care

## 2023-11-24 ENCOUNTER — Telehealth: Payer: Self-pay | Admitting: Primary Care

## 2023-11-24 ENCOUNTER — Telehealth: Payer: Self-pay

## 2023-11-24 NOTE — Therapy (Addendum)
 OUTPATIENT PHYSICAL THERAPY NEURO TREATMENT   Patient Name: Kurt Bailey MRN: [id number] DOB:1946-07-17, 78 y.o., male Today's Date: 11/28/2023   PCP: Dr, Tretha Fu REFERRING PROVIDER: Dr. Tretha Fu  END OF SESSION:  PT End of Session - 11/28/23 1015     Visit Number 2    Number of Visits 25    Date for PT Re-Evaluation 02/15/24    Progress Note Due on Visit 10    PT Start Time 1015    PT Stop Time 1059    PT Time Calculation (min) 44 min    Equipment Utilized During Treatment Gait belt    Activity Tolerance Patient tolerated treatment well    Behavior During Therapy WFL for tasks assessed/performed              Past Medical History:  Diagnosis Date   Acute hypoxemic respiratory failure due to COVID-19 (HCC) 05/13/2020   Acute urinary retention 09/14/2023   Alcohol abuse    Diverticulitis    Periumbilical hernia    History reviewed. No pertinent surgical history. 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) 09/19/2018    ONSET DATE: 09/09/2023  REFERRING DIAG:  Z86.16 (ICD-10-CM) - History of COVID-19  R26.81 (ICD-10-CM) - Unsteady gait    THERAPY DIAG:  Abnormality of gait and mobility  Difficulty in walking, not elsewhere classified  Muscle weakness (generalized)  Other abnormalities of gait and mobility  Rationale for Evaluation and Treatment: Rehabilitation  SUBJECTIVE:                                                                                                                                                                                              SUBJECTIVE STATEMENT: A few stumbles but no falls to the ground since last session. Reports intermittent compliance.    Pt accompanied by: significant other-LaTisha  PERTINENT HISTORY: Patient is a 78 year old male with past medical history significant for Vascular dementia with other behavorial disturbances- refrreral to PT for unsteady gait worsened since bout with COVID-19 on 09/09/2023- Per [facility] discharge note on 09/19/2023: Hospital Course: DERYN ABRAMOWICZ is 78 y.o. male with alcohol abuse, diverticulitis who presents to the ED with  cough, chest congestion, dyspnea and myalgias.  Is also been having recurrent falls with generalized weakness per his daughter who brought him to the ER.  In the ED he was found to mildly tachycardic and hypertensive.  Labs mostly within normal limits.  Head CT is unremarkable, C-spine CT revealed severe degenerative changes with multilevel severe osseous neuroforaminal stenosis without acutely displaced fractures or traumatic listhesis.  Patient tested positive for COVID-19.  By reevaluation on 2/8 patient reported feeling weak but was overall all stable.  PAIN:  Are you having pain? No  PRECAUTIONS: Fall  RED FLAGS: None   WEIGHT BEARING RESTRICTIONS: No  FALLS: Has patient fallen in last 6 months? Yes. Number of falls 3  LIVING ENVIRONMENT: Lives with: lives with their family Lives in: House/apartment Stairs: Yes: External: 4 steps; can reach both Has following equipment at home: [name] - 2 wheeled and Grab bars  PLOF: Independent with community mobility with device, Needs assistance with ADLs, and Needs assistance with homemaking  PATIENT GOALS: Want to get around better.   OBJECTIVE:  Note: Objective measures were completed at Evaluation unless otherwise noted.  DIAGNOSTIC FINDINGS: CLINICAL DATA:  Neuro deficit, acute, stroke suspected; Neck trauma (Age  >= 65y) Pt to ed from home via [facility] for weakness   EXAM: CT HEAD WITHOUT CONTRAST   CT CERVICAL SPINE WITHOUT CONTRAST   TECHNIQUE: Multidetector CT imaging of the head and cervical spine was performed following the standard protocol without intravenous contrast. Multiplanar CT image reconstructions of the cervical spine were also generated.   RADIATION DOSE REDUCTION: This exam was performed according to the departmental dose-optimization program which includes automated exposure control, adjustment of the mA and/or kV according to patient size and/or use of iterative reconstruction technique.   COMPARISON:  MRI head 06/06/2023   FINDINGS: CT HEAD FINDINGS   Brain:   Cerebral ventricle sizes are concordant with the degree of cerebral volume loss. Patchy and confluent areas of decreased attenuation are noted throughout the deep and periventricular white matter of the cerebral hemispheres bilaterally, compatible with chronic microvascular ischemic disease. 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 No evidence of large-territorial acute infarction. No parenchymal hemorrhage. No mass lesion. No extra-axial collection.   No mass effect or midline shift. No hydrocephalus. Basilar cisterns are patent.   Vascular: No hyperdense vessel. Atherosclerotic calcifications are present within the cavernous internal carotid arteries.   Skull: No acute fracture or focal lesion.   Sinuses/Orbits: Bilateral maxillary, sphenoid, ethmoid sinus mucosal thickening. Otherwise paranasal sinuses and mastoid air cells are clear. The orbits are unremarkable.   Other: None.   CT CERVICAL SPINE FINDINGS   Alignment: Normal.   Skull base and vertebrae: Multilevel severe degenerative changes spine with associated multilevel severe osseous neural foraminal stenosis. No severe osseous central canal stenosis. No acute fracture. No aggressive appearing focal  osseous lesion or focal pathologic process.   Soft tissues and spinal canal: No prevertebral fluid or swelling. No visible canal hematoma.   Upper chest: Unremarkable.   Other: None.   IMPRESSION: 1. No acute intracranial abnormality. 2. No acute displaced fracture or traumatic listhesis of the cervical spine. 3. Multilevel severe degenerative changes spine with associated multilevel severe osseous neural foraminal stenosis.     Electronically Signed   By: Morgane  Naveau M.D.   On: 09/09/2023 21:42    COGNITION: Overall cognitive status: History of cognitive impairments - at baseline   SENSATION: WFL  COORDINATION: Impaired.   EDEMA:  None observed   POSTURE: rounded shoulders and forward head  LOWER EXTREMITY ROM:     Active  Right Eval Left Eval  Hip flexion    Hip extension    Hip abduction    Hip adduction    Hip internal rotation    Hip external rotation    Knee flexion    Knee extension    Ankle dorsiflexion    Ankle plantarflexion    Ankle inversion    Ankle eversion     (Blank rows = not tested)  LOWER EXTREMITY MMT:    MMT Right Eval Left Eval  Hip flexion 4 4  Hip extension 4 4  Hip abduction 4 4  Hip adduction 4 4  Hip internal rotation 4 4  Hip external rotation 4 4  Knee flexion 4 4  Knee extension 4 4  Ankle dorsiflexion 3+ 4  Ankle plantarflexion    Ankle inversion    Ankle eversion    (Blank rows = not tested)  BED MOBILITY:  Not tested  TRANSFERS: Sit to stand: CGA  Assistive device utilized: None     Stand to sit: CGA  Assistive device utilized: None     Chair to chair: CGA  Assistive device utilized: None         STAIRS: Not tested GAIT: Findings: Gait Characteristics: decreased arm swing- Right, decreased arm swing- Left, decreased step length- Right, decreased step length- Left, decreased stance time- Right, decreased stance time- Left, and decreased stride length, Distance walked: > 100 feet, Assistive  device utilized:None, and Level of assistance: CGA  FUNCTIONAL TESTS:  5 times sit to stand: 15.71 sec without UE support Timed up and go (TUG): 14.98 and 15.71 sec without UE support =15.3 sec avg 6 minute walk test: To be assessed 2nd visit 10 meter walk test: 0.7 m/s avg without AD Berg Balance Scale: 42/56  PATIENT SURVEYS:  ABC scale To be assessed next visit                                                                                                                              TREATMENT DATE:   ABC: 41%  6 Min Walk Test:  Instructed patient to ambulate as quickly and as safely as possible for 6 minutes using LRAD. Patient was allowed to take standing rest breaks without stopping the test, but if the patient required a sitting rest break the clock would be stopped and the test would be over.  Results: 309 ft stopped at 3 minutes  CGA. Results indicate that the patient has reduced endurance with ambulation compared to age matched norms.  Age Matched Norms: 83-69 yo M: 76 F: 19, 71-79 yo M: 54 F: 471, 76-89 yo M: 417 F: 392 MDC: 58.21 meters (190.98 feet) or 50 meters (ANPTA Core Set of Outcome Measures for Adults with Neurologic Conditions, 2018)   TherAct: Lateral step over orange hurdle 10x each LE with UE  support Step forward over/back orange hurdle 10x each LE 10x STS   Seated: RTB 4 way ankle 10x each direction ; each LE RTB hamstring curl 15x each LE  Neuro Re-ed: Standing with CGA next to support surface:  Airex pad: static stand 30 seconds x 2 trials, noticeable trembling of ankles/LE's with fatigue and challenge to maintain stability Airex pad: horizontal head turns 30 seconds scanning room 10x ; cueing for arc of motion  Airex pad: vertical head turns 30 seconds, cueing for arc of motion, noticeable sway with upward gaze increasing demand on ankle righting reaction musculature Airex pad: march 10x each LE; challenging for patient.   PATIENT  EDUCATION: Education details: PT plan of care; Purpose of PT as it relates to helping patient with strength, balance, HEP, Safety with mobility Person educated: Patient and Spouse Education method: Explanation, Demonstration, Tactile cues, Verbal cues, and Handouts Education comprehension: verbalized understanding and needs further education  HOME EXERCISE PROGRAM: Access Code: [email or web address] URL: [email or web address] Date: 11/23/2023 Prepared by: Ferrell Hu  Exercises - Seated Toe Raise  - 3 x weekly - 3 sets - 10 reps - Seated Heel Raise  - 3 x weekly - 3 sets - 10 reps - Standing Romberg to 1/2 Tandem Stance  - 3 x weekly - 3 sets - 30 sec hold  GOALS:  Goals reviewed with patient? Yes  SHORT TERM GOALS: Target date: 01/04/2024  Pt will be independent with HEP in order to improve strength and balance in order to decrease fall risk and improve function at home and work.  Baseline: EVAL- NO formal HEP in place Goal status: INITIAL   LONG TERM GOALS: Target date: 02/15/2024   1.  Patient (> 23 years old) will complete five times sit to stand test in < 15 seconds indicating an increased LE strength and improved balance. Baseline: EVAL: 15.71 sec without UE support Goal status: INITIAL  2.  Patient will improve ABC score by 9 points   to demonstrate statistically significant improvement in mobility and quality of life as it relates to their confidence in his balance.  Baseline: EVAL- To be assessed visit #2 4/28: 41% Goal status: INITIAL   3.  Patient will increase Berg Balance score by > 6 points to demonstrate decreased fall risk during functional activities. Baseline: EVAL= 42/56 Goal status: INITIAL   4.   Patient will reduce timed up and go to <11 seconds to reduce fall risk and demonstrate improved transfer/gait ability. Baseline: 15.3 sec avg without AD Goal status: INITIAL  5.   Patient will increase 10 meter walk test to >1.75m/s as to improve gait  speed for better community ambulation and to reduce fall risk. Baseline: EVAL= 0.7 m/s avg without AD Goal status: INITIAL  6.   Patient will increase six minute walk test distance to >1000 for progression to community ambulator and improve gait ability Baseline: To be assessed visit #2 4/28:  309 ft stopped at 3 minutes Goal status: INITIAL    ASSESSMENT:  CLINICAL IMPRESSION: Patient is unable to ambulate full distance of 6 minute walk test stopping at 3 minutes. He has significant foot drag during ambulation that worsens with fatigue. He does require multiple cues for task orientation throughout session. Mr. Lowder will benefit from skilled PT services to address these deficits in strength, mobility/balance and to improve his overall functional mobility and decrease risk for future falls.   OBJECTIVE IMPAIRMENTS: Abnormal gait, decreased activity tolerance, decreased balance, decreased cognition, decreased coordination, decreased  endurance, decreased knowledge of condition, decreased knowledge of use of DME, decreased mobility, difficulty walking, decreased strength, and postural dysfunction.   ACTIVITY LIMITATIONS: carrying, lifting, bending, sitting, standing, squatting, stairs, and transfers  PARTICIPATION LIMITATIONS: meal prep, cleaning, laundry, driving, shopping, community activity, and yard work  PERSONAL FACTORS: 1-2 comorbidities: Mixed Alzheimers and vascular dementia, HTN  are also affecting patient's functional outcome.   REHAB POTENTIAL: Good  CLINICAL DECISION MAKING: Evolving/moderate complexity  EVALUATION COMPLEXITY: Moderate  PLAN:  PT FREQUENCY: 1-2x/week  PT DURATION: 12 weeks  PLANNED INTERVENTIONS: 97164- PT Re-evaluation, 97750- Physical Performance Testing, 97110-Therapeutic exercises, 97530- Therapeutic activity, [id number]- Neuromuscular re-education, 97535- Self Care, 57846- Manual therapy, [id number]- Gait training, [phone]- Orthotic Initial, [phone]-  Orthotic/Prosthetic subsequent, [phone]- Canalith repositioning, [id number]- Electrical stimulation (manual), Patient/Family education, Balance training, Stair training, Taping, Dry Needling, Joint mobilization, Joint manipulation, Spinal manipulation, Spinal mobilization, Vestibular training, DME instructions, Cryotherapy, and Moist heat  PLAN FOR NEXT SESSION: ABC survey, 6 min walk test, Initiate balance and LE strengthening/coordination activities, Safety with mobility training including using AD as appropriate.    Keenan Dimitrov, PT 11/28/2023, 10:59 AM

## 2023-11-24 NOTE — Telephone Encounter (Signed)
 Copied from CRM [id number]. Topic: General - Other >> Nov 24, 2023  3:08 PM Marissa P wrote: Reason for CRM: Patient called back just received a call from someone to see how patient is doing she said

## 2023-11-25 ENCOUNTER — Other Ambulatory Visit

## 2023-11-25 NOTE — Patient Outreach (Signed)
 Complex Care Management   Visit Note  11/25/2023  Name:  Kurt Bailey MRN: [id number] DOB: November 01, 1945  Situation: Referral received for Complex Care Management related to  Mixed Alzheimer's and Vascular Dementia, HTN, Hyperlipidema  I obtained verbal consent from Caregiver.  Visit completed with Caregiver/Spouse, Kurt Bailey on the phone  Background:   Past Medical History:  Diagnosis Date   Acute hypoxemic respiratory failure due to COVID-19 [facility]) 05/13/2020   Acute urinary retention 09/14/2023   Alcohol abuse    Diverticulitis    Periumbilical hernia     Assessment: Patient Reported Symptoms:  Cognitive Cognitive Status: Unable to Assess (patient was taking a nap, spoke to spouse)      Neurological Neurological Review of Symptoms: Dizziness (Spouse reports "he was feeling a little dizzy so he is taking a nap".  Educated on encouraging more water intake, staying hydrated throughout the day by sipping fluids.) Neurological Conditions:  (History of Vertigo)  HEENT HEENT Symptoms Reported: Not assessed      Cardiovascular Cardiovascular Symptoms Reported: Dizziness (Spouse reported.) Cardiovascular Conditions: High blood cholesterol, Hypertension  Respiratory Respiratory Symptoms Reported: No symptoms reported (Spouse denies patient has had any respiratory symptoms today)    Endocrine Patient reports the following symptoms related to hypoglycemia or hyperglycemia : No symptoms reported (A1C 5.9 on 11/16/23) Is patient diabetic?: No Endocrine Self-Management Outcome: 4 (good)  Gastrointestinal Gastrointestinal Symptoms Reported: No symptoms reported      Genitourinary Genitourinary Symptoms Reported: No symptoms reported    Integumentary Integumentary Symptoms Reported: No symptoms reported    Musculoskeletal Musculoskelatal Symptoms Reviewed: No symptoms reported        Psychosocial       Quality of Family Relationships: helpful, involved, supportive Do you  feel physically threatened by others?: No      11/09/2023   11:51 AM  Depression screen PHQ 2/9  Decreased Interest 1  Down, Depressed, Hopeless 0  PHQ - 2 Score 1    There were no vitals filed for this visit.  Medications Reviewed Today   Medications were not reviewed in this encounter     Recommendation:   Provided contact information to [facility] for Dementia support.  Educated spouse on encouraging [name] to stay hydrated during the day which may help with occasional dizziness.   Reviewed upcoming appointments: Outpatient Rehab 11/28/23; PCP 12/01/23 for repeat potassium labwork,   Follow Up Plan:   Telephone follow up appointment date/time:  Friday, May 9th at 11:45am.   Kurt Bailey BSN, CCM [place]  [facility] Population Health RN Care Manager Direct Dial: [phone]  Fax: [phone]

## 2023-11-25 NOTE — Patient Instructions (Signed)
 Visit Information  Thank you for taking time to visit with me today. Please don't hesitate to contact me if I can be of assistance to you before our next scheduled appointment.  Your next care management appointment is by telephone on Friday, May 9th at 11:45am.    Please call the care guide team at [phone] if you need to cancel, schedule, or reschedule an appointment.   Please  if you are experiencing a Mental Health or Behavioral Health Crisis or need someone to talk to.  Jurline Olmsted BSN, CCM [place]  [facility] Population Health RN Care Manager Direct Dial: [phone]  Fax: [phone]

## 2023-11-25 NOTE — Telephone Encounter (Signed)
Left message to return call to our office. Need to get more information.

## 2023-11-28 ENCOUNTER — Ambulatory Visit

## 2023-11-28 DIAGNOSIS — R269 Unspecified abnormalities of gait and mobility: Secondary | ICD-10-CM | POA: Diagnosis not present

## 2023-11-28 DIAGNOSIS — R2689 Other abnormalities of gait and mobility: Secondary | ICD-10-CM

## 2023-11-28 DIAGNOSIS — R262 Difficulty in walking, not elsewhere classified: Secondary | ICD-10-CM | POA: Diagnosis not present

## 2023-11-28 DIAGNOSIS — M6281 Muscle weakness (generalized): Secondary | ICD-10-CM | POA: Diagnosis not present

## 2023-11-28 DIAGNOSIS — R278 Other lack of coordination: Secondary | ICD-10-CM | POA: Diagnosis not present

## 2023-11-28 DIAGNOSIS — R2681 Unsteadiness on feet: Secondary | ICD-10-CM | POA: Diagnosis not present

## 2023-11-28 NOTE — Patient Outreach (Signed)
 Complex Care Management   Visit Note  11/28/2023  Name:  DAZ FABIEN MRN: [id number] DOB: 01-Jan-1946  Situation: Referral received for Complex Care Management related to Dementia I obtained verbal consent from Caregiver.  Visit completed with Caregiver/wife, Baxter Bott   on the phone  Background:   Past Medical History:  Diagnosis Date   Acute hypoxemic respiratory failure due to COVID-19 [facility]) 05/13/2020   Acute urinary retention 09/14/2023   Alcohol abuse    Diverticulitis    Periumbilical hernia     Assessment: Patient Reported Symptoms:  Cognitive Cognitive Status: Unable to Assess (patient was taking a nap, spoke to spouse)      Neurological Neurological Review of Symptoms: Dizziness (Spouse reports "he was feeling a little dizzy so he is taking a nap".  Educated on encouraging more water intake, staying hydrated throughout the day by sipping fluids.) Neurological Conditions:  (History of Vertigo)  HEENT HEENT Symptoms Reported: Not assessed      Cardiovascular Cardiovascular Symptoms Reported: Dizziness (Spouse reported.) Cardiovascular Conditions: High blood cholesterol, Hypertension  Respiratory Respiratory Symptoms Reported: No symptoms reported (Spouse denies patient has had any respiratory symptoms today)    Endocrine Patient reports the following symptoms related to hypoglycemia or hyperglycemia : No symptoms reported (A1C 5.9 on 11/16/23) Is patient diabetic?: No Endocrine Self-Management Outcome: 4 (good)  Gastrointestinal Gastrointestinal Symptoms Reported: No symptoms reported      Genitourinary Genitourinary Symptoms Reported: No symptoms reported    Integumentary Integumentary Symptoms Reported: No symptoms reported    Musculoskeletal Musculoskelatal Symptoms Reviewed: No symptoms reported        Psychosocial       Quality of Family Relationships: helpful, involved, supportive Do you feel physically threatened by others?: No       11/09/2023   11:51 AM  Depression screen PHQ 2/9  Decreased Interest 1  Down, Depressed, Hopeless 0  PHQ - 2 Score 1    There were no vitals filed for this visit.  Medications Reviewed Today   Medications were not reviewed in this encounter     Recommendation:   -Spouse states Mr. Mesner is signed up for outpatient PT and will try to make all the appointments the best they can, relying on their son to provide transportation.   -Spouse/caregiver will encourage walker use, taking care when going from lying/sitting to standing, encourage more fluid intake, when Mr. Michaelson is complaining of lightheadedness ,  -Provided Spouse with contact information to [facility] [phone] for resources for family dealing with a loved one with Dementia.  This RNCM discussed techniques to encourage bathing since Spouse states Mr. Gleissner has not been showering on a regular basis.    Follow Up Plan:   Telephone follow-up Friday, May 9th at 11;45am.   Jurline Olmsted BSN, CCM [place]  [facility] Population Health RN Care Manager Direct Dial: [phone]  Fax: [phone]

## 2023-11-30 ENCOUNTER — Ambulatory Visit: Admitting: Physical Therapy

## 2023-11-30 DIAGNOSIS — R269 Unspecified abnormalities of gait and mobility: Secondary | ICD-10-CM | POA: Diagnosis not present

## 2023-11-30 DIAGNOSIS — R2689 Other abnormalities of gait and mobility: Secondary | ICD-10-CM | POA: Diagnosis not present

## 2023-11-30 DIAGNOSIS — R262 Difficulty in walking, not elsewhere classified: Secondary | ICD-10-CM

## 2023-11-30 DIAGNOSIS — M6281 Muscle weakness (generalized): Secondary | ICD-10-CM | POA: Diagnosis not present

## 2023-11-30 DIAGNOSIS — R278 Other lack of coordination: Secondary | ICD-10-CM | POA: Diagnosis not present

## 2023-11-30 DIAGNOSIS — R2681 Unsteadiness on feet: Secondary | ICD-10-CM | POA: Diagnosis not present

## 2023-11-30 NOTE — Therapy (Signed)
 OUTPATIENT PHYSICAL THERAPY NEURO TREATMENT   Patient Name: Kurt Bailey MRN: [id number] DOB:[birth year], 78 y.o., male Today's Date: 11/30/2023   PCP: Dr, Tretha Fu REFERRING PROVIDER: Dr. Tretha Fu  END OF SESSION:  PT End of Session - 11/30/23 1052     Visit Number 3    Number of Visits 25    Date for PT Re-Evaluation 02/15/24    Progress Note Due on Visit 10    PT Start Time 1050    PT Stop Time 1130    PT Time Calculation (min) 40 min    Equipment Utilized During Treatment Gait belt    Activity Tolerance Patient tolerated treatment well    Behavior During Therapy WFL for tasks assessed/performed               Past Medical History:  Diagnosis Date   Acute hypoxemic respiratory failure due to COVID-19 (HCC) 05/13/2020   Acute urinary retention 09/14/2023   Alcohol abuse    Diverticulitis    Periumbilical hernia    No past surgical history on file. 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) 09/19/2018    ONSET DATE: 09/09/2023  REFERRING DIAG:  Z86.16 (ICD-10-CM) - History of COVID-19  R26.81 (ICD-10-CM) - Unsteady gait    THERAPY DIAG:  Abnormality of gait and mobility  Difficulty in walking, not elsewhere classified  Muscle weakness (generalized)  Other abnormalities of gait and mobility  Unsteadiness on feet  Rationale for Evaluation and Treatment: Rehabilitation  SUBJECTIVE:                                                                                                                                                                                              SUBJECTIVE STATEMENT:  A few stumbles but no falls to the ground since last session. No updates, accompanied by wife this session.    Pt accompanied by: significant other-LaTisha  PERTINENT HISTORY: Patient is a 78 year old male with past medical history significant for Vascular dementia with other behavorial disturbances- refrreral to PT for unsteady gait worsened since bout with COVID-19 on 09/09/2023- Per [facility] discharge note on 09/19/2023: Hospital Course: Kurt Bailey is 78 y.o. male  with alcohol abuse, diverticulitis who presents to the ED with cough, chest congestion, dyspnea and myalgias.  Is also been having recurrent falls with generalized weakness per his daughter who brought him to the ER.  In the ED he was found to mildly tachycardic and hypertensive.  Labs mostly within normal limits.  Head CT is unremarkable, C-spine CT revealed severe degenerative changes with multilevel severe osseous neuroforaminal stenosis without acutely displaced fractures or traumatic listhesis.  Patient tested positive for COVID-19.  By reevaluation on 2/8 patient reported feeling weak but was overall all stable.  PAIN:  Are you having pain? No  PRECAUTIONS: Fall  RED FLAGS: None   WEIGHT BEARING RESTRICTIONS: No  FALLS: Has patient fallen in last 6 months? Yes. Number of falls 3  LIVING ENVIRONMENT: Lives with: lives with their family Lives in: House/apartment Stairs: Yes: External: 4 steps; can reach both Has following equipment at home: [name] - 2 wheeled and Grab bars  PLOF: Independent with community mobility with device, Needs assistance with ADLs, and Needs assistance with homemaking  PATIENT GOALS: Want to get around better.   OBJECTIVE:  Note: Objective measures were completed at Evaluation unless otherwise noted.  DIAGNOSTIC FINDINGS: CLINICAL DATA:  Neuro deficit, acute, stroke  suspected; Neck trauma (Age >= 65y) Pt to ed from home via [facility] for weakness   EXAM: CT HEAD WITHOUT CONTRAST   CT CERVICAL SPINE WITHOUT CONTRAST   TECHNIQUE: Multidetector CT imaging of the head and cervical spine was performed following the standard protocol without intravenous contrast. Multiplanar CT image reconstructions of the cervical spine were also generated.   RADIATION DOSE REDUCTION: This exam was performed according to the departmental dose-optimization program which includes automated exposure control, adjustment of the mA and/or kV according to patient size and/or use of iterative reconstruction technique.   COMPARISON:  MRI head 06/06/2023   FINDINGS: CT HEAD FINDINGS   Brain:   Cerebral ventricle sizes are concordant with the degree of cerebral volume loss. Patchy and confluent areas of decreased attenuation are noted throughout the deep and periventricular white matter of the cerebral hemispheres bilaterally, compatible with chronic microvascular ischemic disease. 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 No evidence of large-territorial acute infarction. No parenchymal hemorrhage. No mass lesion. No extra-axial collection.   No mass effect or midline shift. No hydrocephalus. Basilar cisterns are patent.   Vascular: No hyperdense vessel. Atherosclerotic calcifications are present within the cavernous internal carotid arteries.   Skull: No acute fracture or focal lesion.   Sinuses/Orbits: Bilateral maxillary, sphenoid, ethmoid sinus mucosal thickening. Otherwise paranasal sinuses and mastoid air cells are clear. The orbits are unremarkable.   Other: None.   CT CERVICAL SPINE FINDINGS   Alignment: Normal.   Skull base and vertebrae: Multilevel severe degenerative changes spine with associated multilevel severe osseous neural foraminal stenosis. No severe osseous central canal stenosis. No acute fracture.  No aggressive appearing focal osseous lesion or focal pathologic process.   Soft tissues and spinal canal: No prevertebral fluid or swelling. No visible canal hematoma.   Upper chest: Unremarkable.   Other: None.   IMPRESSION: 1. No acute intracranial abnormality. 2. No acute displaced fracture or traumatic listhesis of the cervical spine. 3. Multilevel severe degenerative changes spine with associated multilevel severe osseous neural foraminal stenosis.     Electronically Signed   By: Morgane  Naveau M.D.   On: 09/09/2023 21:42    COGNITION: Overall cognitive status: History of cognitive impairments - at baseline  SENSATION: WFL  COORDINATION: Impaired.   EDEMA:  None observed   POSTURE: rounded shoulders and forward head  LOWER EXTREMITY ROM:     Active  Right Eval Left Eval  Hip flexion    Hip extension    Hip abduction    Hip adduction    Hip internal rotation    Hip external rotation    Knee flexion    Knee extension    Ankle dorsiflexion    Ankle plantarflexion    Ankle inversion    Ankle eversion     (Blank rows = not tested)  LOWER EXTREMITY MMT:    MMT Right Eval Left Eval  Hip flexion 4 4  Hip extension 4 4  Hip abduction 4 4  Hip adduction 4 4  Hip internal rotation 4 4  Hip external rotation 4 4  Knee flexion 4 4  Knee extension 4 4  Ankle dorsiflexion 3+ 4  Ankle plantarflexion    Ankle inversion    Ankle eversion    (Blank rows = not tested)  BED MOBILITY:  Not tested  TRANSFERS: Sit to stand: CGA  Assistive device utilized: None     Stand to sit: CGA  Assistive device utilized: None     Chair to chair: CGA  Assistive device utilized: None         STAIRS: Not tested GAIT: Findings: Gait Characteristics: decreased arm swing- Right, decreased arm swing- Left, decreased step length- Right, decreased step length- Left, decreased stance time- Right, decreased stance time- Left, and decreased stride length, Distance  walked: > 100 feet, Assistive device utilized:None, and Level of assistance: CGA  FUNCTIONAL TESTS:  5 times sit to stand: 15.71 sec without UE support Timed up and go (TUG): 14.98 and 15.71 sec without UE support =15.3 sec avg 6 minute walk test: To be assessed 2nd visit 10 meter walk test: 0.7 m/s avg without AD Berg Balance Scale: 42/56  PATIENT SURVEYS:  ABC scale To be assessed next visit                                                                                                                              TREATMENT DATE:   TE Nustep  Nustep B UE and LE reciprocal movements - level 3 x 6 min SPM 40-50 Seated heel raise on incline 2 x 15 x 2 sec hold with 2.5# AW Seated toe raise on decline 2 x 15 x 2 sec hold with 2.5# AW  TherAct: Lateral step over orange hurdle 20x each LE with UE support, heavy cues for proper alignment for hip abduction activation  Step forward over/back orange hurdle 20x each LE  Gait training - ample time spent on instruction through all below exercises  Step to 1/2 foam roll focusing on heel contact and DF activation 2 x 10 ea LE   - used above patter with gait in // bars, mixed success, pt weight shift too  much post despite cues. X several in   - gait in ladder working on large steps and heel to gait, good ability but frequent cues for focus on activity at hand ( pt reverts to 2 feet in each ladder rung with distraction from task) x several min   PATIENT EDUCATION: Education details: PT plan of care; Purpose of PT as it relates to helping patient with strength, balance, HEP, Safety with mobility Person educated: Patient and Spouse Education method: Explanation, Demonstration, Tactile cues, Verbal cues, and Handouts Education comprehension: verbalized understanding and needs further education  HOME EXERCISE PROGRAM: Access Code: [email or web address] URL: [email or web address] Date: 11/23/2023 Prepared by: Ferrell Hu  Exercises -  Seated Toe Raise  - 3 x weekly - 3 sets - 10 reps - Seated Heel Raise  - 3 x weekly - 3 sets - 10 reps - Standing Romberg to 1/2 Tandem Stance  - 3 x weekly - 3 sets - 30 sec hold  GOALS:  Goals reviewed with patient? Yes  SHORT TERM GOALS: Target date: 01/04/2024  Pt will be independent with HEP in order to improve strength and balance in order to decrease fall risk and improve function at home and work.  Baseline: EVAL- NO formal HEP in place Goal status: INITIAL   LONG TERM GOALS: Target date: 02/15/2024   1.  Patient (> 33 years old) will complete five times sit to stand test in < 15 seconds indicating an increased LE strength and improved balance. Baseline: EVAL: 15.71 sec without UE support Goal status: INITIAL  2.  Patient will improve ABC score by 9 points   to demonstrate statistically significant improvement in mobility and quality of life as it relates to their confidence in his balance.  Baseline: EVAL- To be assessed visit #2 4/28: 41% Goal status: INITIAL   3.  Patient will increase Berg Balance score by > 6 points to demonstrate decreased fall risk during functional activities. Baseline: EVAL= 42/56 Goal status: INITIAL   4.   Patient will reduce timed up and go to <11 seconds to reduce fall risk and demonstrate improved transfer/gait ability. Baseline: 15.3 sec avg without AD Goal status: INITIAL  5.   Patient will increase 10 meter walk test to >1.56m/s as to improve gait speed for better community ambulation and to reduce fall risk. Baseline: EVAL= 0.7 m/s avg without AD Goal status: INITIAL  6.   Patient will increase six minute walk test distance to >1000 for progression to community ambulator and improve gait ability Baseline: To be assessed visit #2 4/28:  309 ft stopped at 3 minutes Goal status: INITIAL    ASSESSMENT:  CLINICAL IMPRESSION: Patient arrived with good motivation for completion of pt activities.  Pr requires frequent attention to task at  hand and purpose of exercise. Pt showed little carryover to gait following session despite cues immediately prior. Will continue to implement activities to improve gait and balance to improve his safety with mobility and everyday activities.  Mr. Dewaard will benefit from skilled PT services to address these deficits in strength, mobility/balance and to improve his overall functional mobility and decrease risk for future falls.   OBJECTIVE IMPAIRMENTS: Abnormal gait, decreased activity tolerance, decreased balance, decreased cognition, decreased coordination, decreased endurance, decreased knowledge of condition, decreased knowledge of use of DME, decreased mobility, difficulty walking, decreased strength, and postural dysfunction.   ACTIVITY LIMITATIONS: carrying, lifting, bending, sitting, standing, squatting, stairs, and transfers  PARTICIPATION LIMITATIONS: meal prep, cleaning, laundry, driving, shopping, community activity,  and yard work  PERSONAL FACTORS: 1-2 comorbidities: Mixed Alzheimers and vascular dementia, HTN  are also affecting patient's functional outcome.   REHAB POTENTIAL: Good  CLINICAL DECISION MAKING: Evolving/moderate complexity  EVALUATION COMPLEXITY: Moderate  PLAN:  PT FREQUENCY: 1-2x/week  PT DURATION: 12 weeks  PLANNED INTERVENTIONS: 97164- PT Re-evaluation, 97750- Physical Performance Testing, 97110-Therapeutic exercises, 97530- Therapeutic activity, [id number]- Neuromuscular re-education, 97535- Self Care, 09811- Manual therapy, [id number]- Gait training, [phone]- Orthotic Initial, [phone]- Orthotic/Prosthetic subsequent, [phone]- Canalith repositioning, [phone]- Electrical stimulation (manual), Patient/Family education, Balance training, Stair training, Taping, Dry Needling, Joint mobilization, Joint manipulation, Spinal manipulation, Spinal mobilization, Vestibular training, DME instructions, Cryotherapy, and Moist heat  PLAN FOR NEXT SESSION: balance and LE  strengthening/coordination activities, Safety with mobility training including using AD as appropriate.    Edwina Gram, PT 11/30/2023, 10:53 AM

## 2023-11-30 NOTE — Telephone Encounter (Signed)
 Left message to return call to our office.

## 2023-12-01 ENCOUNTER — Other Ambulatory Visit

## 2023-12-01 DIAGNOSIS — E876 Hypokalemia: Secondary | ICD-10-CM | POA: Diagnosis not present

## 2023-12-01 LAB — POTASSIUM: Potassium: 3.8 meq/L (ref 3.5–5.1)

## 2023-12-01 NOTE — Therapy (Signed)
 OUTPATIENT PHYSICAL THERAPY NEURO TREATMENT   Patient Name: Kurt Bailey MRN: [id number] DOB:05-19-1946, 78 y.o., male Today's Date: 12/05/2023   PCP: Dr, Kurt Bailey REFERRING PROVIDER: Dr. Tretha Bailey  END OF SESSION:  PT End of Session - 12/05/23 1009     Visit Number 4    Number of Visits 25    Date for PT Re-Evaluation 02/15/24    Progress Note Due on Visit 10    PT Start Time 1015    PT Stop Time 1059    PT Time Calculation (min) 44 min    Equipment Utilized During Treatment Gait belt    Activity Tolerance Patient tolerated treatment well    Behavior During Therapy WFL for tasks assessed/performed                Past Medical History:  Diagnosis Date   Acute hypoxemic respiratory failure due to COVID-19 (HCC) 05/13/2020   Acute urinary retention 09/14/2023   Alcohol abuse    Diverticulitis    Periumbilical hernia    History reviewed. No pertinent surgical history. 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) 09/19/2018    ONSET DATE: 09/09/2023  REFERRING DIAG:  Z86.16 (ICD-10-CM) - History of COVID-19  R26.81 (ICD-10-CM) - Unsteady gait    THERAPY DIAG:  Abnormality of gait and mobility  Difficulty in walking, not elsewhere classified  Muscle weakness (generalized)  Other abnormalities of gait and mobility  Rationale for Evaluation and Treatment: Rehabilitation  SUBJECTIVE:                                                                                                                                                                                              SUBJECTIVE STATEMENT:  Patient reports he is feeling good today.    Pt accompanied by: significant other-Kurt Bailey  PERTINENT HISTORY: Patient is a 78 year old male with past medical history significant for Vascular dementia with other behavorial disturbances- refrreral to PT for unsteady gait worsened since bout with COVID-19 on 09/09/2023- Per [facility] discharge note on 09/19/2023: Hospital Course: Kurt Bailey is 78 y.o. male with alcohol abuse, diverticulitis who presents to the ED with cough, chest congestion, dyspnea and  myalgias.  Is also been having recurrent falls with generalized weakness per his daughter who brought him to the ER.  In the ED he was found to mildly tachycardic and hypertensive.  Labs mostly within normal limits.  Head CT is unremarkable, C-spine CT revealed severe degenerative changes with multilevel severe osseous neuroforaminal stenosis without acutely displaced fractures or traumatic listhesis.  Patient tested positive for COVID-19.  By reevaluation on 2/8 patient reported feeling weak but was overall all stable.  PAIN:  Are you having pain? No  PRECAUTIONS: Fall  RED FLAGS: None   WEIGHT BEARING RESTRICTIONS: No  FALLS: Has patient fallen in last 6 months? Yes. Number of falls 3  LIVING ENVIRONMENT: Lives with: lives with their family Lives in: House/apartment Stairs: Yes: External: 4 steps; can reach both Has following equipment at home: [name] - 2 wheeled and Grab bars  PLOF: Independent with community mobility with device, Needs assistance with ADLs, and Needs assistance with homemaking  PATIENT GOALS: Want to get around better.   OBJECTIVE:  Note: Objective measures were completed at Evaluation unless otherwise noted.  DIAGNOSTIC FINDINGS: CLINICAL DATA:  Neuro deficit, acute, stroke suspected; Neck trauma (Age >= 65y) Pt to ed from home via [facility] for  weakness   EXAM: CT HEAD WITHOUT CONTRAST   CT CERVICAL SPINE WITHOUT CONTRAST   TECHNIQUE: Multidetector CT imaging of the head and cervical spine was performed following the standard protocol without intravenous contrast. Multiplanar CT image reconstructions of the cervical spine were also generated.   RADIATION DOSE REDUCTION: This exam was performed according to the departmental dose-optimization program which includes automated exposure control, adjustment of the mA and/or kV according to patient size and/or use of iterative reconstruction technique.   COMPARISON:  MRI head 06/06/2023   FINDINGS: CT HEAD FINDINGS   Brain:   Cerebral ventricle sizes are concordant with the degree of cerebral volume loss. Patchy and confluent areas of decreased attenuation are noted throughout the deep and periventricular white matter of the cerebral hemispheres bilaterally, compatible with chronic microvascular ischemic disease. 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 No evidence of large-territorial acute infarction. No parenchymal hemorrhage. No mass lesion. No extra-axial collection.   No mass effect or midline shift. No hydrocephalus. Basilar cisterns are patent.   Vascular: No hyperdense vessel. Atherosclerotic calcifications are present within the cavernous internal carotid arteries.   Skull: No acute fracture or focal lesion.   Sinuses/Orbits: Bilateral maxillary, sphenoid, ethmoid sinus mucosal thickening. Otherwise paranasal sinuses and mastoid air cells are clear. The orbits are unremarkable.   Other: None.   CT CERVICAL SPINE FINDINGS   Alignment: Normal.   Skull base and vertebrae: Multilevel severe degenerative changes spine with associated multilevel severe osseous neural foraminal stenosis. No severe osseous central canal stenosis. No acute fracture. No aggressive appearing focal osseous lesion or focal pathologic  process.   Soft tissues and spinal canal: No prevertebral fluid or swelling. No visible canal hematoma.   Upper chest: Unremarkable.   Other: None.   IMPRESSION: 1. No acute intracranial abnormality. 2. No acute displaced fracture or traumatic listhesis of the cervical spine. 3. Multilevel severe degenerative changes spine with associated multilevel severe osseous neural foraminal stenosis.     Electronically Signed   By: Morgane  Naveau M.D.   On: 09/09/2023 21:42    COGNITION: Overall cognitive status: History of cognitive impairments - at baseline   SENSATION: WFL  COORDINATION: Impaired.   EDEMA:  None observed   POSTURE:  rounded shoulders and forward head  LOWER EXTREMITY ROM:     Active  Right Eval Left Eval  Hip flexion    Hip extension    Hip abduction    Hip adduction    Hip internal rotation    Hip external rotation    Knee flexion    Knee extension    Ankle dorsiflexion    Ankle plantarflexion    Ankle inversion    Ankle eversion     (Blank rows = not tested)  LOWER EXTREMITY MMT:    MMT Right Eval Left Eval  Hip flexion 4 4  Hip extension 4 4  Hip abduction 4 4  Hip adduction 4 4  Hip internal rotation 4 4  Hip external rotation 4 4  Knee flexion 4 4  Knee extension 4 4  Ankle dorsiflexion 3+ 4  Ankle plantarflexion    Ankle inversion    Ankle eversion    (Blank rows = not tested)  BED MOBILITY:  Not tested  TRANSFERS: Sit to stand: CGA  Assistive device utilized: None     Stand to sit: CGA  Assistive device utilized: None     Chair to chair: CGA  Assistive device utilized: None         STAIRS: Not tested GAIT: Findings: Gait Characteristics: decreased arm swing- Right, decreased arm swing- Left, decreased step length- Right, decreased step length- Left, decreased stance time- Right, decreased stance time- Left, and decreased stride length, Distance walked: > 100 feet, Assistive device utilized:None, and Level of  assistance: CGA  FUNCTIONAL TESTS:  5 times sit to stand: 15.71 sec without UE support Timed up and go (TUG): 14.98 and 15.71 sec without UE support =15.3 sec avg 6 minute walk test: To be assessed 2nd visit 10 meter walk test: 0.7 m/s avg without AD Berg Balance Scale: 42/56  PATIENT SURVEYS:  ABC scale To be assessed next visit                                                                                                                              TREATMENT DATE:   Blood Pressure: seated R arm: 121/78   TherAct: Superset: 3 sets  Exercise 1: Ambulate 150 ft ; L foot drag worsened with fatigue Exercise 2: STS 10x; need for UE support  *HR monitored to stay within functional range.   6" step: -toe taps 10x each LE -step up/down 10x each LE  4lb ankle weights: 2 sets  -march 15x each LE -needs visual cue of hand for full range -LAQ 10x; each LE cue for hand for placement of leg -heel raise 10x;   Neuro Re-ed: Standing with CGA next to support surface:  Airex pad: static stand 30 seconds x 2 trials, noticeable trembling of ankles/LE's with fatigue and challenge to maintain stability Airex pad: horizontal head turns 30 seconds scanning room 10x ; cueing for arc of motion  Airex pad: vertical head  turns 30 seconds, cueing for arc of motion, noticeable sway with upward gaze increasing demand on ankle righting reaction musculature Airex pad: march 10x each LE; challenging for patient.   PATIENT EDUCATION: Education details: PT plan of care; Purpose of PT as it relates to helping patient with strength, balance, HEP, Safety with mobility Person educated: Patient and Spouse Education method: Explanation, Demonstration, Tactile cues, Verbal cues, and Handouts Education comprehension: verbalized understanding and needs further education  HOME EXERCISE PROGRAM: Access Code: [email or web address] URL: [email or web address] Date: 11/23/2023 Prepared by: Ferrell Hu  Exercises - Seated Toe Raise  - 3 x weekly - 3 sets - 10 reps - Seated Heel Raise  - 3 x weekly - 3 sets - 10 reps - Standing Romberg to 1/2 Tandem Stance  - 3 x weekly - 3 sets - 30 sec hold  GOALS:  Goals reviewed with patient? Yes  SHORT TERM GOALS: Target date: 01/04/2024  Pt will be independent with HEP in order to improve strength and balance in order to decrease fall risk and improve function at home and work.  Baseline: EVAL- NO formal HEP in place Goal status: INITIAL   LONG TERM GOALS: Target date: 02/15/2024   1.  Patient (> 97 years old) will complete five times sit to stand test in < 15 seconds indicating an increased LE strength and improved balance. Baseline: EVAL: 15.71 sec without UE support Goal status: INITIAL  2.  Patient will improve ABC score by 9 points   to demonstrate statistically significant improvement in mobility and quality of life as it relates to their confidence in his balance.  Baseline: EVAL- To be assessed visit #2 4/28: 41% Goal status: INITIAL   3.  Patient will increase Berg Balance score by > 6 points to demonstrate decreased fall risk during functional activities. Baseline: EVAL= 42/56 Goal status: INITIAL   4.   Patient will reduce timed up and go to <11 seconds to reduce fall risk and demonstrate improved transfer/gait ability. Baseline: 15.3 sec avg without AD Goal status: INITIAL  5.   Patient will increase 10 meter walk test to >1.62m/s as to improve gait speed for better community ambulation and to reduce fall risk. Baseline: EVAL= 0.7 m/s avg without AD Goal status: INITIAL  6.   Patient will increase six minute walk test distance to >1000 for progression to community ambulator and improve gait ability Baseline: To be assessed visit #2 4/28:  309 ft stopped at 3 minutes Goal status: INITIAL    ASSESSMENT:  CLINICAL IMPRESSION: Patient tolerates progressive mobility interventions with significant fatigue by end  of third superset. He require intermittent cues for task orientation throughout session.  He has significant foot drag with fatigue.   Mr. Silverstone will benefit from skilled PT services to address these deficits in strength, mobility/balance and to improve his overall functional mobility and decrease risk for future falls.   OBJECTIVE IMPAIRMENTS: Abnormal gait, decreased activity tolerance, decreased balance, decreased cognition, decreased coordination, decreased endurance, decreased knowledge of condition, decreased knowledge of use of DME, decreased mobility, difficulty walking, decreased strength, and postural dysfunction.   ACTIVITY LIMITATIONS: carrying, lifting, bending, sitting, standing, squatting, stairs, and transfers  PARTICIPATION LIMITATIONS: meal prep, cleaning, laundry, driving, shopping, community activity, and yard work  PERSONAL FACTORS: 1-2 comorbidities: Mixed Alzheimers and vascular dementia, HTN  are also affecting patient's functional outcome.   REHAB POTENTIAL: Good  CLINICAL DECISION MAKING: Evolving/moderate complexity  EVALUATION COMPLEXITY: Moderate  PLAN:  PT FREQUENCY: 1-2x/week  PT DURATION: 12 weeks  PLANNED INTERVENTIONS: 97164- PT Re-evaluation, 97750- Physical Performance Testing, 97110-Therapeutic exercises, 97530- Therapeutic activity, [id number]- Neuromuscular re-education, 97535- Self Care, 16109- Manual therapy, [id number]- Gait training, [phone]- Orthotic Initial, [phone]- Orthotic/Prosthetic subsequent, [phone]- Canalith repositioning, [phone]- Electrical stimulation (manual), Patient/Family education, Balance training, Stair training, Taping, Dry Needling, Joint mobilization, Joint manipulation, Spinal manipulation, Spinal mobilization, Vestibular training, DME instructions, Cryotherapy, and Moist heat  PLAN FOR NEXT SESSION: balance and LE strengthening/coordination activities, Safety with mobility training including using AD as appropriate.    Sedale Jenifer,  PT 12/05/2023, 11:01 AM

## 2023-12-01 NOTE — Telephone Encounter (Signed)
 See result note.

## 2023-12-05 ENCOUNTER — Telehealth: Payer: Self-pay | Admitting: Primary Care

## 2023-12-05 ENCOUNTER — Ambulatory Visit: Attending: Primary Care

## 2023-12-05 ENCOUNTER — Encounter: Payer: Self-pay | Admitting: Primary Care

## 2023-12-05 DIAGNOSIS — R278 Other lack of coordination: Secondary | ICD-10-CM | POA: Insufficient documentation

## 2023-12-05 DIAGNOSIS — R2681 Unsteadiness on feet: Secondary | ICD-10-CM | POA: Diagnosis not present

## 2023-12-05 DIAGNOSIS — R269 Unspecified abnormalities of gait and mobility: Secondary | ICD-10-CM | POA: Insufficient documentation

## 2023-12-05 DIAGNOSIS — M6281 Muscle weakness (generalized): Secondary | ICD-10-CM | POA: Diagnosis not present

## 2023-12-05 DIAGNOSIS — R2689 Other abnormalities of gait and mobility: Secondary | ICD-10-CM | POA: Insufficient documentation

## 2023-12-05 DIAGNOSIS — R262 Difficulty in walking, not elsewhere classified: Secondary | ICD-10-CM | POA: Diagnosis not present

## 2023-12-05 NOTE — Telephone Encounter (Signed)
 Pt's wife, Luetishia, called back returning Kelli's call regarding pt's lab results. Relayed Clark's message regarding potassium level, no questions/concerns. Luetishia appreciates results being mailed out to pt. Call back # [phone]

## 2023-12-07 ENCOUNTER — Ambulatory Visit: Admitting: Physical Therapy

## 2023-12-07 DIAGNOSIS — M6281 Muscle weakness (generalized): Secondary | ICD-10-CM | POA: Diagnosis not present

## 2023-12-07 DIAGNOSIS — R2681 Unsteadiness on feet: Secondary | ICD-10-CM

## 2023-12-07 DIAGNOSIS — R269 Unspecified abnormalities of gait and mobility: Secondary | ICD-10-CM

## 2023-12-07 DIAGNOSIS — R2689 Other abnormalities of gait and mobility: Secondary | ICD-10-CM

## 2023-12-07 DIAGNOSIS — R278 Other lack of coordination: Secondary | ICD-10-CM | POA: Diagnosis not present

## 2023-12-07 DIAGNOSIS — R262 Difficulty in walking, not elsewhere classified: Secondary | ICD-10-CM | POA: Diagnosis not present

## 2023-12-07 NOTE — Therapy (Signed)
 OUTPATIENT PHYSICAL THERAPY NEURO TREATMENT   Patient Name: Kurt Bailey MRN: [id number] DOB:[birth year], 78 y.o., male Today's Date: 12/07/2023   PCP: Dr, Kurt Bailey REFERRING PROVIDER: Dr. Tretha Bailey  END OF SESSION:  PT End of Session - 12/07/23 1146     Visit Number 5    Number of Visits 25    Date for PT Re-Evaluation 02/15/24    Progress Note Due on Visit 10    PT Start Time 1147    PT Stop Time 1226    PT Time Calculation (min) 39 min    Equipment Utilized During Treatment Gait belt    Activity Tolerance Patient tolerated treatment well    Behavior During Therapy WFL for tasks assessed/performed                 Past Medical History:  Diagnosis Date   Acute hypoxemic respiratory failure due to COVID-19 (HCC) 05/13/2020   Acute urinary retention 09/14/2023   Alcohol abuse    Diverticulitis    Periumbilical hernia    No past surgical history on file. 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) 09/19/2018    ONSET DATE: 09/09/2023  REFERRING DIAG:  Z86.16 (ICD-10-CM) - History of COVID-19  R26.81 (ICD-10-CM) - Unsteady gait    THERAPY DIAG:  Abnormality of gait and mobility  Difficulty in walking, not elsewhere classified  Muscle weakness (generalized)  Other abnormalities of gait and mobility  Unsteadiness on feet  Rationale for Evaluation and Treatment: Rehabilitation  SUBJECTIVE:                                                                                                                                                                                              SUBJECTIVE STATEMENT:  Patient reports he is feeling good today. No changes since last session.    Pt accompanied by: significant other-Kurt Bailey  PERTINENT HISTORY: Patient is a 78 year old male with past medical history significant for Vascular dementia with other behavorial disturbances- refrreral to PT for unsteady gait worsened since bout with COVID-19 on 09/09/2023- Per [facility] discharge note on 09/19/2023: Hospital Course: Kurt Bailey is 78 y.o. male with alcohol abuse, diverticulitis who  presents to the ED with cough, chest congestion, dyspnea and myalgias.  Is also been having recurrent falls with generalized weakness per his daughter who brought him to the ER.  In the ED he was found to mildly tachycardic and hypertensive.  Labs mostly within normal limits.  Head CT is unremarkable, C-spine CT revealed severe degenerative changes with multilevel severe osseous neuroforaminal stenosis without acutely displaced fractures or traumatic listhesis.  Patient tested positive for COVID-19.  By reevaluation on 2/8 patient reported feeling weak but was overall all stable.  PAIN:  Are you having pain? No  PRECAUTIONS: Fall  RED FLAGS: None   WEIGHT BEARING RESTRICTIONS: No  FALLS: Has patient fallen in last 6 months? Yes. Number of falls 3  LIVING ENVIRONMENT: Lives with: lives with their family Lives in: House/apartment Stairs: Yes: External: 4 steps; can reach both Has following equipment at home: [name] - 2 wheeled and Grab bars  PLOF: Independent with community mobility with device, Needs assistance with ADLs, and Needs assistance with homemaking  PATIENT GOALS: Want to get around better.   OBJECTIVE:  Note: Objective measures were completed at Evaluation unless otherwise noted.  DIAGNOSTIC FINDINGS: CLINICAL DATA:  Neuro deficit, acute, stroke suspected; Neck trauma (Age >= 65y)  Pt to ed from home via [facility] for weakness   EXAM: CT HEAD WITHOUT CONTRAST   CT CERVICAL SPINE WITHOUT CONTRAST   TECHNIQUE: Multidetector CT imaging of the head and cervical spine was performed following the standard protocol without intravenous contrast. Multiplanar CT image reconstructions of the cervical spine were also generated.   RADIATION DOSE REDUCTION: This exam was performed according to the departmental dose-optimization program which includes automated exposure control, adjustment of the mA and/or kV according to patient size and/or use of iterative reconstruction technique.   COMPARISON:  MRI head 06/06/2023   FINDINGS: CT HEAD FINDINGS   Brain:   Cerebral ventricle sizes are concordant with the degree of cerebral volume loss. Patchy and confluent areas of decreased attenuation are noted throughout the deep and periventricular white matter of the cerebral hemispheres bilaterally, compatible with chronic microvascular ischemic disease. 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 No evidence of large-territorial acute infarction. No parenchymal hemorrhage. No mass lesion. No extra-axial collection.   No mass effect or midline shift. No hydrocephalus. Basilar cisterns are patent.   Vascular: No hyperdense vessel. Atherosclerotic calcifications are present within the cavernous internal carotid arteries.   Skull: No acute fracture or focal lesion.   Sinuses/Orbits: Bilateral maxillary, sphenoid, ethmoid sinus mucosal thickening. Otherwise paranasal sinuses and mastoid air cells are clear. The orbits are unremarkable.   Other: None.   CT CERVICAL SPINE FINDINGS   Alignment: Normal.   Skull base and vertebrae: Multilevel severe degenerative changes spine with associated multilevel severe osseous neural foraminal stenosis. No severe osseous central canal stenosis. No acute fracture. No aggressive appearing focal osseous  lesion or focal pathologic process.   Soft tissues and spinal canal: No prevertebral fluid or swelling. No visible canal hematoma.   Upper chest: Unremarkable.   Other: None.   IMPRESSION: 1. No acute intracranial abnormality. 2. No acute displaced fracture or traumatic listhesis of the cervical spine. 3. Multilevel severe degenerative changes spine with associated multilevel severe osseous neural foraminal stenosis.     Electronically Signed   By: Morgane  Naveau M.D.   On: 09/09/2023 21:42    COGNITION: Overall cognitive status: History of cognitive impairments - at baseline   SENSATION: WFL  COORDINATION:  Impaired.   EDEMA:  None observed   POSTURE: rounded shoulders and forward head  LOWER EXTREMITY ROM:     Active  Right Eval Left Eval  Hip flexion    Hip extension    Hip abduction    Hip adduction    Hip internal rotation    Hip external rotation    Knee flexion    Knee extension    Ankle dorsiflexion    Ankle plantarflexion    Ankle inversion    Ankle eversion     (Blank rows = not tested)  LOWER EXTREMITY MMT:    MMT Right Eval Left Eval  Hip flexion 4 4  Hip extension 4 4  Hip abduction 4 4  Hip adduction 4 4  Hip internal rotation 4 4  Hip external rotation 4 4  Knee flexion 4 4  Knee extension 4 4  Ankle dorsiflexion 3+ 4  Ankle plantarflexion    Ankle inversion    Ankle eversion    (Blank rows = not tested)  BED MOBILITY:  Not tested  TRANSFERS: Sit to stand: CGA  Assistive device utilized: None     Stand to sit: CGA  Assistive device utilized: None     Chair to chair: CGA  Assistive device utilized: None         STAIRS: Not tested GAIT: Findings: Gait Characteristics: decreased arm swing- Right, decreased arm swing- Left, decreased step length- Right, decreased step length- Left, decreased stance time- Right, decreased stance time- Left, and decreased stride length, Distance walked: > 100 feet, Assistive device  utilized:None, and Level of assistance: CGA  FUNCTIONAL TESTS:  5 times sit to stand: 15.71 sec without UE support Timed up and go (TUG): 14.98 and 15.71 sec without UE support =15.3 sec avg 6 minute walk test: To be assessed 2nd visit 10 meter walk test: 0.7 m/s avg without AD Berg Balance Scale: 42/56  PATIENT SURVEYS:  ABC scale To be assessed next visit                                                                                                                              TREATMENT DATE:    TherAct:  Step taps to 6 in step with green oval pad on step for improvement in R foot proprioception 3 x 10 R and then 3 x 10 L   STS 10x; airex pad in seat x 2 sets   Stepping over 1/2 foam rollers in // bars x several minutes, working on pt attention to task ( pt able to perform task okay but frequently stops completing properly)   Standing heel raises x 10 - incomplete ROM, moved to seated  - 2# AW 2 x 20 reps with feet on incline for improved ROM   Standing high march 2 x 15 reps ea LE with 2# AW    Neuro Re-ed: Standing with CGA next to support surface:  Airex pad: march  10x each LE; challenging for patient.  SLS practice 5 x 10 sec ea LE with UE support  X 2 min of alternating ea LE with as long of hold as ppossible 2 x 45 sec ea LE in romberg stance   PATIENT EDUCATION: Education details: PT plan of care; Purpose of PT as it relates to helping patient with strength, balance, HEP, Safety with mobility Person educated: Patient and Spouse Education method: Explanation, Demonstration, Tactile cues, Verbal cues, and Handouts Education comprehension: verbalized understanding and needs further education  HOME EXERCISE PROGRAM: Access Code: [email or web address] URL: [email or web address] Date: 11/23/2023 Prepared by: Ferrell Hu  Exercises - Seated Toe Raise  - 3 x weekly - 3 sets - 10 reps - Seated Heel Raise  - 3 x weekly - 3 sets - 10 reps - Standing Romberg  to 1/2 Tandem Stance  - 3 x weekly - 3 sets - 30 sec hold  GOALS:  Goals reviewed with patient? Yes  SHORT TERM GOALS: Target date: 01/04/2024  Pt will be independent with HEP in order to improve strength and balance in order to decrease fall risk and improve function at home and work.  Baseline: EVAL- NO formal HEP in place Goal status: INITIAL   LONG TERM GOALS: Target date: 02/15/2024   1.  Patient (> 60 years old) will complete five times sit to stand test in < 15 seconds indicating an increased LE strength and improved balance. Baseline: EVAL: 15.71 sec without UE support Goal status: INITIAL  2.  Patient will improve ABC score by 9 points   to demonstrate statistically significant improvement in mobility and quality of life as it relates to their confidence in his balance.  Baseline: EVAL- To be assessed visit #2 4/28: 41% Goal status: INITIAL   3.  Patient will increase Berg Balance score by > 6 points to demonstrate decreased fall risk during functional activities. Baseline: EVAL= 42/56 Goal status: INITIAL   4.   Patient will reduce timed up and go to <11 seconds to reduce fall risk and demonstrate improved transfer/gait ability. Baseline: 15.3 sec avg without AD Goal status: INITIAL  5.   Patient will increase 10 meter walk test to >1.51m/s as to improve gait speed for better community ambulation and to reduce fall risk. Baseline: EVAL= 0.7 m/s avg without AD Goal status: INITIAL  6.   Patient will increase six minute walk test distance to >1000 for progression to community ambulator and improve gait ability Baseline: To be assessed visit #2 4/28:  309 ft stopped at 3 minutes Goal status: INITIAL    ASSESSMENT:  CLINICAL IMPRESSION: Patient arrived with good motivation for completion of pt activities.    He require frequent cues for task orientation throughout session.  He has significant foot drag with fatigue and without constant attention to this gait  deviation.   Mr. Millam will benefit from skilled PT services to address these deficits in strength, mobility/balance and to improve his overall functional mobility and decrease risk for future falls.   OBJECTIVE IMPAIRMENTS: Abnormal gait, decreased activity tolerance, decreased balance, decreased cognition, decreased coordination, decreased endurance, decreased knowledge of condition, decreased knowledge of use of DME, decreased mobility, difficulty walking, decreased strength, and postural dysfunction.   ACTIVITY LIMITATIONS: carrying, lifting, bending, sitting, standing, squatting, stairs, and transfers  PARTICIPATION LIMITATIONS: meal prep, cleaning, laundry, driving, shopping, community activity, and yard work  PERSONAL FACTORS: 1-2 comorbidities: Mixed Alzheimers and vascular dementia, HTN  are also affecting patient's functional outcome.  REHAB POTENTIAL: Good  CLINICAL DECISION MAKING: Evolving/moderate complexity  EVALUATION COMPLEXITY: Moderate  PLAN:  PT FREQUENCY: 1-2x/week  PT DURATION: 12 weeks  PLANNED INTERVENTIONS: 97164- PT Re-evaluation, 97750- Physical Performance Testing, 97110-Therapeutic exercises, 97530- Therapeutic activity, [id number]- Neuromuscular re-education, 97535- Self Care, 13244- Manual therapy, [id number]- Gait training, [phone]- Orthotic Initial, [phone]- Orthotic/Prosthetic subsequent, [phone]- Canalith repositioning, [phone]- Electrical stimulation (manual), Patient/Family education, Balance training, Stair training, Taping, Dry Needling, Joint mobilization, Joint manipulation, Spinal manipulation, Spinal mobilization, Vestibular training, DME instructions, Cryotherapy, and Moist heat  PLAN FOR NEXT SESSION: balance and LE strengthening/coordination activities, Safety with mobility training including using AD as appropriate.    Edwina Gram, PT 12/07/2023, 3:57 PM

## 2023-12-08 NOTE — Therapy (Signed)
 OUTPATIENT PHYSICAL THERAPY NEURO TREATMENT   Patient Name: Kurt Bailey MRN: [id number] DOB:1945/10/22, 78 y.o., male Today's Date: 12/12/2023   PCP: Dr, Kurt Bailey REFERRING PROVIDER: Dr. Tretha Bailey  END OF SESSION:  PT End of Session - 12/12/23 1013     Visit Number 6    Number of Visits 25    Date for PT Re-Evaluation 02/15/24    Progress Note Due on Visit 10    PT Start Time 1015    PT Stop Time 1059    PT Time Calculation (min) 44 min    Equipment Utilized During Treatment Gait belt    Activity Tolerance Patient tolerated treatment well    Behavior During Therapy WFL for tasks assessed/performed                  Past Medical History:  Diagnosis Date   Acute hypoxemic respiratory failure due to COVID-19 (HCC) 05/13/2020   Acute urinary retention 09/14/2023   Alcohol abuse    Diverticulitis    Periumbilical hernia    History reviewed. No pertinent surgical history. 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) 09/19/2018    ONSET DATE: 09/09/2023  REFERRING DIAG:  Z86.16 (ICD-10-CM) - History of COVID-19  R26.81 (ICD-10-CM) - Unsteady gait    THERAPY DIAG:  Abnormality of gait and mobility  Difficulty in walking, not elsewhere classified  Muscle weakness (generalized)  Other abnormalities of gait and mobility  Rationale for Evaluation and Treatment: Rehabilitation  SUBJECTIVE:                                                                                                                                                                                              SUBJECTIVE STATEMENT:  Patient reports he has more snap crackles and pops due to the bad weather. Reports no falls, just a few sways.    Pt accompanied by: significant other-Kurt Bailey  PERTINENT HISTORY: Patient is a 78 year old male with past medical history significant for Vascular dementia with other behavorial disturbances- refrreral to PT for unsteady gait worsened since bout with COVID-19 on 09/09/2023- Per [facility] discharge note on 09/19/2023: Hospital Course: Kurt Bailey is 78 y.o.  male with alcohol abuse, diverticulitis who presents to the ED with cough, chest congestion, dyspnea and myalgias.  Is also been having recurrent falls with generalized weakness per his daughter who brought him to the ER.  In the ED he was found to mildly tachycardic and hypertensive.  Labs mostly within normal limits.  Head CT is unremarkable, C-spine CT revealed severe degenerative changes with multilevel severe osseous neuroforaminal stenosis without acutely displaced fractures or traumatic listhesis.  Patient tested positive for COVID-19.  By reevaluation on 2/8 patient reported feeling weak but was overall all stable.  PAIN:  Are you having pain? No  PRECAUTIONS: Fall  RED FLAGS: None   WEIGHT BEARING RESTRICTIONS: No  FALLS: Has patient fallen in last 6 months? Yes. Number of falls 3  LIVING ENVIRONMENT: Lives with: lives with their family Lives in: House/apartment Stairs: Yes: External: 4 steps; can reach both Has following equipment at home: [name] - 2 wheeled and Grab bars  PLOF: Independent with community mobility with device, Needs assistance with ADLs, and Needs assistance with homemaking  PATIENT GOALS: Want to get around better.   OBJECTIVE:  Note: Objective measures were completed at Evaluation unless otherwise noted.  DIAGNOSTIC FINDINGS: CLINICAL DATA:  Neuro deficit, acute, stroke  suspected; Neck trauma (Age >= 65y) Pt to ed from home via [facility] for weakness   EXAM: CT HEAD WITHOUT CONTRAST   CT CERVICAL SPINE WITHOUT CONTRAST   TECHNIQUE: Multidetector CT imaging of the head and cervical spine was performed following the standard protocol without intravenous contrast. Multiplanar CT image reconstructions of the cervical spine were also generated.   RADIATION DOSE REDUCTION: This exam was performed according to the departmental dose-optimization program which includes automated exposure control, adjustment of the mA and/or kV according to patient size and/or use of iterative reconstruction technique.   COMPARISON:  MRI head 06/06/2023   FINDINGS: CT HEAD FINDINGS   Brain:   Cerebral ventricle sizes are concordant with the degree of cerebral volume loss. Patchy and confluent areas of decreased attenuation are noted throughout the deep and periventricular white matter of the cerebral hemispheres bilaterally, compatible with chronic microvascular ischemic disease. 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 No evidence of large-territorial acute infarction. No parenchymal hemorrhage. No mass lesion. No extra-axial collection.   No mass effect or midline shift. No hydrocephalus. Basilar cisterns are patent.   Vascular: No hyperdense vessel. Atherosclerotic calcifications are present within the cavernous internal carotid arteries.   Skull: No acute fracture or focal lesion.   Sinuses/Orbits: Bilateral maxillary, sphenoid, ethmoid sinus mucosal thickening. Otherwise paranasal sinuses and mastoid air cells are clear. The orbits are unremarkable.   Other: None.   CT CERVICAL SPINE FINDINGS   Alignment: Normal.   Skull base and vertebrae: Multilevel severe degenerative changes spine with associated multilevel severe osseous neural foraminal stenosis. No severe osseous central canal stenosis. No acute fracture.  No aggressive appearing focal osseous lesion or focal pathologic process.   Soft tissues and spinal canal: No prevertebral fluid or swelling. No visible canal hematoma.   Upper chest: Unremarkable.   Other: None.   IMPRESSION: 1. No acute intracranial abnormality. 2. No acute displaced fracture or traumatic listhesis of the cervical spine. 3. Multilevel severe degenerative changes spine with associated multilevel severe osseous neural foraminal stenosis.     Electronically Signed   By: Morgane  Naveau M.D.   On: 09/09/2023 21:42    COGNITION: Overall cognitive status: History of cognitive impairments - at baseline  SENSATION: WFL  COORDINATION: Impaired.   EDEMA:  None observed   POSTURE: rounded shoulders and forward head  LOWER EXTREMITY ROM:     Active  Right Eval Left Eval  Hip flexion    Hip extension    Hip abduction    Hip adduction    Hip internal rotation    Hip external rotation    Knee flexion    Knee extension    Ankle dorsiflexion    Ankle plantarflexion    Ankle inversion    Ankle eversion     (Blank rows = not tested)  LOWER EXTREMITY MMT:    MMT Right Eval Left Eval  Hip flexion 4 4  Hip extension 4 4  Hip abduction 4 4  Hip adduction 4 4  Hip internal rotation 4 4  Hip external rotation 4 4  Knee flexion 4 4  Knee extension 4 4  Ankle dorsiflexion 3+ 4  Ankle plantarflexion    Ankle inversion    Ankle eversion    (Blank rows = not tested)  BED MOBILITY:  Not tested  TRANSFERS: Sit to stand: CGA  Assistive device utilized: None     Stand to sit: CGA  Assistive device utilized: None     Chair to chair: CGA  Assistive device utilized: None         STAIRS: Not tested GAIT: Findings: Gait Characteristics: decreased arm swing- Right, decreased arm swing- Left, decreased step length- Right, decreased step length- Left, decreased stance time- Right, decreased stance time- Left, and decreased stride length, Distance  walked: > 100 feet, Assistive device utilized:None, and Level of assistance: CGA  FUNCTIONAL TESTS:  5 times sit to stand: 15.71 sec without UE support Timed up and go (TUG): 14.98 and 15.71 sec without UE support =15.3 sec avg 6 minute walk test: To be assessed 2nd visit 10 meter walk test: 0.7 m/s avg without AD Berg Balance Scale: 42/56  PATIENT SURVEYS:  ABC scale To be assessed next visit                                                                                                                              TREATMENT DATE:    TherAct: 6" step: -toe taps 15x each LE -step up/down 10x each LE -lateral step up/down 10x each LE   TherEx:  3lb ankle weights: 2 sets  -march 10x each LE -LAQ 10x each LE -heel raise 15x each LE  Adduction ball squeeze 15x  Adduction ball squeeze with heel raises 15x  RTB PF 15x BTB abduction 15x BTB hamstring curl 15x  Neuro Re-ed: Standing with CGA next to support surface:  Airex pad: static stand 30 seconds x 2 trials, noticeable trembling of ankles/LE's with fatigue and challenge to maintain stability Airex pad: horizontal head turns 30 seconds scanning room 10x ; cueing for arc of motion  Airex pad: vertical head turns 30 seconds, cueing for arc of motion,  noticeable sway with upward gaze increasing demand on ankle righting reaction musculature Airex pad: one foot on 6" step one foot on airex pad, hold position for 30 seconds, switch legs, 2x each LE;   PATIENT EDUCATION: Education details: PT plan of care; Purpose of PT as it relates to helping patient with strength, balance, HEP, Safety with mobility Person educated: Patient and Spouse Education method: Explanation, Demonstration, Tactile cues, Verbal cues, and Handouts Education comprehension: verbalized understanding and needs further education  HOME EXERCISE PROGRAM: Access Code: [email or web address] URL: [email or web address] Date: 11/23/2023 Prepared by: Ferrell Hu  Exercises - Seated Toe Raise  - 3 x weekly - 3 sets - 10 reps - Seated Heel Raise  - 3 x weekly - 3 sets - 10 reps - Standing Romberg to 1/2 Tandem Stance  - 3 x weekly - 3 sets - 30 sec hold  GOALS:  Goals reviewed with patient? Yes  SHORT TERM GOALS: Target date: 01/04/2024  Pt will be independent with HEP in order to improve strength and balance in order to decrease fall risk and improve function at home and work.  Baseline: EVAL- NO formal HEP in place Goal status: INITIAL   LONG TERM GOALS: Target date: 02/15/2024   1.  Patient (> 72 years old) will complete five times sit to stand test in < 15 seconds indicating an increased LE strength and improved balance. Baseline: EVAL: 15.71 sec without UE support Goal status: INITIAL  2.  Patient will improve ABC score by 9 points   to demonstrate statistically significant improvement in mobility and quality of life as it relates to their confidence in his balance.  Baseline: EVAL- To be assessed visit #2 4/28: 41% Goal status: INITIAL   3.  Patient will increase Berg Balance score by > 6 points to demonstrate decreased fall risk during functional activities. Baseline: EVAL= 42/56 Goal status: INITIAL   4.   Patient will reduce timed up and go to <11 seconds to reduce fall risk and demonstrate improved transfer/gait ability. Baseline: 15.3 sec avg without AD Goal status: INITIAL  5.   Patient will increase 10 meter walk test to >1.48m/s as to improve gait speed for better community ambulation and to reduce fall risk. Baseline: EVAL= 0.7 m/s avg without AD Goal status: INITIAL  6.   Patient will increase six minute walk test distance to >1000 for progression to community ambulator and improve gait ability Baseline: To be assessed visit #2 4/28:  309 ft stopped at 3 minutes Goal status: INITIAL    ASSESSMENT:  CLINICAL IMPRESSION: Patient is challenged with carryover due to need for consistent task orientation  cueing. He has significant weakness of his RLE that impairs his lateral step up and requires visual cueing for arc of motion. Mr. Lippi will benefit from skilled PT services to address these deficits in strength, mobility/balance and to improve his overall functional mobility and decrease risk for future falls.   OBJECTIVE IMPAIRMENTS: Abnormal gait, decreased activity tolerance, decreased balance, decreased cognition, decreased coordination, decreased endurance, decreased knowledge of condition, decreased knowledge of use of DME, decreased mobility, difficulty walking, decreased strength, and postural dysfunction.   ACTIVITY LIMITATIONS: carrying, lifting, bending, sitting, standing, squatting, stairs, and transfers  PARTICIPATION LIMITATIONS: meal prep, cleaning, laundry, driving, shopping, community activity, and yard work  PERSONAL FACTORS: 1-2 comorbidities: Mixed Alzheimers and vascular dementia, HTN are also affecting patient's functional outcome.   REHAB POTENTIAL: Good  CLINICAL DECISION MAKING: Evolving/moderate complexity  EVALUATION COMPLEXITY: Moderate  PLAN:  PT FREQUENCY: 1-2x/week  PT DURATION: 12 weeks  PLANNED INTERVENTIONS: 97164- PT Re-evaluation, 97750- Physical Performance Testing, 97110-Therapeutic exercises, 97530- Therapeutic activity, [id number]- Neuromuscular re-education, 97535- Self Care, 16109- Manual therapy, [id number]- Gait training, [phone]- Orthotic Initial, [phone]- Orthotic/Prosthetic subsequent, [phone]- Canalith repositioning, [phone]- Electrical stimulation (manual), Patient/Family education, Balance training, Stair training, Taping, Dry Needling, Joint mobilization, Joint manipulation, Spinal manipulation, Spinal mobilization, Vestibular training, DME instructions, Cryotherapy, and Moist heat  PLAN FOR NEXT SESSION: balance and LE strengthening/coordination activities, Safety with mobility training including using AD as appropriate.    Ahlayah Tarkowski, PT 12/12/2023,  12:50 PM

## 2023-12-09 ENCOUNTER — Other Ambulatory Visit: Payer: Self-pay

## 2023-12-12 ENCOUNTER — Telehealth: Payer: Self-pay

## 2023-12-12 ENCOUNTER — Ambulatory Visit

## 2023-12-12 DIAGNOSIS — R2689 Other abnormalities of gait and mobility: Secondary | ICD-10-CM | POA: Diagnosis not present

## 2023-12-12 DIAGNOSIS — M6281 Muscle weakness (generalized): Secondary | ICD-10-CM | POA: Diagnosis not present

## 2023-12-12 DIAGNOSIS — R262 Difficulty in walking, not elsewhere classified: Secondary | ICD-10-CM | POA: Diagnosis not present

## 2023-12-12 DIAGNOSIS — R278 Other lack of coordination: Secondary | ICD-10-CM | POA: Diagnosis not present

## 2023-12-12 DIAGNOSIS — R269 Unspecified abnormalities of gait and mobility: Secondary | ICD-10-CM | POA: Diagnosis not present

## 2023-12-12 DIAGNOSIS — R2681 Unsteadiness on feet: Secondary | ICD-10-CM | POA: Diagnosis not present

## 2023-12-12 NOTE — Telephone Encounter (Signed)
 Noted.

## 2023-12-12 NOTE — Telephone Encounter (Signed)
 Copied from CRM [id number]. Topic: Clinical - Lab/Test Results >> Dec 12, 2023  4:09 PM Elle L wrote: Reason for CRM: The patient's wife, Taveon Steinwand, returned the call regarding the patient's lab results. I read the note verbatim and she expressed understanding.

## 2023-12-14 ENCOUNTER — Ambulatory Visit: Admitting: Physical Therapy

## 2023-12-14 DIAGNOSIS — R262 Difficulty in walking, not elsewhere classified: Secondary | ICD-10-CM | POA: Diagnosis not present

## 2023-12-14 DIAGNOSIS — M6281 Muscle weakness (generalized): Secondary | ICD-10-CM

## 2023-12-14 DIAGNOSIS — R2689 Other abnormalities of gait and mobility: Secondary | ICD-10-CM | POA: Diagnosis not present

## 2023-12-14 DIAGNOSIS — R2681 Unsteadiness on feet: Secondary | ICD-10-CM

## 2023-12-14 DIAGNOSIS — R269 Unspecified abnormalities of gait and mobility: Secondary | ICD-10-CM | POA: Diagnosis not present

## 2023-12-14 DIAGNOSIS — R278 Other lack of coordination: Secondary | ICD-10-CM | POA: Diagnosis not present

## 2023-12-14 NOTE — Therapy (Signed)
 OUTPATIENT PHYSICAL THERAPY NEURO TREATMENT   Patient Name: Kurt Bailey MRN: [id number] DOB:[birth year], 78 y.o., male Today's Date: 12/14/2023   PCP: Dr, Tretha Fu REFERRING PROVIDER: Dr. Tretha Fu  END OF SESSION:  PT End of Session - 12/14/23 1054     Visit Number 7    Number of Visits 25    Date for PT Re-Evaluation 02/15/24    Progress Note Due on Visit 10    PT Start Time 1050    PT Stop Time 1132    PT Time Calculation (min) 42 min    Equipment Utilized During Treatment Gait belt    Activity Tolerance Patient tolerated treatment well    Behavior During Therapy WFL for tasks assessed/performed                   Past Medical History:  Diagnosis Date   Acute hypoxemic respiratory failure due to COVID-19 (HCC) 05/13/2020   Acute urinary retention 09/14/2023   Alcohol abuse    Diverticulitis    Periumbilical hernia    No past surgical history on file. 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) 09/19/2018    ONSET DATE: 09/09/2023  REFERRING DIAG:  Z86.16 (ICD-10-CM) - History of COVID-19  R26.81 (ICD-10-CM) - Unsteady gait    THERAPY DIAG:  Abnormality of gait and mobility  Difficulty in walking, not elsewhere classified  Muscle weakness (generalized)  Other abnormalities of gait and mobility  Unsteadiness on feet  Rationale for Evaluation and Treatment: Rehabilitation  SUBJECTIVE:                                                                                                                                                                                              SUBJECTIVE STATEMENT:  Pt reports no changes since last session.    Pt accompanied by: significant other-LaTisha  PERTINENT HISTORY: Patient is a 78 year old male with past medical history significant for Vascular dementia with other behavorial disturbances- refrreral to PT for unsteady gait worsened since bout with COVID-19 on 09/09/2023- Per [facility] discharge note on 09/19/2023: Hospital Course: DELIA BEDILLION is 78 y.o. male with alcohol abuse, diverticulitis who presents to the  ED with cough, chest congestion, dyspnea and myalgias.  Is also been having recurrent falls with generalized weakness per his daughter who brought him to the ER.  In the ED he was found to mildly tachycardic and hypertensive.  Labs mostly within normal limits.  Head CT is unremarkable, C-spine CT revealed severe degenerative changes with multilevel severe osseous neuroforaminal stenosis without acutely displaced fractures or traumatic listhesis.  Patient tested positive for COVID-19.  By reevaluation on 2/8 patient reported feeling weak but was overall all stable.  PAIN:  Are you having pain? No  PRECAUTIONS: Fall  RED FLAGS: None   WEIGHT BEARING RESTRICTIONS: No  FALLS: Has patient fallen in last 6 months? Yes. Number of falls 3  LIVING ENVIRONMENT: Lives with: lives with their family Lives in: House/apartment Stairs: Yes: External: 4 steps; can reach both Has following equipment at home: [name] - 2 wheeled and Grab bars  PLOF: Independent with community mobility with device, Needs assistance with ADLs, and Needs assistance with homemaking  PATIENT GOALS: Want to get around better.   OBJECTIVE:  Note: Objective measures were completed at Evaluation unless otherwise noted.  DIAGNOSTIC FINDINGS: CLINICAL DATA:  Neuro deficit, acute, stroke suspected; Neck trauma (Age >= 65y) Pt to ed from home via  [facility] for weakness   EXAM: CT HEAD WITHOUT CONTRAST   CT CERVICAL SPINE WITHOUT CONTRAST   TECHNIQUE: Multidetector CT imaging of the head and cervical spine was performed following the standard protocol without intravenous contrast. Multiplanar CT image reconstructions of the cervical spine were also generated.   RADIATION DOSE REDUCTION: This exam was performed according to the departmental dose-optimization program which includes automated exposure control, adjustment of the mA and/or kV according to patient size and/or use of iterative reconstruction technique.   COMPARISON:  MRI head 06/06/2023   FINDINGS: CT HEAD FINDINGS   Brain:   Cerebral ventricle sizes are concordant with the degree of cerebral volume loss. Patchy and confluent areas of decreased attenuation are noted throughout the deep and periventricular white matter of the cerebral hemispheres bilaterally, compatible with chronic microvascular ischemic disease. 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 No evidence of large-territorial acute infarction. No parenchymal hemorrhage. No mass lesion. No extra-axial collection.   No mass effect or midline shift. No hydrocephalus. Basilar cisterns are patent.   Vascular: No hyperdense vessel. Atherosclerotic calcifications are present within the cavernous internal carotid arteries.   Skull: No acute fracture or focal lesion.   Sinuses/Orbits: Bilateral maxillary, sphenoid, ethmoid sinus mucosal thickening. Otherwise paranasal sinuses and mastoid air cells are clear. The orbits are unremarkable.   Other: None.   CT CERVICAL SPINE FINDINGS   Alignment: Normal.   Skull base and vertebrae: Multilevel severe degenerative changes spine with associated multilevel severe osseous neural foraminal stenosis. No severe osseous central canal stenosis. No acute fracture. No aggressive appearing focal osseous lesion or  focal pathologic process.   Soft tissues and spinal canal: No prevertebral fluid or swelling. No visible canal hematoma.   Upper chest: Unremarkable.   Other: None.   IMPRESSION: 1. No acute intracranial abnormality. 2. No acute displaced fracture or traumatic listhesis of the cervical spine. 3. Multilevel severe degenerative changes spine with associated multilevel severe osseous neural foraminal stenosis.     Electronically Signed   By: Morgane  Naveau M.D.   On: 09/09/2023 21:42    COGNITION: Overall cognitive status: History of cognitive impairments - at baseline   SENSATION: WFL  COORDINATION: Impaired.  EDEMA:  None observed   POSTURE: rounded shoulders and forward head  LOWER EXTREMITY ROM:     Active  Right Eval Left Eval  Hip flexion    Hip extension    Hip abduction    Hip adduction    Hip internal rotation    Hip external rotation    Knee flexion    Knee extension    Ankle dorsiflexion    Ankle plantarflexion    Ankle inversion    Ankle eversion     (Blank rows = not tested)  LOWER EXTREMITY MMT:    MMT Right Eval Left Eval  Hip flexion 4 4  Hip extension 4 4  Hip abduction 4 4  Hip adduction 4 4  Hip internal rotation 4 4  Hip external rotation 4 4  Knee flexion 4 4  Knee extension 4 4  Ankle dorsiflexion 3+ 4  Ankle plantarflexion    Ankle inversion    Ankle eversion    (Blank rows = not tested)  BED MOBILITY:  Not tested  TRANSFERS: Sit to stand: CGA  Assistive device utilized: None     Stand to sit: CGA  Assistive device utilized: None     Chair to chair: CGA  Assistive device utilized: None         STAIRS: Not tested GAIT: Findings: Gait Characteristics: decreased arm swing- Right, decreased arm swing- Left, decreased step length- Right, decreased step length- Left, decreased stance time- Right, decreased stance time- Left, and decreased stride length, Distance walked: > 100 feet, Assistive device utilized:None,  and Level of assistance: CGA  FUNCTIONAL TESTS:  5 times sit to stand: 15.71 sec without UE support Timed up and go (TUG): 14.98 and 15.71 sec without UE support =15.3 sec avg 6 minute walk test: To be assessed 2nd visit 10 meter walk test: 0.7 m/s avg without AD Berg Balance Scale: 42/56  PATIENT SURVEYS:  ABC scale To be assessed next visit                                                                                                                              TREATMENT DATE:    TherAct: 3lb ankle weights donned  18" step: -toe taps 3*10 -step up/down 2*10 each LE -lateral step up/down 2*10 each LE   TherEx:  3lb ankle weights: 2 sets  -LAQ 2*12 each LE -heel raise 2*10 each LE on incline BTB hamstring curl 15x  Neuro Re-ed: Standing with CGA next to support surface:  Airex pad: static stand 30 seconds x 2 trials, noticeable trembling of ankles/LE's with fatigue and challenge to maintain stability Airex pad: horizontal head turns 30 seconds scanning room 10x ; cueing for arc of motion  Airex pad: vertical head turns 30 seconds, cueing for arc of motion, noticeable sway with upward gaze increasing demand on ankle righting reaction musculature    PATIENT EDUCATION: Education details: PT plan of care; Purpose of PT  as it relates to helping patient with strength, balance, HEP, Safety with mobility Person educated: Patient and Spouse Education method: Explanation, Demonstration, Tactile cues, Verbal cues, and Handouts Education comprehension: verbalized understanding and needs further education  HOME EXERCISE PROGRAM: Access Code: [email or web address] URL: [email or web address] Date: 11/23/2023 Prepared by: Ferrell Hu  Exercises - Seated Toe Raise  - 3 x weekly - 3 sets - 10 reps - Seated Heel Raise  - 3 x weekly - 3 sets - 10 reps - Standing Romberg to 1/2 Tandem Stance  - 3 x weekly - 3 sets - 30 sec hold  GOALS:  Goals reviewed with patient?  Yes  SHORT TERM GOALS: Target date: 01/04/2024  Pt will be independent with HEP in order to improve strength and balance in order to decrease fall risk and improve function at home and work.  Baseline: EVAL- NO formal HEP in place Goal status: INITIAL   LONG TERM GOALS: Target date: 02/15/2024   1.  Patient (> 46 years old) will complete five times sit to stand test in < 15 seconds indicating an increased LE strength and improved balance. Baseline: EVAL: 15.71 sec without UE support Goal status: INITIAL  2.  Patient will improve ABC score by 9 points   to demonstrate statistically significant improvement in mobility and quality of life as it relates to their confidence in his balance.  Baseline: EVAL- To be assessed visit #2 4/28: 41% Goal status: INITIAL   3.  Patient will increase Berg Balance score by > 6 points to demonstrate decreased fall risk during functional activities. Baseline: EVAL= 42/56 Goal status: INITIAL   4.   Patient will reduce timed up and go to <11 seconds to reduce fall risk and demonstrate improved transfer/gait ability. Baseline: 15.3 sec avg without AD Goal status: INITIAL  5.   Patient will increase 10 meter walk test to >1.24m/s as to improve gait speed for better community ambulation and to reduce fall risk. Baseline: EVAL= 0.7 m/s avg without AD Goal status: INITIAL  6.   Patient will increase six minute walk test distance to >1000 for progression to community ambulator and improve gait ability Baseline: To be assessed visit #2 4/28:  309 ft stopped at 3 minutes Goal status: INITIAL    ASSESSMENT:  CLINICAL IMPRESSION: Patient is challenged with carryover due to need for consistent task orientation cueing. Utilized step for external cue for many exercises with good results but still had to cue pt to use RLE as instructed at times. All exercises with R LE require increased cueing for optimal performance and benefit from exercise.  Mr. Gephart will  benefit from skilled PT services to address these deficits in strength, mobility/balance and to improve his overall functional mobility and decrease risk for future falls.   OBJECTIVE IMPAIRMENTS: Abnormal gait, decreased activity tolerance, decreased balance, decreased cognition, decreased coordination, decreased endurance, decreased knowledge of condition, decreased knowledge of use of DME, decreased mobility, difficulty walking, decreased strength, and postural dysfunction.   ACTIVITY LIMITATIONS: carrying, lifting, bending, sitting, standing, squatting, stairs, and transfers  PARTICIPATION LIMITATIONS: meal prep, cleaning, laundry, driving, shopping, community activity, and yard work  PERSONAL FACTORS: 1-2 comorbidities: Mixed Alzheimers and vascular dementia, HTN are also affecting patient's functional outcome.   REHAB POTENTIAL: Good  CLINICAL DECISION MAKING: Evolving/moderate complexity  EVALUATION COMPLEXITY: Moderate  PLAN:  PT FREQUENCY: 1-2x/week  PT DURATION: 12 weeks  PLANNED INTERVENTIONS: 97164- PT Re-evaluation, 97750- Physical Performance Testing, 97110-Therapeutic exercises, 97530- Therapeutic activity, [id number]- Neuromuscular re-education,  16109- Self Care, [id number]- Manual therapy, [id number]- Gait training, [phone]- Orthotic Initial, [id number]- Orthotic/Prosthetic subsequent, [phone]- Canalith repositioning, [phone]- Electrical stimulation (manual), Patient/Family education, Balance training, Stair training, Taping, Dry Needling, Joint mobilization, Joint manipulation, Spinal manipulation, Spinal mobilization, Vestibular training, DME instructions, Cryotherapy, and Moist heat  PLAN FOR NEXT SESSION: balance and LE strengthening/coordination activities, Safety with mobility training including using AD as appropriate.    Edwina Gram, PT 12/14/2023, 10:57 AM

## 2023-12-19 ENCOUNTER — Ambulatory Visit: Admitting: Physical Therapy

## 2023-12-19 DIAGNOSIS — R2681 Unsteadiness on feet: Secondary | ICD-10-CM | POA: Diagnosis not present

## 2023-12-19 DIAGNOSIS — M6281 Muscle weakness (generalized): Secondary | ICD-10-CM

## 2023-12-19 DIAGNOSIS — R2689 Other abnormalities of gait and mobility: Secondary | ICD-10-CM | POA: Diagnosis not present

## 2023-12-19 DIAGNOSIS — R262 Difficulty in walking, not elsewhere classified: Secondary | ICD-10-CM | POA: Diagnosis not present

## 2023-12-19 DIAGNOSIS — R278 Other lack of coordination: Secondary | ICD-10-CM | POA: Diagnosis not present

## 2023-12-19 DIAGNOSIS — R269 Unspecified abnormalities of gait and mobility: Secondary | ICD-10-CM

## 2023-12-19 NOTE — Therapy (Signed)
 OUTPATIENT PHYSICAL THERAPY NEURO TREATMENT   Patient Name: Kurt Bailey MRN: [id number] DOB:05-Apr-1946, 78 y.o., male Today's Date: 12/19/2023   PCP: Dr, Kurt Bailey REFERRING PROVIDER: Dr. Tretha Bailey  END OF SESSION:   PT End of Session - 12/19/23 1018     Visit Number 8    Number of Visits 25    Date for PT Re-Evaluation 02/15/24    Progress Note Due on Visit 10    PT Start Time 1019    PT Stop Time 1100    PT Time Calculation (min) 41 min    Equipment Utilized During Treatment Gait belt    Activity Tolerance Patient tolerated treatment well    Behavior During Therapy WFL for tasks assessed/performed               Past Medical History:  Diagnosis Date   Acute hypoxemic respiratory failure due to COVID-19 (HCC) 05/13/2020   Acute urinary retention 09/14/2023   Alcohol abuse    Diverticulitis    Periumbilical hernia    No past surgical history on file. 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) 09/19/2018    ONSET DATE: 09/09/2023  REFERRING DIAG:  Z86.16 (ICD-10-CM) - History of COVID-19  R26.81 (ICD-10-CM) - Unsteady gait    THERAPY DIAG:  Unsteadiness on feet  Other abnormalities of gait and mobility  Abnormality of gait and mobility  Difficulty in walking, not elsewhere classified  Muscle weakness (generalized)  Other lack of coordination  Rationale for Evaluation and Treatment: Rehabilitation  SUBJECTIVE:                                                                                                                                                                                              SUBJECTIVE STATEMENT:   Pt reports "I'm doing OK, considering it is Monday morning." Denies pain. Pt's wife reports pt had 1 close call this past Friday when he was at [facility] where he stumbled, but couldn't remember what caused him to have LOB. But no falls.  Pt reports he enjoys doing things outside such as walking. Enjoys going to eat with his family.    Pt accompanied by: significant other-Kurt Bailey  PERTINENT HISTORY: Patient is a 78 year old male with past medical history significant for Vascular dementia with other behavorial disturbances- refrreral to PT for unsteady gait worsened since bout with COVID-19 on 09/09/2023- Per [facility] discharge note on 09/19/2023: Hospital Course: Kurt Bailey is 78 y.o. male with alcohol abuse, diverticulitis who presents to the ED with cough, chest congestion, dyspnea and myalgias.  Is also been having recurrent falls with generalized weakness per his daughter who brought him to the ER.  In the ED he was found to mildly tachycardic and hypertensive.  Labs mostly within normal limits.  Head CT is unremarkable, C-spine CT revealed severe degenerative changes with multilevel severe osseous neuroforaminal stenosis without acutely displaced fractures or traumatic listhesis.  Patient tested positive for COVID-19.  By reevaluation on 2/8 patient reported feeling weak but was overall all stable.  PAIN:  Are you having pain? No  PRECAUTIONS: Fall  RED FLAGS: None   WEIGHT BEARING RESTRICTIONS: No  FALLS: Has patient fallen in last 6 months? Yes. Number of falls 3  LIVING ENVIRONMENT: Lives with: lives with their family Lives in: House/apartment Stairs: Yes: External: 4 steps; can reach both Has following equipment at home: [name] - 2 wheeled and Grab bars  PLOF: Independent with community mobility with device, Needs assistance  with ADLs, and Needs assistance with homemaking  PATIENT GOALS: Want to get around better.   OBJECTIVE:  Note: Objective measures were completed at Evaluation unless otherwise noted.  DIAGNOSTIC FINDINGS: CLINICAL DATA:  Neuro deficit, acute, stroke suspected; Neck trauma (Age >= 65y) Pt to ed from home via [facility] for weakness   EXAM: CT HEAD WITHOUT CONTRAST   CT CERVICAL SPINE WITHOUT CONTRAST   TECHNIQUE: Multidetector CT imaging of the head and cervical spine was performed following the standard protocol without intravenous contrast. Multiplanar CT image reconstructions of the cervical spine were also generated.   RADIATION DOSE REDUCTION: This exam was performed according to the departmental dose-optimization program which includes automated exposure control, adjustment of the mA and/or kV according to patient size and/or use of iterative reconstruction technique.   COMPARISON:  MRI head 06/06/2023   FINDINGS: CT HEAD FINDINGS   Brain:   Cerebral ventricle sizes are concordant with the degree of cerebral volume loss. Patchy and confluent areas of decreased attenuation are noted throughout the deep and periventricular white matter of the cerebral hemispheres bilaterally, compatible with chronic microvascular ischemic disease. 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 No evidence of large-territorial acute infarction. No parenchymal hemorrhage. No mass lesion. No extra-axial collection.   No mass effect or midline shift. No hydrocephalus. Basilar cisterns are patent.   Vascular: No hyperdense vessel. Atherosclerotic calcifications are present within the cavernous internal carotid arteries.   Skull: No acute fracture or focal lesion.   Sinuses/Orbits: Bilateral maxillary, sphenoid, ethmoid sinus mucosal thickening. Otherwise paranasal sinuses and mastoid air cells are clear. The orbits are unremarkable.   Other: None.    CT CERVICAL SPINE FINDINGS   Alignment: Normal.   Skull base and vertebrae: Multilevel severe degenerative changes spine with associated multilevel severe osseous neural foraminal stenosis. No severe osseous central canal stenosis. No acute fracture. No aggressive appearing focal osseous lesion or focal pathologic process.   Soft tissues and spinal canal: No prevertebral fluid or swelling. No visible canal hematoma.   Upper chest: Unremarkable.   Other: None.   IMPRESSION: 1. No acute intracranial abnormality. 2. No acute displaced fracture or traumatic listhesis of the  cervical spine. 3. Multilevel severe degenerative changes spine with associated multilevel severe osseous neural foraminal stenosis.     Electronically Signed   By: Morgane  Naveau M.D.   On: 09/09/2023 21:42    COGNITION: Overall cognitive status: History of cognitive impairments - at baseline   SENSATION: WFL  COORDINATION: Impaired.   EDEMA:  None observed   POSTURE: rounded shoulders and forward head  LOWER EXTREMITY ROM:     Active  Right Eval Left Eval  Hip flexion    Hip extension    Hip abduction    Hip adduction    Hip internal rotation    Hip external rotation    Knee flexion    Knee extension    Ankle dorsiflexion    Ankle plantarflexion    Ankle inversion    Ankle eversion     (Blank rows = not tested)  LOWER EXTREMITY MMT:    MMT Right Eval Left Eval  Hip flexion 4 4  Hip extension 4 4  Hip abduction 4 4  Hip adduction 4 4  Hip internal rotation 4 4  Hip external rotation 4 4  Knee flexion 4 4  Knee extension 4 4  Ankle dorsiflexion 3+ 4  Ankle plantarflexion    Ankle inversion    Ankle eversion    (Blank rows = not tested)  BED MOBILITY:  Not tested  TRANSFERS: Sit to stand: CGA  Assistive device utilized: None     Stand to sit: CGA  Assistive device utilized: None     Chair to chair: CGA  Assistive device utilized: None         STAIRS: Not  tested GAIT: Findings: Gait Characteristics: decreased arm swing- Right, decreased arm swing- Left, decreased step length- Right, decreased step length- Left, decreased stance time- Right, decreased stance time- Left, and decreased stride length, Distance walked: > 100 feet, Assistive device utilized:None, and Level of assistance: CGA  FUNCTIONAL TESTS:  5 times sit to stand: 15.71 sec without UE support Timed up and go (TUG): 14.98 and 15.71 sec without UE support =15.3 sec avg 6 minute walk test: To be assessed 2nd visit 10 meter walk test: 0.7 m/s avg without AD Berg Balance Scale: 42/56  PATIENT SURVEYS:  ABC scale To be assessed next visit                                                                                                                              TREATMENT DATE:   Unless otherwise stated, CGA was provided and gait belt donned in order to ensure pt safety throughout session.  Gait into therapy clinic, no AD, demonstrating slow gait speed, with R LE shuffling more than LLE but overall decreased step lengths bilaterally.  Dynamic gait training ~38ft with focus on:  Fast forward walking and larger step lengths Continues to have poor R LE foot clearance compared to L Added dual-task challenge of head turns to identify  targets on walls, pt unable to sustain fast gait speed with a dual-task and has difficulty identifying the targets (trouble with recall of what the letters are)  HR 70bpm, SpO2 96%  Repeated sit<>stands from green chair 2x 10 reps, no UE support, to increase pt's HR and focus on functional B LE strength    Donned 4lb AW to R LE Stair navigation training ascending/descending 4 steps x3 reps without break (6" height) using B HRs with CGA assist - cuing for reciprocal stepping pattern on ascent for R LE NMR and then pt performing step-to on descent leading with R LE  HR 78bpm and SpO2 96%   Dynamic gait training using agility ladder including the  following:  Wearing 4lb AW on R LE throughout Forward reciprocal stepping Pt continues to have greater difficulty taking large enough forward step with R LE Down/back x3 reps to improve R LE stepping with mod cuing Side stepping down/back x2 reps  Continues to have greater difficulty stepping R LE large enough when going towards the R Backwards step-to leading with each LE (transitioned to step-to, but alternating lead LE) Down/back x2 reps Requires mod cuing to understand how to alternate step-to gait Requires CGA/light min A for balance throughout Requires min/mod cuing throughout to perform with proper sequence/technique  Dynamic stepping balance challenge: Forward/backwards stepping over 1/2 foam roll wearing 4lb AW on R LE Performed near balance bar with pt using UE support ~50% of the time Requires CGA/light min A for steadying   PATIENT EDUCATION: Education details: PT plan of care; Purpose of PT as it relates to helping patient with strength, balance, HEP, Safety with mobility Person educated: Patient and Spouse Education method: Explanation, Demonstration, Tactile cues, Verbal cues, and Handouts Education comprehension: verbalized understanding and needs further education  HOME EXERCISE PROGRAM: Access Code: [email or web address] URL: [email or web address] Date: 11/23/2023 Prepared by: Ferrell Hu  Exercises - Seated Toe Raise  - 3 x weekly - 3 sets - 10 reps - Seated Heel Raise  - 3 x weekly - 3 sets - 10 reps - Standing Romberg to 1/2 Tandem Stance  - 3 x weekly - 3 sets - 30 sec hold  GOALS:  Goals reviewed with patient? Yes  SHORT TERM GOALS: Target date: 01/04/2024  Pt will be independent with HEP in order to improve strength and balance in order to decrease fall risk and improve function at home and work.  Baseline: EVAL- NO formal HEP in place Goal status: INITIAL   LONG TERM GOALS: Target date: 02/15/2024   1.  Patient (> 45 years old) will  complete five times sit to stand test in < 15 seconds indicating an increased LE strength and improved balance. Baseline: EVAL: 15.71 sec without UE support Goal status: INITIAL  2.  Patient will improve ABC score by 9 points   to demonstrate statistically significant improvement in mobility and quality of life as it relates to their confidence in his balance.  Baseline: EVAL- To be assessed visit #2 4/28: 41% Goal status: INITIAL   3.  Patient will increase Berg Balance score by > 6 points to demonstrate decreased fall risk during functional activities. Baseline: EVAL= 42/56 Goal status: INITIAL   4.   Patient will reduce timed up and go to <11 seconds to reduce fall risk and demonstrate improved transfer/gait ability. Baseline: 15.3 sec avg without AD Goal status: INITIAL  5.   Patient will increase 10 meter walk test to >1.63m/s as to improve gait speed for  better community ambulation and to reduce fall risk. Baseline: EVAL= 0.7 m/s avg without AD Goal status: INITIAL  6.   Patient will increase six minute walk test distance to >1000 for progression to community ambulator and improve gait ability Baseline: To be assessed visit #2 4/28:  309 ft stopped at 3 minutes Goal status: INITIAL    ASSESSMENT:  CLINICAL IMPRESSION:  Patient arrived motivated to participate in therapy session. Patient continues to demonstrate R LE weakness compared to LLE with pt having poorer R LE foot clearance and smaller step length during gait. Patient continues to benefit from increased cuing to recall how to properly perform interventions, but with cuing is able to execute correctly. Therapy session focused on increased intensity of gait training with external targets to promote improved R LE foot clearance and step length while wearing ankle weight to promote increased neural recruitment. Patient does report B LE fatigue towards end of session. Mr. Mottola will benefit from skilled PT services to  address these deficits in strength, mobility/balance and to improve his overall functional mobility and decrease risk for future falls.   OBJECTIVE IMPAIRMENTS: Abnormal gait, decreased activity tolerance, decreased balance, decreased cognition, decreased coordination, decreased endurance, decreased knowledge of condition, decreased knowledge of use of DME, decreased mobility, difficulty walking, decreased strength, and postural dysfunction.   ACTIVITY LIMITATIONS: carrying, lifting, bending, sitting, standing, squatting, stairs, and transfers  PARTICIPATION LIMITATIONS: meal prep, cleaning, laundry, driving, shopping, community activity, and yard work  PERSONAL FACTORS: 1-2 comorbidities: Mixed Alzheimers and vascular dementia, HTN are also affecting patient's functional outcome.   REHAB POTENTIAL: Good  CLINICAL DECISION MAKING: Evolving/moderate complexity  EVALUATION COMPLEXITY: Moderate  PLAN:  PT FREQUENCY: 1-2x/week  PT DURATION: 12 weeks  PLANNED INTERVENTIONS: 97164- PT Re-evaluation, 97750- Physical Performance Testing, 97110-Therapeutic exercises, 97530- Therapeutic activity, [id number]- Neuromuscular re-education, 97535- Self Care, 16109- Manual therapy, [id number]- Gait training, [phone]- Orthotic Initial, [phone]- Orthotic/Prosthetic subsequent, [phone]- Canalith repositioning, [phone]- Electrical stimulation (manual), Patient/Family education, Balance training, Stair training, Taping, Dry Needling, Joint mobilization, Joint manipulation, Spinal manipulation, Spinal mobilization, Vestibular training, DME instructions, Cryotherapy, and Moist heat  PLAN FOR NEXT SESSION:  - dynamic stepping balance over obstacles  - has poor R LE foot clearance - pt reports he enjoys doing things outside - dynamic gait training  - could try resisted walking balance and LE strengthening/coordination activities, Safety with mobility training including using AD as appropriate.     Carlen Chasten, PT, DPT, NCS,  CSRS Physical Therapist - [place]  [facility]  4:44 PM 12/19/23

## 2023-12-20 NOTE — Therapy (Signed)
 OUTPATIENT PHYSICAL THERAPY NEURO TREATMENT   Patient Name: Kurt Bailey MRN: [id number] DOB:Jul 14, 1946, 78 y.o., male Today's Date: 12/21/2023   PCP: Dr, Tretha Fu REFERRING PROVIDER: Dr. Tretha Fu  END OF SESSION:   PT End of Session - 12/21/23 0846     Visit Number 9    Number of Visits 25    Date for PT Re-Evaluation 02/15/24    Progress Note Due on Visit 10    PT Start Time 0845    PT Stop Time 0929    PT Time Calculation (min) 44 min    Equipment Utilized During Treatment Gait belt    Activity Tolerance Patient tolerated treatment well    Behavior During Therapy [facility] for tasks assessed/performed                Past Medical History:  Diagnosis Date   Acute hypoxemic respiratory failure due to COVID-19 (HCC) 05/13/2020   Acute urinary retention 09/14/2023   Alcohol abuse    Diverticulitis    Periumbilical hernia    History reviewed. No pertinent surgical history. 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) 09/19/2018    ONSET DATE: 09/09/2023  REFERRING DIAG:  Z86.16 (ICD-10-CM) - History of COVID-19  R26.81 (ICD-10-CM) - Unsteady gait    THERAPY DIAG:  Other abnormalities of gait and mobility  Abnormality of gait and mobility  Difficulty in walking, not elsewhere classified  Muscle weakness (generalized)  Rationale for Evaluation and Treatment: Rehabilitation  SUBJECTIVE:                                                                                                                                                                                              SUBJECTIVE STATEMENT:   Patient reports no falls. Had a good weekend.    Pt accompanied by: significant other-LaTisha  PERTINENT HISTORY: Patient is a 78 year old male with past medical history significant for Vascular dementia with other behavorial disturbances- refrreral to PT for unsteady gait worsened since bout with COVID-19 on 09/09/2023- Per [facility] discharge note on 09/19/2023: Hospital Course: Kurt Bailey is 78 y.o. male with alcohol abuse, diverticulitis who presents to the ED with cough, chest  congestion, dyspnea and myalgias.  Is also been having recurrent falls with generalized weakness per his daughter who brought him to the ER.  In the ED he was found to mildly tachycardic and hypertensive.  Labs mostly within normal limits.  Head CT is unremarkable, C-spine CT revealed severe degenerative changes with multilevel severe osseous neuroforaminal stenosis without acutely displaced fractures or traumatic listhesis.  Patient tested positive for COVID-19.  By reevaluation on 2/8 patient reported feeling weak but was overall all stable.  PAIN:  Are you having pain? No  PRECAUTIONS: Fall  RED FLAGS: None   WEIGHT BEARING RESTRICTIONS: No  FALLS: Has patient fallen in last 6 months? Yes. Number of falls 3  LIVING ENVIRONMENT: Lives with: lives with their family Lives in: House/apartment Stairs: Yes: External: 4 steps; can reach both Has following equipment at home: [name] - 2 wheeled and Grab bars  PLOF: Independent with community mobility with device, Needs assistance with ADLs, and Needs assistance with homemaking  PATIENT GOALS: Want to get around better.   OBJECTIVE:  Note: Objective measures were completed at Evaluation unless otherwise noted.  DIAGNOSTIC FINDINGS: CLINICAL DATA:  Neuro deficit, acute, stroke suspected; Neck trauma (Age >= 65y) Pt to ed from home via [facility] for  weakness   EXAM: CT HEAD WITHOUT CONTRAST   CT CERVICAL SPINE WITHOUT CONTRAST   TECHNIQUE: Multidetector CT imaging of the head and cervical spine was performed following the standard protocol without intravenous contrast. Multiplanar CT image reconstructions of the cervical spine were also generated.   RADIATION DOSE REDUCTION: This exam was performed according to the departmental dose-optimization program which includes automated exposure control, adjustment of the mA and/or kV according to patient size and/or use of iterative reconstruction technique.   COMPARISON:  MRI head 06/06/2023   FINDINGS: CT HEAD FINDINGS   Brain:   Cerebral ventricle sizes are concordant with the degree of cerebral volume loss. Patchy and confluent areas of decreased attenuation are noted throughout the deep and periventricular white matter of the cerebral hemispheres bilaterally, compatible with chronic microvascular ischemic disease. 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 No evidence of large-territorial acute infarction. No parenchymal hemorrhage. No mass lesion. No extra-axial collection.   No mass effect or midline shift. No hydrocephalus. Basilar cisterns are patent.   Vascular: No hyperdense vessel. Atherosclerotic calcifications are present within the cavernous internal carotid arteries.   Skull: No acute fracture or focal lesion.   Sinuses/Orbits: Bilateral maxillary, sphenoid, ethmoid sinus mucosal thickening. Otherwise paranasal sinuses and mastoid air cells are clear. The orbits are unremarkable.   Other: None.   CT CERVICAL SPINE FINDINGS   Alignment: Normal.   Skull base and vertebrae: Multilevel severe degenerative changes spine with associated multilevel severe osseous neural foraminal stenosis. No severe osseous central canal stenosis. No acute fracture. No aggressive appearing focal osseous lesion or focal pathologic  process.   Soft tissues and spinal canal: No prevertebral fluid or swelling. No visible canal hematoma.   Upper chest: Unremarkable.   Other: None.   IMPRESSION: 1. No acute intracranial abnormality. 2. No acute displaced fracture or traumatic listhesis of the cervical spine. 3. Multilevel severe degenerative changes spine with associated multilevel severe osseous neural foraminal stenosis.     Electronically Signed   By: Morgane  Naveau M.D.   On: 09/09/2023 21:42    COGNITION: Overall cognitive status: History of cognitive impairments - at baseline   SENSATION: WFL  COORDINATION: Impaired.   EDEMA:  None observed  POSTURE: rounded shoulders and forward head  LOWER EXTREMITY ROM:     Active  Right Eval Left Eval  Hip flexion    Hip extension    Hip abduction    Hip adduction    Hip internal rotation    Hip external rotation    Knee flexion    Knee extension    Ankle dorsiflexion    Ankle plantarflexion    Ankle inversion    Ankle eversion     (Blank rows = not tested)  LOWER EXTREMITY MMT:    MMT Right Eval Left Eval  Hip flexion 4 4  Hip extension 4 4  Hip abduction 4 4  Hip adduction 4 4  Hip internal rotation 4 4  Hip external rotation 4 4  Knee flexion 4 4  Knee extension 4 4  Ankle dorsiflexion 3+ 4  Ankle plantarflexion    Ankle inversion    Ankle eversion    (Blank rows = not tested)  BED MOBILITY:  Not tested  TRANSFERS: Sit to stand: CGA  Assistive device utilized: None     Stand to sit: CGA  Assistive device utilized: None     Chair to chair: CGA  Assistive device utilized: None         STAIRS: Not tested GAIT: Findings: Gait Characteristics: decreased arm swing- Right, decreased arm swing- Left, decreased step length- Right, decreased step length- Left, decreased stance time- Right, decreased stance time- Left, and decreased stride length, Distance walked: > 100 feet, Assistive device utilized:None, and Level of  assistance: CGA  FUNCTIONAL TESTS:  5 times sit to stand: 15.71 sec without UE support Timed up and go (TUG): 14.98 and 15.71 sec without UE support =15.3 sec avg 6 minute walk test: To be assessed 2nd visit 10 meter walk test: 0.7 m/s avg without AD Berg Balance Scale: 42/56  PATIENT SURVEYS:  ABC scale To be assessed next visit                                                                                                                              TREATMENT DATE:   TherAct: 3lb ankle weights donned  6" step: -toe taps 3*10 -step up/down 2*10 each LE -lateral step up/down 2*10 each LE   Ambulate 160 ft with CGA with 3lb AW; max cueing for foot clearance 10x STS arms crossed ; 2 sets Standing heel raises 10x, toe raises 10x    TherEx:  3lb ankle weights: 2 sets  -LAQ 2*12 each LE -hip flexion 10x; each LE; 2 sets  -heel raise 2*10 each LE BTB hamstring curl 15x BTB abduction 15x Adduction squeezes 15x Adduction with heel raise 15x Adduction with IR/ER 15x    Neuro Re-ed: Standing with CGA next to support surface:  Airex pad: static stand 30 seconds x 2 trials, noticeable trembling of ankles/LE's with fatigue and challenge to maintain stability Airex pad: horizontal head turns 30 seconds scanning room  10x ; cueing for arc of motion  Airex pad: vertical head turns 30 seconds, cueing for arc of motion, noticeable sway with upward gaze increasing demand on ankle righting reaction musculature Single leg stance with finger tip support 30 seconds each LE; 2 sets     PATIENT EDUCATION: Education details: PT plan of care; Purpose of PT as it relates to helping patient with strength, balance, HEP, Safety with mobility Person educated: Patient and Spouse Education method: Explanation, Demonstration, Tactile cues, Verbal cues, and Handouts Education comprehension: verbalized understanding and needs further education  HOME EXERCISE PROGRAM: Access Code: [email or web address] URL:  [email or web address] Date: 11/23/2023 Prepared by: Ferrell Hu  Exercises - Seated Toe Raise  - 3 x weekly - 3 sets - 10 reps - Seated Heel Raise  - 3 x weekly - 3 sets - 10 reps - Standing Romberg to 1/2 Tandem Stance  - 3 x weekly - 3 sets - 30 sec hold  GOALS:  Goals reviewed with patient? Yes  SHORT TERM GOALS: Target date: 01/04/2024  Pt will be independent with HEP in order to improve strength and balance in order to decrease fall risk and improve function at home and work.  Baseline: EVAL- NO formal HEP in place Goal status: INITIAL   LONG TERM GOALS: Target date: 02/15/2024   1.  Patient (> 52 years old) will complete five times sit to stand test in < 15 seconds indicating an increased LE strength and improved balance. Baseline: EVAL: 15.71 sec without UE support Goal status: INITIAL  2.  Patient will improve ABC score by 9 points   to demonstrate statistically significant improvement in mobility and quality of life as it relates to their confidence in his balance.  Baseline: EVAL- To be assessed visit #2 4/28: 41% Goal status: INITIAL   3.  Patient will increase Berg Balance score by > 6 points to demonstrate decreased fall risk during functional activities. Baseline: EVAL= 42/56 Goal status: INITIAL   4.   Patient will reduce timed up and go to <11 seconds to reduce fall risk and demonstrate improved transfer/gait ability. Baseline: 15.3 sec avg without AD Goal status: INITIAL  5.   Patient will increase 10 meter walk test to >1.67m/s as to improve gait speed for better community ambulation and to reduce fall risk. Baseline: EVAL= 0.7 m/s avg without AD Goal status: INITIAL  6.   Patient will increase six minute walk test distance to >1000 for progression to community ambulator and improve gait ability Baseline: To be assessed visit #2 4/28:  309 ft stopped at 3 minutes Goal status: INITIAL    ASSESSMENT:  CLINICAL IMPRESSION:  Patient  requires max cueing for foot drag to decrease risk of falling. He has limited carryover between sessions. He is fatigued throughout session and requires intermittent rest breaks. Mr. Rosenberry will benefit from skilled PT services to address these deficits in strength, mobility/balance and to improve his overall functional mobility and decrease risk for future falls.   OBJECTIVE IMPAIRMENTS: Abnormal gait, decreased activity tolerance, decreased balance, decreased cognition, decreased coordination, decreased endurance, decreased knowledge of condition, decreased knowledge of use of DME, decreased mobility, difficulty walking, decreased strength, and postural dysfunction.   ACTIVITY LIMITATIONS: carrying, lifting, bending, sitting, standing, squatting, stairs, and transfers  PARTICIPATION LIMITATIONS: meal prep, cleaning, laundry, driving, shopping, community activity, and yard work  PERSONAL FACTORS: 1-2 comorbidities: Mixed Alzheimers and vascular dementia, HTN are also affecting patient's functional outcome.   REHAB POTENTIAL: Good  CLINICAL DECISION  MAKING: Evolving/moderate complexity  EVALUATION COMPLEXITY: Moderate  PLAN:  PT FREQUENCY: 1-2x/week  PT DURATION: 12 weeks  PLANNED INTERVENTIONS: 97164- PT Re-evaluation, 97750- Physical Performance Testing, 97110-Therapeutic exercises, 97530- Therapeutic activity, 97112- Neuromuscular re-education, 97535- Self Care, 40981- Manual therapy, [phone]- Gait training, [phone]- Orthotic Initial, [phone]- Orthotic/Prosthetic subsequent, [phone]- Canalith repositioning, [phone]- Electrical stimulation (manual), Patient/Family education, Balance training, Stair training, Taping, Dry Needling, Joint mobilization, Joint manipulation, Spinal manipulation, Spinal mobilization, Vestibular training, DME instructions, Cryotherapy, and Moist heat  PLAN FOR NEXT SESSION:  Progress note    Rolande Moe  Brain Cahill, PT, DPT Physical Therapist - [facility]  Outpatient Physical Therapy- Main Campus [phone]    10:00 AM 12/21/23

## 2023-12-21 ENCOUNTER — Ambulatory Visit

## 2023-12-21 DIAGNOSIS — R2689 Other abnormalities of gait and mobility: Secondary | ICD-10-CM

## 2023-12-21 DIAGNOSIS — R262 Difficulty in walking, not elsewhere classified: Secondary | ICD-10-CM | POA: Diagnosis not present

## 2023-12-21 DIAGNOSIS — R269 Unspecified abnormalities of gait and mobility: Secondary | ICD-10-CM | POA: Diagnosis not present

## 2023-12-21 DIAGNOSIS — R278 Other lack of coordination: Secondary | ICD-10-CM | POA: Diagnosis not present

## 2023-12-21 DIAGNOSIS — M6281 Muscle weakness (generalized): Secondary | ICD-10-CM | POA: Diagnosis not present

## 2023-12-21 DIAGNOSIS — R2681 Unsteadiness on feet: Secondary | ICD-10-CM | POA: Diagnosis not present

## 2023-12-22 DIAGNOSIS — R011 Cardiac murmur, unspecified: Secondary | ICD-10-CM | POA: Diagnosis not present

## 2023-12-22 DIAGNOSIS — G309 Alzheimer's disease, unspecified: Secondary | ICD-10-CM | POA: Diagnosis not present

## 2023-12-22 DIAGNOSIS — Z6832 Body mass index (BMI) 32.0-32.9, adult: Secondary | ICD-10-CM | POA: Diagnosis not present

## 2023-12-22 DIAGNOSIS — F01518 Vascular dementia, unspecified severity, with other behavioral disturbance: Secondary | ICD-10-CM | POA: Diagnosis not present

## 2023-12-22 DIAGNOSIS — E6609 Other obesity due to excess calories: Secondary | ICD-10-CM | POA: Diagnosis not present

## 2023-12-22 DIAGNOSIS — E782 Mixed hyperlipidemia: Secondary | ICD-10-CM | POA: Diagnosis not present

## 2023-12-22 DIAGNOSIS — I10 Essential (primary) hypertension: Secondary | ICD-10-CM | POA: Diagnosis not present

## 2023-12-22 DIAGNOSIS — Z6831 Body mass index (BMI) 31.0-31.9, adult: Secondary | ICD-10-CM | POA: Diagnosis not present

## 2023-12-22 DIAGNOSIS — F02818 Dementia in other diseases classified elsewhere, unspecified severity, with other behavioral disturbance: Secondary | ICD-10-CM | POA: Diagnosis not present

## 2023-12-22 DIAGNOSIS — E66811 Obesity, class 1: Secondary | ICD-10-CM | POA: Diagnosis not present

## 2023-12-27 ENCOUNTER — Ambulatory Visit: Admitting: Physical Therapy

## 2023-12-27 DIAGNOSIS — M6281 Muscle weakness (generalized): Secondary | ICD-10-CM | POA: Diagnosis not present

## 2023-12-27 DIAGNOSIS — R278 Other lack of coordination: Secondary | ICD-10-CM

## 2023-12-27 DIAGNOSIS — R2689 Other abnormalities of gait and mobility: Secondary | ICD-10-CM

## 2023-12-27 DIAGNOSIS — R262 Difficulty in walking, not elsewhere classified: Secondary | ICD-10-CM | POA: Diagnosis not present

## 2023-12-27 DIAGNOSIS — R269 Unspecified abnormalities of gait and mobility: Secondary | ICD-10-CM | POA: Diagnosis not present

## 2023-12-27 DIAGNOSIS — R2681 Unsteadiness on feet: Secondary | ICD-10-CM | POA: Diagnosis not present

## 2023-12-27 NOTE — Therapy (Unsigned)
 OUTPATIENT PHYSICAL THERAPY NEURO TREATMENT/  PHYSICAL THERAPY PROGRESS NOTE   Dates of reporting period  11/23/23   to   12/27/2023     Patient Name: Kurt Bailey MRN: [id number] DOB:02-05-1946, 78 y.o., male Today's Date: 12/27/2023   PCP: Dr, Kurt Bailey REFERRING PROVIDER: Dr. Tretha Bailey  END OF SESSION:   PT End of Session - 12/27/23 1013     Visit Number 10    Number of Visits 25    Date for PT Re-Evaluation 02/15/24    Progress Note Due on Visit 10    PT Start Time 1019    PT Stop Time 1059    PT Time Calculation (min) 40 min    Equipment Utilized During Treatment Gait belt    Activity Tolerance Patient tolerated treatment well    Behavior During Therapy WFL for tasks assessed/performed                Past Medical History:  Diagnosis Date   Acute hypoxemic respiratory failure due to COVID-19 (HCC) 05/13/2020   Acute urinary retention 09/14/2023   Alcohol abuse    Diverticulitis    Periumbilical hernia    No past surgical history on file. 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) 09/19/2018    ONSET DATE: 09/09/2023  REFERRING DIAG:  Z86.16 (ICD-10-CM) - History of COVID-19  R26.81 (ICD-10-CM) - Unsteady gait    THERAPY DIAG:  Other abnormalities of gait and mobility  Difficulty in walking, not elsewhere classified  Abnormality of gait and mobility  Muscle weakness (generalized)  Unsteadiness on feet  Other lack of coordination  Rationale for Evaluation and  Treatment: Rehabilitation  SUBJECTIVE:                                                                                                                                                                                             SUBJECTIVE STATEMENT:   Pt reports that he is doing well. No pain at start of PT treatment. No falls reported by pt over the weekend, but was able to go out to eat once over the weekend.  States that he feels like his balance is getting better.  Pt accompanied by: significant other-Kurt Bailey  PERTINENT HISTORY: Patient is a 77 year old male with past medical history significant for Vascular dementia with other behavorial disturbances- refrreral to PT for unsteady gait worsened since bout with COVID-19 on 09/09/2023- Per [facility] discharge note on 09/19/2023: Hospital Course: Kurt Bailey is 78 y.o. male with alcohol abuse, diverticulitis who presents to the ED with cough, chest congestion, dyspnea and myalgias.  Is also been having recurrent falls with generalized weakness per his daughter who brought him to the ER.  In the ED he was found to mildly tachycardic and hypertensive.  Labs mostly within normal limits.  Head CT is unremarkable, C-spine CT revealed severe degenerative changes with multilevel severe osseous neuroforaminal stenosis without acutely displaced fractures or traumatic listhesis.  Patient tested positive for COVID-19.  By reevaluation on 2/8 patient reported feeling weak but was overall all stable.  PAIN:  Are you having pain? No  PRECAUTIONS: Fall  RED FLAGS: None   WEIGHT BEARING RESTRICTIONS: No  FALLS: Has patient fallen in last 6 months? Yes. Number of falls 3  LIVING ENVIRONMENT: Lives with: lives with their family Lives in: House/apartment Stairs: Yes: External: 4 steps; can reach both Has following equipment at home: [name] - 2 wheeled and Grab bars  PLOF: Independent with community mobility with device, Needs assistance with ADLs,  and Needs assistance with homemaking  PATIENT GOALS: Want to get around better.   OBJECTIVE:  Note: Objective measures were completed at Evaluation unless otherwise noted.  DIAGNOSTIC FINDINGS: CLINICAL DATA:  Neuro deficit, acute, stroke suspected; Neck trauma (Age >= 65y) Pt to ed from home via [facility] for weakness   EXAM: CT HEAD WITHOUT CONTRAST   CT CERVICAL SPINE WITHOUT CONTRAST   TECHNIQUE: Multidetector CT imaging of the head and cervical spine was performed following the standard protocol without intravenous contrast. Multiplanar CT image reconstructions of the cervical spine were also generated.   RADIATION DOSE REDUCTION: This exam was performed according to the departmental dose-optimization program which includes automated exposure control, adjustment of the mA and/or kV according to patient size and/or use of iterative reconstruction technique.   COMPARISON:  MRI head 06/06/2023   FINDINGS: CT HEAD FINDINGS   Brain:   Cerebral ventricle sizes are concordant with the degree of cerebral volume loss. Patchy and confluent areas of decreased attenuation are noted throughout the deep and periventricular white matter of the cerebral hemispheres bilaterally, compatible with chronic microvascular ischemic disease. 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 No evidence of large-territorial acute infarction. No parenchymal hemorrhage. No mass lesion. No extra-axial collection.   No mass effect or midline shift. No hydrocephalus. Basilar cisterns are patent.   Vascular: No hyperdense vessel. Atherosclerotic calcifications are present within the cavernous internal carotid arteries.   Skull: No acute fracture or focal lesion.   Sinuses/Orbits: Bilateral maxillary, sphenoid, ethmoid sinus mucosal thickening. Otherwise paranasal sinuses and mastoid air cells are clear. The orbits are unremarkable.   Other: None.   CT CERVICAL  SPINE FINDINGS   Alignment: Normal.   Skull base and vertebrae: Multilevel severe degenerative changes spine with associated multilevel severe osseous neural foraminal stenosis. No severe osseous central canal stenosis. No acute fracture. No aggressive appearing focal osseous lesion or focal pathologic process.   Soft tissues and spinal canal: No prevertebral fluid or swelling. No visible canal hematoma.   Upper chest: Unremarkable.   Other: None.   IMPRESSION: 1. No acute intracranial abnormality. 2. No acute displaced  fracture or traumatic listhesis of the cervical spine. 3. Multilevel severe degenerative changes spine with associated multilevel severe osseous neural foraminal stenosis.     Electronically Signed   By: Morgane  Naveau M.D.   On: 09/09/2023 21:42    COGNITION: Overall cognitive status: History of cognitive impairments - at baseline   SENSATION: WFL  COORDINATION: Impaired.   EDEMA:  None observed   POSTURE: rounded shoulders and forward head  LOWER EXTREMITY ROM:     Active  Right Eval Left Eval  Hip flexion    Hip extension    Hip abduction    Hip adduction    Hip internal rotation    Hip external rotation    Knee flexion    Knee extension    Ankle dorsiflexion    Ankle plantarflexion    Ankle inversion    Ankle eversion     (Blank rows = not tested)  LOWER EXTREMITY MMT:    MMT Right Eval Left Eval  Hip flexion 4 4  Hip extension 4 4  Hip abduction 4 4  Hip adduction 4 4  Hip internal rotation 4 4  Hip external rotation 4 4  Knee flexion 4 4  Knee extension 4 4  Ankle dorsiflexion 3+ 4  Ankle plantarflexion    Ankle inversion    Ankle eversion    (Blank rows = not tested)  BED MOBILITY:  Not tested  TRANSFERS: Sit to stand: CGA  Assistive device utilized: None     Stand to sit: CGA  Assistive device utilized: None     Chair to chair: CGA  Assistive device utilized: None         STAIRS: Not  tested GAIT: Findings: Gait Characteristics: decreased arm swing- Right, decreased arm swing- Left, decreased step length- Right, decreased step length- Left, decreased stance time- Right, decreased stance time- Left, and decreased stride length, Distance walked: > 100 feet, Assistive device utilized:None, and Level of assistance: CGA  FUNCTIONAL TESTS:  5 times sit to stand: 15.71 sec without UE support Timed up and go (TUG): 14.98 and 15.71 sec without UE support =15.3 sec avg 6 minute walk test: To be assessed 2nd visit 10 meter walk test: 0.7 m/s avg without AD Berg Balance Scale: 42/56  PATIENT SURVEYS:  ABC scale To be assessed next visit                                                                                                                              TREATMENT DATE:    Pt performed 5 time sit<>stand (5xSTS): 12.59 sec(12.99sec, 12.14 sec; >15 sec indicates increased fall risk)   PT instructed pt in TUG: 12.045sec  (12.51sec, 11.58sec) (average of 2 trials; >13.5 sec indicates increased fall risk)  Patient demonstrates increased fall risk as noted by score of   48/56 on Berg Balance Scale.  (<36= high risk for falls, close to 100%; 37-45 significant >80%; 46-51 moderate >50%;  52-55 lower >25%)  10 Meter Walk Test: Patient instructed to walk 10 meters (32.8 ft) as quickly and as safely as possible at their normal speed x2 and at a fast speed x2. Time measured from 2 meter mark to 8 meter mark to accommodate ramp-up and ramp-down.  Normal speed 1: 14.77 sec   Normal speed 2: 14.04 sec  Average Normal speed: 0.69 m/s  Cut off scores: <0.4 m/s = household Ambulator, 0.4-0.8 m/s = limited community Ambulator, >0.8 m/s = community Ambulator, >1.2 m/s = crossing a street, <1.0 = increased fall risk MCID 0.05 m/s (small), 0.13 m/s (moderate), 0.06 m/s (significant)  (ANPTA Core Set of Outcome Measures for Adults with Neurologic Conditions, 2018)  6 Min Walk Test:   Instructed patient to ambulate as quickly and as safely as possible for 6 minutes using LRAD. Patient was allowed to take standing rest breaks without stopping the test, but if the patient required a sitting rest break the clock would be stopped and the test would be over.  Results: 765 feet using no AD with supervision assist due to RLE foot drag that increased with fatigue.Aaron Aas Results indicate that the patient has reduced endurance with ambulation compared to age matched norms.  Age Matched Norms: 44-69 yo M: 49 F: 67, 18-79 yo M: 15 F: 471, 65-89 yo M: 417 F: 392 MDC: 58.21 meters (190.98 feet) or 50 meters (ANPTA Core Set of Outcome Measures for Adults with Neurologic Conditions, 2018)  PATIENT EDUCATION: Education details: PT plan of care; Purpose of PT as it relates to helping patient with strength, balance, HEP, Safety with mobility Person educated: Patient and Spouse Education method: Explanation, Demonstration, Tactile cues, Verbal cues, and Handouts Education comprehension: verbalized understanding and needs further education  HOME EXERCISE PROGRAM: Access Code: [email or web address] URL: [email or web address] Date: 12/28/2023 Prepared by: Aurora Lees  Exercises - Seated Toe Raise  - 3-4 x weekly - 3 sets - 10 reps - Seated Heel Raise  - 3-4 x weekly - 3 sets - 10 reps - Standing Romberg to 1/2 Tandem Stance  - 3-4 x weekly - 3 sets - 30 sec hold - Sit to Stand with Arms Crossed  - 1 x daily - 3-4 x weekly - 3 sets - 10 reps - Standing March with Counter Support  - 1 x daily - 3-4 x weekly - 3 sets - 10 reps  GOALS:  Goals reviewed with patient? Yes  SHORT TERM GOALS: Target date: 01/04/2024  Pt will be independent with HEP in order to improve strength and balance in order to decrease fall risk and improve function at home and work.  Baseline: EVAL- NO formal HEP in place Provided on 4/23.  5/27: provided on 4/23, expanded today.  Goal status: In progress    LONG  TERM GOALS: Target date: 02/15/2024   1.  Patient (> 54 years old) will complete five times sit to stand test in < 15 seconds indicating an increased LE strength and improved balance. Baseline: EVAL: 15.71 sec without UE support 5/27: 12.59 sec Goal status: MET  2.  Patient will improve ABC score by 9 points   to demonstrate statistically significant improvement in mobility and quality of life as it relates to their confidence in his balance.  Baseline: EVAL- To be assessed visit #2 4/28: 41% 5/27: 49% Goal status: in progress    3.  Patient will increase Berg Balance score by > 6 points to demonstrate decreased fall risk during functional activities. Baseline:  EVAL= 42/56 5/27: 48/56 Goal status: MET  4.   Patient will reduce timed up and go to <11 seconds to reduce fall risk and demonstrate improved transfer/gait ability. Baseline: 15.3 sec avg without AD 5/27: 12.045sec Goal status: In progress   5.   Patient will increase 10 meter walk test to >1.52m/s as to improve gait speed for better community ambulation and to reduce fall risk. Baseline: EVAL= 0.7 m/s avg without AD 5/27: 0.69 m/s no AD Goal status: in progress   6.   Patient will increase six minute walk test distance to >1000 for progression to community ambulator and improve gait ability Baseline: To be assessed visit #2 4/28:  309 ft stopped at 3 minutes 5/27: 765 feet using no AD  Goal status: In progress     ASSESSMENT:  CLINICAL IMPRESSION:   Patient instructed in progress note assessment to measure progress towards LTG. Pt has met 2 of 6 LTG with improved 5x STS and Berg balance scale. Improvement also made in self reported function through ABC, 6 min walk test, and TUG. Gait speed remains grossly unchanged and continues to have foot drag on the RLE due to "weak knee" per pt report. Educated on interpretation of standardized outcome measures as it pertains to functional level, fall risk and recommendation of DME  for safety in community.    Mr. Kessinger will benefit from skilled PT services to address these deficits in strength, mobility/balance and to improve his overall functional mobility and decrease risk for future falls. Patient's condition has the potential to improve in response to therapy. Maximum improvement is yet to be obtained. The anticipated improvement is attainable and reasonable in a generally predictable time.   OBJECTIVE IMPAIRMENTS: Abnormal gait, decreased activity tolerance, decreased balance, decreased cognition, decreased coordination, decreased endurance, decreased knowledge of condition, decreased knowledge of use of DME, decreased mobility, difficulty walking, decreased strength, and postural dysfunction.   ACTIVITY LIMITATIONS: carrying, lifting, bending, sitting, standing, squatting, stairs, and transfers  PARTICIPATION LIMITATIONS: meal prep, cleaning, laundry, driving, shopping, community activity, and yard work  PERSONAL FACTORS: 1-2 comorbidities: Mixed Alzheimers and vascular dementia, HTN are also affecting patient's functional outcome.   REHAB POTENTIAL: Good  CLINICAL DECISION MAKING: Evolving/moderate complexity  EVALUATION COMPLEXITY: Moderate  PLAN:  PT FREQUENCY: 1-2x/week  PT DURATION: 12 weeks  PLANNED INTERVENTIONS: 97164- PT Re-evaluation, 97750- Physical Performance Testing, 97110-Therapeutic exercises, 97530- Therapeutic activity, [id number]- Neuromuscular re-education, 97535- Self Care, 13086- Manual therapy, [id number]- Gait training, [phone]- Orthotic Initial, [phone]- Orthotic/Prosthetic subsequent, [phone]- Canalith repositioning, [phone]- Electrical stimulation (manual), Patient/Family education, Balance training, Stair training, Taping, Dry Needling, Joint mobilization, Joint manipulation, Spinal manipulation, Spinal mobilization, Vestibular training, DME instructions, Cryotherapy, and Moist heat  PLAN FOR NEXT SESSION:   Continue balance training and improved  foot clearance on the RLE to reduce fall risk.    Marina  Brain Cahill, PT, DPT Physical Therapist - [facility]  Outpatient Physical Therapy- Main Campus [phone]    10:14 AM 12/27/23

## 2024-01-02 ENCOUNTER — Ambulatory Visit: Attending: Primary Care | Admitting: Physical Therapy

## 2024-01-02 DIAGNOSIS — M6281 Muscle weakness (generalized): Secondary | ICD-10-CM | POA: Insufficient documentation

## 2024-01-02 DIAGNOSIS — R269 Unspecified abnormalities of gait and mobility: Secondary | ICD-10-CM | POA: Insufficient documentation

## 2024-01-02 DIAGNOSIS — R262 Difficulty in walking, not elsewhere classified: Secondary | ICD-10-CM | POA: Diagnosis not present

## 2024-01-02 DIAGNOSIS — R2689 Other abnormalities of gait and mobility: Secondary | ICD-10-CM | POA: Insufficient documentation

## 2024-01-02 DIAGNOSIS — R278 Other lack of coordination: Secondary | ICD-10-CM | POA: Diagnosis not present

## 2024-01-02 DIAGNOSIS — R2681 Unsteadiness on feet: Secondary | ICD-10-CM | POA: Insufficient documentation

## 2024-01-02 NOTE — Therapy (Signed)
 OUTPATIENT PHYSICAL THERAPY NEURO TREATMENT     Patient Name: Kurt Bailey MRN: [id number] DOB:[birth year], 78 y.o., male Today's Date: 01/02/2024   PCP: Dr, Tretha Fu REFERRING PROVIDER: Dr. Tretha Fu  END OF SESSION:   PT End of Session - 01/02/24 1011     Visit Number 11    Number of Visits 25    Date for PT Re-Evaluation 02/15/24    Progress Note Due on Visit 10    PT Start Time 1017    PT Stop Time 1057    PT Time Calculation (min) 40 min    Equipment Utilized During Treatment Gait belt    Activity Tolerance Patient tolerated treatment well    Behavior During Therapy WFL for tasks assessed/performed                 Past Medical History:  Diagnosis Date   Acute hypoxemic respiratory failure due to COVID-19 (HCC) 05/13/2020   Acute urinary retention 09/14/2023   Alcohol abuse    Diverticulitis    Periumbilical hernia    No past surgical history on file. 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) 09/19/2018    ONSET DATE: 09/09/2023  REFERRING DIAG:  Z86.16 (ICD-10-CM) - History of COVID-19  R26.81 (ICD-10-CM) - Unsteady gait    THERAPY DIAG:  Other abnormalities of gait and mobility  Difficulty in walking, not elsewhere classified  Abnormality of gait and mobility  Muscle weakness (generalized)  Unsteadiness on feet  Other lack of coordination  Rationale for Evaluation and Treatment: Rehabilitation  SUBJECTIVE:                                                                                                                                                                                              SUBJECTIVE STATEMENT:  Pt reports he is "just doing." Denies pain. Denies stumbles/falls since last session.    Pt accompanied by: significant other-LaTisha  PERTINENT HISTORY: Patient is a 78 year old male with past medical history significant for Vascular dementia with other behavorial disturbances- refrreral to PT for unsteady gait worsened since bout with COVID-19 on 09/09/2023- Per [facility] discharge note on 09/19/2023: Hospital Course: KWAME RYLAND  is 78 y.o. male with alcohol abuse, diverticulitis who presents to the ED with cough, chest congestion, dyspnea and myalgias.  Is also been having recurrent falls with generalized weakness per his daughter who brought him to the ER.  In the ED he was found to mildly tachycardic and hypertensive.  Labs mostly within normal limits.  Head CT is unremarkable, C-spine CT revealed severe degenerative changes with multilevel severe osseous neuroforaminal stenosis without acutely displaced fractures or traumatic listhesis.  Patient tested positive for COVID-19.  By reevaluation on 2/8 patient reported feeling weak but was overall all stable.  PAIN:  Are you having pain? No  PRECAUTIONS: Fall  RED FLAGS: None   WEIGHT BEARING RESTRICTIONS: No  FALLS: Has patient fallen in last 6 months? Yes. Number of falls 3  LIVING ENVIRONMENT: Lives with: lives with their family Lives in: House/apartment Stairs: Yes: External: 4 steps; can reach both Has following equipment at home: [name] - 2 wheeled and Grab bars  PLOF: Independent with community mobility with device, Needs assistance with ADLs, and Needs assistance with homemaking  PATIENT GOALS: Want to get around better.   OBJECTIVE:  Note: Objective measures were completed at Evaluation unless otherwise noted.  DIAGNOSTIC FINDINGS: CLINICAL DATA:  Neuro deficit,  acute, stroke suspected; Neck trauma (Age >= 65y) Pt to ed from home via [facility] for weakness   EXAM: CT HEAD WITHOUT CONTRAST   CT CERVICAL SPINE WITHOUT CONTRAST   TECHNIQUE: Multidetector CT imaging of the head and cervical spine was performed following the standard protocol without intravenous contrast. Multiplanar CT image reconstructions of the cervical spine were also generated.   RADIATION DOSE REDUCTION: This exam was performed according to the departmental dose-optimization program which includes automated exposure control, adjustment of the mA and/or kV according to patient size and/or use of iterative reconstruction technique.   COMPARISON:  MRI head 06/06/2023   FINDINGS: CT HEAD FINDINGS   Brain:   Cerebral ventricle sizes are concordant with the degree of cerebral volume loss. Patchy and confluent areas of decreased attenuation are noted throughout the deep and periventricular white matter of the cerebral hemispheres bilaterally, compatible with chronic microvascular ischemic disease. 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 No evidence of large-territorial acute infarction. No parenchymal hemorrhage. No mass lesion. No extra-axial collection.   No mass effect or midline shift. No hydrocephalus. Basilar cisterns are patent.   Vascular: No hyperdense vessel. Atherosclerotic calcifications are present within the cavernous internal carotid arteries.   Skull: No acute fracture or focal lesion.   Sinuses/Orbits: Bilateral maxillary, sphenoid, ethmoid sinus mucosal thickening. Otherwise paranasal sinuses and mastoid air cells are clear. The orbits are unremarkable.   Other: None.   CT CERVICAL SPINE FINDINGS   Alignment: Normal.   Skull base and vertebrae: Multilevel severe degenerative changes spine with associated multilevel severe osseous neural foraminal stenosis. No severe osseous central canal stenosis. No  acute fracture. No aggressive appearing focal osseous lesion or focal pathologic process.   Soft tissues and spinal canal: No prevertebral fluid or swelling. No visible canal hematoma.   Upper chest: Unremarkable.   Other: None.   IMPRESSION: 1. No acute intracranial abnormality. 2. No acute displaced fracture or traumatic listhesis of the cervical spine. 3. Multilevel severe degenerative changes spine with associated multilevel severe osseous neural foraminal stenosis.     Electronically Signed   By: Morgane  Naveau M.D.   On: 09/09/2023 21:42    COGNITION: Overall cognitive status: History of cognitive impairments -  at baseline   SENSATION: WFL  COORDINATION: Impaired.   EDEMA:  None observed   POSTURE: rounded shoulders and forward head  LOWER EXTREMITY ROM:     Active  Right Eval Left Eval  Hip flexion    Hip extension    Hip abduction    Hip adduction    Hip internal rotation    Hip external rotation    Knee flexion    Knee extension    Ankle dorsiflexion    Ankle plantarflexion    Ankle inversion    Ankle eversion     (Blank rows = not tested)  LOWER EXTREMITY MMT:    MMT Right Eval Left Eval  Hip flexion 4 4  Hip extension 4 4  Hip abduction 4 4  Hip adduction 4 4  Hip internal rotation 4 4  Hip external rotation 4 4  Knee flexion 4 4  Knee extension 4 4  Ankle dorsiflexion 3+ 4  Ankle plantarflexion    Ankle inversion    Ankle eversion    (Blank rows = not tested)  BED MOBILITY:  Not tested  TRANSFERS: Sit to stand: CGA  Assistive device utilized: None     Stand to sit: CGA  Assistive device utilized: None     Chair to chair: CGA  Assistive device utilized: None         STAIRS: Not tested GAIT: Findings: Gait Characteristics: decreased arm swing- Right, decreased arm swing- Left, decreased step length- Right, decreased step length- Left, decreased stance time- Right, decreased stance time- Left, and decreased stride  length, Distance walked: > 100 feet, Assistive device utilized:None, and Level of assistance: CGA  FUNCTIONAL TESTS:  5 times sit to stand: 15.71 sec without UE support Timed up and go (TUG): 14.98 and 15.71 sec without UE support =15.3 sec avg 6 minute walk test: To be assessed 2nd visit 10 meter walk test: 0.7 m/s avg without AD Berg Balance Scale: 42/56  PATIENT SURVEYS:  ABC scale To be assessed next visit                                                                                                                              TREATMENT DATE:   Unless otherwise stated, CGA was provided and gait belt donned in order to ensure pt safety throughout session.  Gait into therapy clinic, no AD, demonstrating slow gait speed, with R LE shuffling more than LLE and overall decreased step lengths bilaterally and slow gait speed.  Donned 4lb AW on R LE for remainder of session, unless otherwise stated, to promote increased neural recruitment.   Gait training ~163ft, no AD, focusing on increased R LE foot clearance with cuing to imagine stepping over something on ground.   Standing R LE NMR and strengthening including:  Repeated R foot taps to brown step 2x 10 reps Demos weakness in R hip flexors to lift R LE, but sufficient to clear step  height B UE support on balance bar and CGA for safety Stepping forward/backwards over 1/2 foam roll 2x 10 reps each LE Has fatigue with R LE during this exercise HR 77bpm, SpO2 97% CGA/light min A for balance and pt using L UE support on balance bar  Dynamic gait training including:  Forward reciprocal stepping over 4x 1/2 foam rolls down/back x5 reps Targeting increased R LE foot clearance and step length  CGA/light min A for safety/steadying without UE support   Gait training on treadmill ranging from 0.31mph - 0.103mph totaling 0.61mi  Pt with difficulty sustaining attention on the task in the distracting gym environment Continues to have shuffled  gait pattern bilaterally (R more than L) Removed AW from R LE half way through due to R knee discomfort and "stiffness" Able to provide 3 obstacles for patient to step RLE over to improve foot clearance and step length, but pt unable to sustain attention to continue with normal forward gait mechanics Pt reports gait on treadmill as fairly fatiguing  Stair navigation training ascending/descending 12 steps (6" height) using B HRs with light min assist - focus on reciprocal pattern on ascent wearing 4lb AW on R LE for NMR and strengthening and step-to pattern leading with R LE on descent for pt comfort.  Removed AW and performed additional 175ft of gait training with pt demonstrating increased R LE foot clearance and step length with increasing gait speed!  Pt states he feels good following today's session, but does report some fatigue during.    PATIENT EDUCATION: Education details: PT plan of care; Purpose of PT as it relates to helping patient with strength, balance, HEP, Safety with mobility Person educated: Patient and Spouse Education method: Explanation, Demonstration, Tactile cues, Verbal cues, and Handouts Education comprehension: verbalized understanding and needs further education  HOME EXERCISE PROGRAM: Access Code: [email or web address] URL: [email or web address] Date: 12/28/2023 Prepared by: Aurora Lees  Exercises - Seated Toe Raise  - 3-4 x weekly - 3 sets - 10 reps - Seated Heel Raise  - 3-4 x weekly - 3 sets - 10 reps - Standing Romberg to 1/2 Tandem Stance  - 3-4 x weekly - 3 sets - 30 sec hold - Sit to Stand with Arms Crossed  - 1 x daily - 3-4 x weekly - 3 sets - 10 reps - Standing March with Counter Support  - 1 x daily - 3-4 x weekly - 3 sets - 10 reps  GOALS:  Goals reviewed with patient? Yes  SHORT TERM GOALS: Target date: 01/04/2024  Pt will be independent with HEP in order to improve strength and balance in order to decrease fall risk and improve function  at home and work.  Baseline: EVAL- NO formal HEP in place Provided on 4/23.  5/27: provided on 4/23, expanded today.  Goal status: In progress    LONG TERM GOALS: Target date: 02/15/2024   1.  Patient (> 25 years old) will complete five times sit to stand test in < 15 seconds indicating an increased LE strength and improved balance. Baseline: EVAL: 15.71 sec without UE support 5/27: 12.59 sec Goal status: MET  2.  Patient will improve ABC score by 9 points   to demonstrate statistically significant improvement in mobility and quality of life as it relates to their confidence in his balance.  Baseline: EVAL- To be assessed visit #2 4/28: 41% 5/27: 49% Goal status: in progress    3.  Patient will increase Berg Balance score by > 6  points to demonstrate decreased fall risk during functional activities. Baseline: EVAL= 42/56 5/27: 48/56 Goal status: MET  4.   Patient will reduce timed up and go to <11 seconds to reduce fall risk and demonstrate improved transfer/gait ability. Baseline: 15.3 sec avg without AD 5/27: 12.045sec Goal status: In progress   5.   Patient will increase 10 meter walk test to >1.74m/s as to improve gait speed for better community ambulation and to reduce fall risk. Baseline: EVAL= 0.7 m/s avg without AD 5/27: 0.69 m/s no AD Goal status: in progress   6.   Patient will increase six minute walk test distance to >1000 for progression to community ambulator and improve gait ability Baseline: To be assessed visit #2 4/28:  309 ft stopped at 3 minutes 5/27: 765 feet using no AD  Goal status: In progress     ASSESSMENT:  CLINICAL IMPRESSION:  Therapy session focused on R LE NMR and strengthening interventions to improve R LE foot clearance and step length during gait. Patient demos weakness in R hip flexors and muscle fatigue with increased repetitions of the exercises. Therapist attempted gait training on treadmill with this being significantly challenging for  pt due to impaired attention as well as pt unable to sustain consistent gait speed and pt reporting significant fatigue with this despite short distance achieved. Following interventions today pt demos significant improvement in R LE foot clearance and step length during gait at end of session once AW removed. Mr. Pruden will benefit from continued skilled PT services to address these deficits in strength, mobility/balance and to improve his overall functional mobility and decrease risk for future falls.    OBJECTIVE IMPAIRMENTS: Abnormal gait, decreased activity tolerance, decreased balance, decreased cognition, decreased coordination, decreased endurance, decreased knowledge of condition, decreased knowledge of use of DME, decreased mobility, difficulty walking, decreased strength, and postural dysfunction.   ACTIVITY LIMITATIONS: carrying, lifting, bending, sitting, standing, squatting, stairs, and transfers  PARTICIPATION LIMITATIONS: meal prep, cleaning, laundry, driving, shopping, community activity, and yard work  PERSONAL FACTORS: 1-2 comorbidities: Mixed Alzheimers and vascular dementia, HTN are also affecting patient's functional outcome.   REHAB POTENTIAL: Good  CLINICAL DECISION MAKING: Evolving/moderate complexity  EVALUATION COMPLEXITY: Moderate  PLAN:  PT FREQUENCY: 1-2x/week  PT DURATION: 12 weeks  PLANNED INTERVENTIONS: 97164- PT Re-evaluation, 97750- Physical Performance Testing, 97110-Therapeutic exercises, 97530- Therapeutic activity, [id number]- Neuromuscular re-education, 97535- Self Care, 16109- Manual therapy, [id number]- Gait training, [phone]- Orthotic Initial, [phone]- Orthotic/Prosthetic subsequent, [phone]- Canalith repositioning, [phone]- Electrical stimulation (manual), Patient/Family education, Balance training, Stair training, Taping, Dry Needling, Joint mobilization, Joint manipulation, Spinal manipulation, Spinal mobilization, Vestibular training, DME instructions,  Cryotherapy, and Moist heat  PLAN FOR NEXT SESSION:  Continue balance training and improved foot clearance on the RLE to reduce fall risk Continue use of AW on R LE with high repetitions of stepping over obstacles to improve R foot clearance    Tryphena Perkovich, PT, DPT, NCS, CSRS Physical Therapist - [facility]  [facility]  10:59 AM 01/02/24

## 2024-01-04 ENCOUNTER — Ambulatory Visit: Admitting: Physical Therapy

## 2024-01-05 ENCOUNTER — Ambulatory Visit: Admitting: Physical Therapy

## 2024-01-05 DIAGNOSIS — R2689 Other abnormalities of gait and mobility: Secondary | ICD-10-CM | POA: Diagnosis not present

## 2024-01-05 DIAGNOSIS — R278 Other lack of coordination: Secondary | ICD-10-CM | POA: Diagnosis not present

## 2024-01-05 DIAGNOSIS — R262 Difficulty in walking, not elsewhere classified: Secondary | ICD-10-CM

## 2024-01-05 DIAGNOSIS — R2681 Unsteadiness on feet: Secondary | ICD-10-CM

## 2024-01-05 DIAGNOSIS — R269 Unspecified abnormalities of gait and mobility: Secondary | ICD-10-CM | POA: Diagnosis not present

## 2024-01-05 DIAGNOSIS — M6281 Muscle weakness (generalized): Secondary | ICD-10-CM

## 2024-01-05 NOTE — Therapy (Signed)
 OUTPATIENT PHYSICAL THERAPY NEURO TREATMENT     Patient Name: Kurt Bailey MRN: [id number] DOB:[birth year], 78 y.o., male Today's Date: 01/05/2024   PCP: Dr, Tretha Fu REFERRING PROVIDER: Dr. Tretha Fu  END OF SESSION:   PT End of Session - 01/05/24 1024     Visit Number 12    Number of Visits 25    Date for PT Re-Evaluation 02/15/24    Progress Note Due on Visit 10    PT Start Time 1021    PT Stop Time 1105    PT Time Calculation (min) 44 min    Equipment Utilized During Treatment Gait belt    Activity Tolerance Patient tolerated treatment well    Behavior During Therapy WFL for tasks assessed/performed                  Past Medical History:  Diagnosis Date   Acute hypoxemic respiratory failure due to COVID-19 (HCC) 05/13/2020   Acute urinary retention 09/14/2023   Alcohol abuse    Diverticulitis    Periumbilical hernia    No past surgical history on file. 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) 09/19/2018    ONSET DATE: 09/09/2023  REFERRING DIAG:  Z86.16 (ICD-10-CM) - History of COVID-19  R26.81 (ICD-10-CM) - Unsteady gait    THERAPY DIAG:  Other abnormalities of gait and mobility  Difficulty in walking, not elsewhere classified  Abnormality of gait and mobility  Muscle weakness (generalized)  Unsteadiness on feet  Other lack of coordination  Rationale for Evaluation and Treatment: Rehabilitation  SUBJECTIVE:                                                                                                                                                                                              SUBJECTIVE STATEMENT:  Pt reports he was a little tired after last therapy session. Reports he is doing good today. Denies pain to start session. Denies falls since last session, but wife said he "staggered" one time when standing up, but they are unable to remember the specific details of the event.   Pt accompanied by: significant other-LaTisha  PERTINENT HISTORY: Patient is a 79 year old male  with past medical history significant for Vascular dementia with other behavorial disturbances- refrreral to PT for unsteady gait worsened since bout with COVID-19 on 09/09/2023- Per [facility] discharge note on 09/19/2023: Hospital Course: Kurt Bailey is 78 y.o. male with alcohol abuse, diverticulitis who presents to the ED with cough, chest congestion, dyspnea and myalgias.  Is also been having recurrent falls with generalized weakness per his daughter who brought him to the ER.  In the ED he was found to mildly tachycardic and hypertensive.  Labs mostly within normal limits.  Head CT is unremarkable, C-spine CT revealed severe degenerative changes with multilevel severe osseous neuroforaminal stenosis without acutely displaced fractures or traumatic listhesis.  Patient tested positive for COVID-19.  By reevaluation on 2/8 patient reported feeling weak but was overall all stable.  PAIN:  Are you having pain? No  PRECAUTIONS: Fall  RED FLAGS: None   WEIGHT BEARING RESTRICTIONS: No  FALLS: Has patient fallen in last 6 months? Yes. Number of falls 3  LIVING ENVIRONMENT: Lives with: lives with their family Lives in: House/apartment Stairs: Yes: External: 4 steps; can reach both Has following equipment at home: [name] - 2 wheeled and Grab bars  PLOF: Independent with community mobility with device, Needs assistance with ADLs, and Needs assistance with  homemaking  PATIENT GOALS: Want to get around better.   OBJECTIVE:  Note: Objective measures were completed at Evaluation unless otherwise noted.  DIAGNOSTIC FINDINGS: CLINICAL DATA:  Neuro deficit, acute, stroke suspected; Neck trauma (Age >= 65y) Pt to ed from home via [facility] for weakness   EXAM: CT HEAD WITHOUT CONTRAST   CT CERVICAL SPINE WITHOUT CONTRAST   TECHNIQUE: Multidetector CT imaging of the head and cervical spine was performed following the standard protocol without intravenous contrast. Multiplanar CT image reconstructions of the cervical spine were also generated.   RADIATION DOSE REDUCTION: This exam was performed according to the departmental dose-optimization program which includes automated exposure control, adjustment of the mA and/or kV according to patient size and/or use of iterative reconstruction technique.   COMPARISON:  MRI head 06/06/2023   FINDINGS: CT HEAD FINDINGS   Brain:   Cerebral ventricle sizes are concordant with the degree of cerebral volume loss. Patchy and confluent areas of decreased attenuation are noted throughout the deep and periventricular white matter of the cerebral hemispheres bilaterally, compatible with chronic microvascular ischemic disease. 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 No evidence of large-territorial acute infarction. No parenchymal hemorrhage. No mass lesion. No extra-axial collection.   No mass effect or midline shift. No hydrocephalus. Basilar cisterns are patent.   Vascular: No hyperdense vessel. Atherosclerotic calcifications are present within the cavernous internal carotid arteries.   Skull: No acute fracture or focal lesion.   Sinuses/Orbits: Bilateral maxillary, sphenoid, ethmoid sinus mucosal thickening. Otherwise paranasal sinuses and mastoid air cells are clear. The orbits are unremarkable.   Other: None.   CT CERVICAL SPINE FINDINGS    Alignment: Normal.   Skull base and vertebrae: Multilevel severe degenerative changes spine with associated multilevel severe osseous neural foraminal stenosis. No severe osseous central canal stenosis. No acute fracture. No aggressive appearing focal osseous lesion or focal pathologic process.   Soft tissues and spinal canal: No prevertebral fluid or swelling. No visible canal hematoma.   Upper chest: Unremarkable.   Other: None.   IMPRESSION: 1. No acute intracranial abnormality. 2. No acute displaced fracture or traumatic listhesis of the cervical spine. 3. Multilevel severe degenerative changes spine with  associated multilevel severe osseous neural foraminal stenosis.     Electronically Signed   By: Morgane  Naveau M.D.   On: 09/09/2023 21:42    COGNITION: Overall cognitive status: History of cognitive impairments - at baseline   SENSATION: WFL  COORDINATION: Impaired.   EDEMA:  None observed   POSTURE: rounded shoulders and forward head  LOWER EXTREMITY ROM:     Active  Right Eval Left Eval  Hip flexion    Hip extension    Hip abduction    Hip adduction    Hip internal rotation    Hip external rotation    Knee flexion    Knee extension    Ankle dorsiflexion    Ankle plantarflexion    Ankle inversion    Ankle eversion     (Blank rows = not tested)  LOWER EXTREMITY MMT:    MMT Right Eval Left Eval  Hip flexion 4 4  Hip extension 4 4  Hip abduction 4 4  Hip adduction 4 4  Hip internal rotation 4 4  Hip external rotation 4 4  Knee flexion 4 4  Knee extension 4 4  Ankle dorsiflexion 3+ 4  Ankle plantarflexion    Ankle inversion    Ankle eversion    (Blank rows = not tested)  BED MOBILITY:  Not tested  TRANSFERS: Sit to stand: CGA  Assistive device utilized: None     Stand to sit: CGA  Assistive device utilized: None     Chair to chair: CGA  Assistive device utilized: None         STAIRS: Not tested GAIT: Findings: Gait  Characteristics: decreased arm swing- Right, decreased arm swing- Left, decreased step length- Right, decreased step length- Left, decreased stance time- Right, decreased stance time- Left, and decreased stride length, Distance walked: > 100 feet, Assistive device utilized:None, and Level of assistance: CGA  FUNCTIONAL TESTS:  5 times sit to stand: 15.71 sec without UE support Timed up and go (TUG): 14.98 and 15.71 sec without UE support =15.3 sec avg 6 minute walk test: To be assessed 2nd visit 10 meter walk test: 0.7 m/s avg without AD Berg Balance Scale: 42/56  PATIENT SURVEYS:  ABC scale To be assessed next visit                                                                                                                              TREATMENT DATE:   Unless otherwise stated, CGA was provided and gait belt donned in order to ensure pt safety throughout session.  Gait into therapy clinic, no AD, demonstrating slow gait speed, with R LE shuffling more than LLE and overall decreased step lengths and foot clearances bilaterally.  Donned 4lb AW on R LE for remainder of session, unless otherwise stated, to promote increased neural recruitment.   Gait training ~143ft, no AD, focusing on increased R LE foot clearance with cuing to imagine stepping over something on  ground and having a quiet foot landing due to pt skidding his heel on the ground today, which is an improvement compared to pt usually landing with his toe on the ground.   Standing R LE NMR and strengthening, performed at balance bar for safety and UE support as needed, including:  Repeated R foot taps to green step with 2x purple plate - 2x 15 reps Demos weakness in R hip flexors to lift R LE, but sufficient to clear this advanced step height! B UE support on balance bar and CGA for safety R LE stepping forward/backwards over 1/2 foam roll increased to hurdle x15 reps each Has fatigue with R LE during this exercise HR 66bpm,  SpO2 98% CGA for balance safety and pt NOT using UE support on balance bar today, except intermittently when stepping over hurdle  Dynamic gait training, near balance bar for safety, including:  Forward reciprocal stepping over 4x 1/2 foam rolls down/back x10 reps Targeting increased R LE foot clearance and step length  CGA/light min A for safety/steadying without UE support HR 69bpm   Stair navigation training ascending/descending 12 steps (6" height) using B HRs with light min assist/CGA - reciprocal pattern on ascent wearing 4lb AW on R LE for NMR and strengthening as well as progressed from step-to leading with R LE to reciprocal pattern on descent Pt denies any pain with this  Pt and wife inquiring about therapy POC. Pt's wife reports they occasionally walk at home. Pt reports he feels like we have helped him achieve his goals. Pt and wife report feeling that therapy has helped him with them noticing an improvement in his walking and balance.   Patient requesting to discuss next visit potentially being his last therapy appointment.  PATIENT EDUCATION: Education details: PT plan of care; Purpose of PT as it relates to helping patient with strength, balance, HEP, Safety with mobility Person educated: Patient and Spouse Education method: Explanation, Demonstration, Tactile cues, Verbal cues, and Handouts Education comprehension: verbalized understanding and needs further education  HOME EXERCISE PROGRAM: Access Code: [email or web address] URL: [email or web address] Date: 12/28/2023 Prepared by: Aurora Lees  Exercises - Seated Toe Raise  - 3-4 x weekly - 3 sets - 10 reps - Seated Heel Raise  - 3-4 x weekly - 3 sets - 10 reps - Standing Romberg to 1/2 Tandem Stance  - 3-4 x weekly - 3 sets - 30 sec hold - Sit to Stand with Arms Crossed  - 1 x daily - 3-4 x weekly - 3 sets - 10 reps - Standing March with Counter Support  - 1 x daily - 3-4 x weekly - 3 sets - 10  reps  GOALS:  Goals reviewed with patient? Yes  SHORT TERM GOALS: Target date: 01/04/2024  Pt will be independent with HEP in order to improve strength and balance in order to decrease fall risk and improve function at home and work.  Baseline: EVAL- NO formal HEP in place Provided on 4/23.  5/27: provided on 4/23, expanded today.  Goal status: In progress    LONG TERM GOALS: Target date: 02/15/2024   1.  Patient (> 80 years old) will complete five times sit to stand test in < 15 seconds indicating an increased LE strength and improved balance. Baseline: EVAL: 15.71 sec without UE support 5/27: 12.59 sec Goal status: MET  2.  Patient will improve ABC score by 9 points   to demonstrate statistically significant improvement in mobility and quality of life as it  relates to their confidence in his balance.  Baseline: EVAL- To be assessed visit #2 4/28: 41% 5/27: 49% Goal status: in progress    3.  Patient will increase Berg Balance score by > 6 points to demonstrate decreased fall risk during functional activities. Baseline: EVAL= 42/56 5/27: 48/56 Goal status: MET  4.   Patient will reduce timed up and go to <11 seconds to reduce fall risk and demonstrate improved transfer/gait ability. Baseline: 15.3 sec avg without AD 5/27: 12.045sec Goal status: In progress   5.   Patient will increase 10 meter walk test to >1.74m/s as to improve gait speed for better community ambulation and to reduce fall risk. Baseline: EVAL= 0.7 m/s avg without AD 5/27: 0.69 m/s no AD Goal status: in progress   6.   Patient will increase six minute walk test distance to >1000 for progression to community ambulator and improve gait ability Baseline: To be assessed visit #2 4/28:  309 ft stopped at 3 minutes 5/27: 765 feet using no AD  Goal status: In progress     ASSESSMENT:  CLINICAL IMPRESSION:  Therapy session continued to focus on R LE NMR and strengthening interventions to improve R LE foot  clearance and step length during gait. Patient continues to demo weakness in R hip flexors and muscle fatigue with increased repetitions of the exercises; however, pt able to progress to taller obstacles and increased repetitions for all exercises today! Following interventions today pt continues to demo significant improvement in R LE foot clearance and step length during gait at end of session once AW removed. Patient reports he feels therapy has helped improve his walking and balance and that he has reached his goals. Patient would like to discuss the next visit being his discharge. Therapist educated pt on which LTGs he has met and which ones are still in progress to provide increased insight to his deficits. Mr. Sires will benefit from continued skilled PT services to address these deficits in strength, mobility/balance and to improve his overall functional mobility and decrease risk for future falls.    OBJECTIVE IMPAIRMENTS: Abnormal gait, decreased activity tolerance, decreased balance, decreased cognition, decreased coordination, decreased endurance, decreased knowledge of condition, decreased knowledge of use of DME, decreased mobility, difficulty walking, decreased strength, and postural dysfunction.   ACTIVITY LIMITATIONS: carrying, lifting, bending, sitting, standing, squatting, stairs, and transfers  PARTICIPATION LIMITATIONS: meal prep, cleaning, laundry, driving, shopping, community activity, and yard work  PERSONAL FACTORS: 1-2 comorbidities: Mixed Alzheimers and vascular dementia, HTN are also affecting patient's functional outcome.   REHAB POTENTIAL: Good  CLINICAL DECISION MAKING: Evolving/moderate complexity  EVALUATION COMPLEXITY: Moderate  PLAN:  PT FREQUENCY: 1-2x/week  PT DURATION: 12 weeks  PLANNED INTERVENTIONS: 97164- PT Re-evaluation, 97750- Physical Performance Testing, 97110-Therapeutic exercises, 97530- Therapeutic activity, [id number]- Neuromuscular re-education,  97535- Self Care, 16109- Manual therapy, [id number]- Gait training, [phone]- Orthotic Initial, [phone]- Orthotic/Prosthetic subsequent, [phone]- Canalith repositioning, [phone]- Electrical stimulation (manual), Patient/Family education, Balance training, Stair training, Taping, Dry Needling, Joint mobilization, Joint manipulation, Spinal manipulation, Spinal mobilization, Vestibular training, DME instructions, Cryotherapy, and Moist heat  PLAN FOR NEXT SESSION:   *re-test and possible discharge as pt reports feeling we have helped him reach his goals* Continue balance training and improved foot clearance on the RLE to reduce fall risk Continue use of AW on R LE with high repetitions of stepping over obstacles to improve R foot clearance    Marni Franzoni, PT, DPT, NCS, CSRS Physical Therapist - [facility]  [facility]  11:08 AM 01/05/24

## 2024-01-05 NOTE — Therapy (Signed)
 OUTPATIENT PHYSICAL THERAPY NEURO TREATMENT/ DISCHARGE      Patient Name: Kurt Bailey MRN: [id number] DOB:Dec 26, 1945, 78 y.o., male Today's Date: 01/09/2024   PCP: Dr, Tretha Fu REFERRING PROVIDER: Dr. Tretha Fu  END OF SESSION:   PT End of Session - 01/09/24 1005     Visit Number 13    Number of Visits 25    Date for PT Re-Evaluation 02/15/24    Progress Note Due on Visit 10    PT Start Time 1015    PT Stop Time 1059    PT Time Calculation (min) 44 min    Equipment Utilized During Treatment Gait belt    Activity Tolerance Patient tolerated treatment well    Behavior During Therapy WFL for tasks assessed/performed                  Past Medical History:  Diagnosis Date   Acute hypoxemic respiratory failure due to COVID-19 (HCC) 05/13/2020   Acute urinary retention 09/14/2023   Alcohol abuse    Diverticulitis    Periumbilical hernia    History reviewed. No pertinent surgical history. 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) 09/19/2018    ONSET DATE: 09/09/2023  REFERRING DIAG:  Z86.16 (ICD-10-CM) - History of COVID-19  R26.81 (ICD-10-CM) - Unsteady gait    THERAPY DIAG:  Other abnormalities of gait and mobility  Difficulty in walking, not elsewhere classified  Abnormality of gait and mobility  Muscle weakness (generalized)  Rationale for Evaluation and Treatment: Rehabilitation  SUBJECTIVE:                                                                                                                                                                                              SUBJECTIVE STATEMENT:  Patient wants to discharge from therapy. Hasn't been doing his homework.   Pt accompanied by: significant other-LaTisha  PERTINENT HISTORY: Patient is a 78 year old male with past medical history significant for Vascular dementia with other behavorial disturbances- refrreral to PT for unsteady gait worsened since bout with COVID-19 on 09/09/2023- Per [facility] discharge note on 09/19/2023: Hospital Course: ED MANDICH is 78 y.o. male with alcohol abuse, diverticulitis who  presents to the ED with cough, chest congestion, dyspnea and myalgias.  Is also been having recurrent falls with generalized weakness per his daughter who brought him to the ER.  In the ED he was found to mildly tachycardic and hypertensive.  Labs mostly within normal limits.  Head CT is unremarkable, C-spine CT revealed severe degenerative changes with multilevel severe osseous neuroforaminal stenosis without acutely displaced fractures or traumatic listhesis.  Patient tested positive for COVID-19.  By reevaluation on 2/8 patient reported feeling weak but was overall all stable.  PAIN:  Are you having pain? No  PRECAUTIONS: Fall  RED FLAGS: None   WEIGHT BEARING RESTRICTIONS: No  FALLS: Has patient fallen in last 6 months? Yes. Number of falls 3  LIVING ENVIRONMENT: Lives with: lives with their family Lives in: House/apartment Stairs: Yes: External: 4 steps; can reach both Has following equipment at home: [name] - 2 wheeled and Grab bars  PLOF: Independent with community mobility with device, Needs assistance with ADLs, and Needs assistance with homemaking  PATIENT GOALS: Want to get around better.   OBJECTIVE:  Note: Objective measures were completed at Evaluation unless otherwise noted.  DIAGNOSTIC FINDINGS: CLINICAL DATA:  Neuro deficit, acute, stroke suspected; Neck  trauma (Age >= 65y) Pt to ed from home via [facility] for weakness   EXAM: CT HEAD WITHOUT CONTRAST   CT CERVICAL SPINE WITHOUT CONTRAST   TECHNIQUE: Multidetector CT imaging of the head and cervical spine was performed following the standard protocol without intravenous contrast. Multiplanar CT image reconstructions of the cervical spine were also generated.   RADIATION DOSE REDUCTION: This exam was performed according to the departmental dose-optimization program which includes automated exposure control, adjustment of the mA and/or kV according to patient size and/or use of iterative reconstruction technique.   COMPARISON:  MRI head 06/06/2023   FINDINGS: CT HEAD FINDINGS   Brain:   Cerebral ventricle sizes are concordant with the degree of cerebral volume loss. Patchy and confluent areas of decreased attenuation are noted throughout the deep and periventricular white matter of the cerebral hemispheres bilaterally, compatible with chronic microvascular ischemic disease. 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 No evidence of large-territorial acute infarction. No parenchymal hemorrhage. No mass lesion. No extra-axial collection.   No mass effect or midline shift. No hydrocephalus. Basilar cisterns are patent.   Vascular: No hyperdense vessel. Atherosclerotic calcifications are present within the cavernous internal carotid arteries.   Skull: No acute fracture or focal lesion.   Sinuses/Orbits: Bilateral maxillary, sphenoid, ethmoid sinus mucosal thickening. Otherwise paranasal sinuses and mastoid air cells are clear. The orbits are unremarkable.   Other: None.   CT CERVICAL SPINE FINDINGS   Alignment: Normal.   Skull base and vertebrae: Multilevel severe degenerative changes spine with associated multilevel severe osseous neural foraminal stenosis. No severe osseous central canal stenosis. No acute fracture. No aggressive  appearing focal osseous lesion or focal pathologic process.   Soft tissues and spinal canal: No prevertebral fluid or swelling. No visible canal hematoma.   Upper chest: Unremarkable.   Other: None.   IMPRESSION: 1. No acute intracranial abnormality. 2. No acute displaced fracture or traumatic listhesis of the cervical spine. 3. Multilevel severe degenerative changes spine with associated multilevel severe osseous neural foraminal stenosis.     Electronically Signed   By: Morgane  Naveau M.D.   On: 09/09/2023 21:42    COGNITION: Overall cognitive status: History of cognitive impairments - at baseline   SENSATION: WFL  COORDINATION:  Impaired.   EDEMA:  None observed   POSTURE: rounded shoulders and forward head  LOWER EXTREMITY ROM:     Active  Right Eval Left Eval  Hip flexion    Hip extension    Hip abduction    Hip adduction    Hip internal rotation    Hip external rotation    Knee flexion    Knee extension    Ankle dorsiflexion    Ankle plantarflexion    Ankle inversion    Ankle eversion     (Blank rows = not tested)  LOWER EXTREMITY MMT:    MMT Right Eval Left Eval  Hip flexion 4 4  Hip extension 4 4  Hip abduction 4 4  Hip adduction 4 4  Hip internal rotation 4 4  Hip external rotation 4 4  Knee flexion 4 4  Knee extension 4 4  Ankle dorsiflexion 3+ 4  Ankle plantarflexion    Ankle inversion    Ankle eversion    (Blank rows = not tested)  BED MOBILITY:  Not tested  TRANSFERS: Sit to stand: CGA  Assistive device utilized: None     Stand to sit: CGA  Assistive device utilized: None     Chair to chair: CGA  Assistive device utilized: None         STAIRS: Not tested GAIT: Findings: Gait Characteristics: decreased arm swing- Right, decreased arm swing- Left, decreased step length- Right, decreased step length- Left, decreased stance time- Right, decreased stance time- Left, and decreased stride length, Distance walked: > 100  feet, Assistive device utilized:None, and Level of assistance: CGA  FUNCTIONAL TESTS:  5 times sit to stand: 15.71 sec without UE support Timed up and go (TUG): 14.98 and 15.71 sec without UE support =15.3 sec avg 6 minute walk test: To be assessed 2nd visit 10 meter walk test: 0.7 m/s avg without AD Berg Balance Scale: 42/56  PATIENT SURVEYS:  ABC scale To be assessed next visit                                                                                                                              TREATMENT DATE:  Discharge: review HEP: Access Code: [email or web address] URL: [email or web address] Date: 12/28/2023 Prepared by: Aurora Lees  Exercises - Seated Toe Raise  - 3-4 x weekly - 3 sets - 10 reps - Seated Heel Raise  - 3-4 x weekly - 3 sets - 10 reps - Standing Romberg to 1/2 Tandem Stance  - 3-4 x weekly - 3 sets - 30 sec hold - Sit to Stand with Arms Crossed  - 1 x daily - 3-4 x weekly - 3 sets - 10 reps - Standing March with Counter Support  - 1 x daily - 3-4 x weekly - 3 sets - 10 reps  Education on need to perform.   TherAct:  6" step: -step up/down 2*10 each LE; no UE support  Activity Description: 6  pods on stairs; forward/backwards walk up steps  Activity Setting:  The [employer] Random setting was chosen to enhance cognitive processing and agility, providing an unpredictable environment to simulate real-world scenarios, and fostering quick reactions and adaptability.   Number of Pods:  5 Cycles/Sets:  3 Duration (Time or Hit Count):  10   Ambulate 160 ft with CGA with 3lb AW; max cueing for foot clearance     TherEx:  3lb ankle weights: 2 sets  -LAQ 2*12 each LE -hip flexion 10x; each LE; 2 sets  -step over theraband 10x each LE ;2 sets       PATIENT EDUCATION: Education details: PT plan of care; Purpose of PT as it relates to helping patient with strength, balance, HEP, Safety with mobility Person educated: Patient and Spouse Education  method: Explanation, Demonstration, Tactile cues, Verbal cues, and Handouts Education comprehension: verbalized understanding and needs further education  HOME EXERCISE PROGRAM: Access Code: [email or web address] URL: [email or web address] Date: 12/28/2023 Prepared by: Aurora Lees  Exercises - Seated Toe Raise  - 3-4 x weekly - 3 sets - 10 reps - Seated Heel Raise  - 3-4 x weekly - 3 sets - 10 reps - Standing Romberg to 1/2 Tandem Stance  - 3-4 x weekly - 3 sets - 30 sec hold - Sit to Stand with Arms Crossed  - 1 x daily - 3-4 x weekly - 3 sets - 10 reps - Standing March with Counter Support  - 1 x daily - 3-4 x weekly - 3 sets - 10 reps  GOALS:  Goals reviewed with patient? Yes  SHORT TERM GOALS: Target date: 01/04/2024  Pt will be independent with HEP in order to improve strength and balance in order to decrease fall risk and improve function at home and work.  Baseline: EVAL- NO formal HEP in place Provided on 4/23.  5/27: provided on 4/23, expanded today.  Goal status: In progress    LONG TERM GOALS: Target date: 02/15/2024   1.  Patient (> 8 years old) will complete five times sit to stand test in < 15 seconds indicating an increased LE strength and improved balance. Baseline: EVAL: 15.71 sec without UE support 5/27: 12.59 sec Goal status: MET  2.  Patient will improve ABC score by 9 points   to demonstrate statistically significant improvement in mobility and quality of life as it relates to their confidence in his balance.  Baseline: EVAL- To be assessed visit #2 4/28: 41% 5/27: 49% Goal status: in progress    3.  Patient will increase Berg Balance score by > 6 points to demonstrate decreased fall risk during functional activities. Baseline: EVAL= 42/56 5/27: 48/56 Goal status: MET  4.   Patient will reduce timed up and go to <11 seconds to reduce fall risk and demonstrate improved transfer/gait ability. Baseline: 15.3 sec avg without AD 5/27: 12.045sec Goal  status: In progress   5.   Patient will increase 10 meter walk test to >1.9m/s as to improve gait speed for better community ambulation and to reduce fall risk. Baseline: EVAL= 0.7 m/s avg without AD 5/27: 0.69 m/s no AD Goal status: in progress   6.   Patient will increase six minute walk test distance to >1000 for progression to community ambulator and improve gait ability Baseline: To be assessed visit #2 4/28:  309 ft stopped at 3 minutes 5/27: 765 feet using no AD  Goal status: In progress     ASSESSMENT:  CLINICAL IMPRESSION:  Patient discharging due to patient request and non compliance with home program. Patient wants to discharge at this time. Review of HEP and importance of movement with discharge performed.    OBJECTIVE IMPAIRMENTS: Abnormal gait, decreased activity tolerance, decreased balance, decreased cognition, decreased coordination, decreased endurance, decreased knowledge of condition, decreased knowledge of use of DME, decreased mobility, difficulty walking, decreased strength, and postural dysfunction.   ACTIVITY LIMITATIONS: carrying, lifting, bending, sitting, standing, squatting, stairs, and transfers  PARTICIPATION LIMITATIONS: meal prep, cleaning, laundry, driving, shopping, community activity, and yard work  PERSONAL FACTORS: 1-2 comorbidities: Mixed Alzheimers and vascular dementia, HTN are also affecting patient's functional outcome.   REHAB POTENTIAL: Good  CLINICAL DECISION MAKING: Evolving/moderate complexity  EVALUATION COMPLEXITY: Moderate  PLAN:  PT FREQUENCY: 1-2x/week  PT DURATION: 12 weeks  PLANNED INTERVENTIONS: 97164- PT Re-evaluation, 97750- Physical Performance Testing, 97110-Therapeutic exercises, 97530- Therapeutic activity, 97112- Neuromuscular re-education, 97535- Self Care, 16109- Manual therapy, [phone]- Gait training, [phone]- Orthotic Initial, [phone]- Orthotic/Prosthetic subsequent, [phone]- Canalith repositioning, [phone]- Electrical  stimulation (manual), Patient/Family education, Balance training, Stair training, Taping, Dry Needling, Joint mobilization, Joint manipulation, Spinal manipulation, Spinal mobilization, Vestibular training, DME instructions, Cryotherapy, and Moist heat  PLAN FOR NEXT SESSION:     Mariesha Venturella  Brain Cahill, PT, DPT Physical Therapist - [facility]  Outpatient Physical Therapy- Main Campus [phone]    11:03 AM 01/09/24

## 2024-01-09 ENCOUNTER — Ambulatory Visit

## 2024-01-09 DIAGNOSIS — M6281 Muscle weakness (generalized): Secondary | ICD-10-CM | POA: Diagnosis not present

## 2024-01-09 DIAGNOSIS — R2681 Unsteadiness on feet: Secondary | ICD-10-CM | POA: Diagnosis not present

## 2024-01-09 DIAGNOSIS — R278 Other lack of coordination: Secondary | ICD-10-CM | POA: Diagnosis not present

## 2024-01-09 DIAGNOSIS — R2689 Other abnormalities of gait and mobility: Secondary | ICD-10-CM | POA: Diagnosis not present

## 2024-01-09 DIAGNOSIS — R269 Unspecified abnormalities of gait and mobility: Secondary | ICD-10-CM

## 2024-01-09 DIAGNOSIS — R262 Difficulty in walking, not elsewhere classified: Secondary | ICD-10-CM

## 2024-01-16 ENCOUNTER — Ambulatory Visit

## 2024-01-18 ENCOUNTER — Ambulatory Visit

## 2024-01-23 ENCOUNTER — Ambulatory Visit: Admitting: Physical Therapy

## 2024-01-25 ENCOUNTER — Ambulatory Visit: Admitting: Physical Therapy

## 2024-01-30 ENCOUNTER — Ambulatory Visit: Admitting: Physical Therapy

## 2024-02-01 ENCOUNTER — Ambulatory Visit: Admitting: Physical Therapy

## 2024-02-06 ENCOUNTER — Ambulatory Visit: Admitting: Physical Therapy

## 2024-02-08 ENCOUNTER — Ambulatory Visit: Admitting: Physical Therapy

## 2024-02-13 ENCOUNTER — Ambulatory Visit

## 2024-02-15 ENCOUNTER — Ambulatory Visit: Admitting: Physical Therapy

## 2024-02-16 ENCOUNTER — Other Ambulatory Visit: Payer: Self-pay | Admitting: Primary Care

## 2024-02-16 DIAGNOSIS — B351 Tinea unguium: Secondary | ICD-10-CM | POA: Diagnosis not present

## 2024-02-16 DIAGNOSIS — M79674 Pain in right toe(s): Secondary | ICD-10-CM | POA: Diagnosis not present

## 2024-02-16 DIAGNOSIS — E785 Hyperlipidemia, unspecified: Secondary | ICD-10-CM

## 2024-02-16 DIAGNOSIS — M79675 Pain in left toe(s): Secondary | ICD-10-CM | POA: Diagnosis not present

## 2024-02-20 ENCOUNTER — Ambulatory Visit: Admitting: Physical Therapy

## 2024-02-22 ENCOUNTER — Ambulatory Visit: Admitting: Physical Therapy

## 2024-02-27 ENCOUNTER — Ambulatory Visit: Admitting: Physical Therapy

## 2024-02-29 ENCOUNTER — Ambulatory Visit: Admitting: Physical Therapy

## 2024-03-05 ENCOUNTER — Ambulatory Visit: Admitting: Physical Therapy

## 2024-03-07 ENCOUNTER — Ambulatory Visit: Admitting: Physical Therapy

## 2024-03-12 ENCOUNTER — Ambulatory Visit: Admitting: Physical Therapy

## 2024-03-14 ENCOUNTER — Ambulatory Visit: Admitting: Physical Therapy

## 2024-03-19 ENCOUNTER — Ambulatory Visit: Admitting: Physical Therapy

## 2024-03-21 ENCOUNTER — Ambulatory Visit: Admitting: Physical Therapy

## 2024-03-29 DIAGNOSIS — F02818 Dementia in other diseases classified elsewhere, unspecified severity, with other behavioral disturbance: Secondary | ICD-10-CM | POA: Diagnosis not present

## 2024-03-29 DIAGNOSIS — R251 Tremor, unspecified: Secondary | ICD-10-CM | POA: Diagnosis not present

## 2024-03-29 DIAGNOSIS — I639 Cerebral infarction, unspecified: Secondary | ICD-10-CM | POA: Diagnosis not present

## 2024-03-29 DIAGNOSIS — F01518 Vascular dementia, unspecified severity, with other behavioral disturbance: Secondary | ICD-10-CM | POA: Diagnosis not present

## 2024-03-29 DIAGNOSIS — Z1331 Encounter for screening for depression: Secondary | ICD-10-CM | POA: Diagnosis not present

## 2024-03-29 DIAGNOSIS — G309 Alzheimer's disease, unspecified: Secondary | ICD-10-CM | POA: Diagnosis not present

## 2024-03-29 DIAGNOSIS — M542 Cervicalgia: Secondary | ICD-10-CM | POA: Diagnosis not present

## 2024-03-29 DIAGNOSIS — M797 Fibromyalgia: Secondary | ICD-10-CM | POA: Diagnosis not present

## 2024-03-29 DIAGNOSIS — R2689 Other abnormalities of gait and mobility: Secondary | ICD-10-CM | POA: Diagnosis not present

## 2024-04-04 ENCOUNTER — Telehealth: Payer: Self-pay

## 2024-04-04 DIAGNOSIS — Z23 Encounter for immunization: Secondary | ICD-10-CM

## 2024-04-04 NOTE — Telephone Encounter (Signed)
 You typically do not need an order for the Covid-19 vaccine. Can we call [facility] to find out why? Is this for insurance coverage?

## 2024-04-04 NOTE — Telephone Encounter (Signed)
 Copied from CRM #[id number]. Topic: General - Other >> Apr 04, 2024  2:15 PM Berneda FALCON wrote: Reason for CRM: Pt wife Dyane is calling to let PCP know that Kurt Bailey is requiring an order from us  for the COVID shot.  Please give her a callback with any updates on this for this patient. Callback is [phone]

## 2024-04-05 MED ORDER — COVID-19 MRNA VACC (MODERNA) 50 MCG/0.5ML IM SUSP: 0.5000 mL | Freq: Once | INTRAMUSCULAR | 0 refills | Status: AC

## 2024-04-05 NOTE — Telephone Encounter (Signed)
 Prescription was sent incorrectly first. Resent to pharmacy.

## 2024-04-05 NOTE — Addendum Note (Signed)
 Addended by: NARCISO ANDREZ BROCKS on: 04/05/2024 03:22 PM   Modules accepted: Orders

## 2024-04-05 NOTE — Telephone Encounter (Signed)
 Noted, Rx sent to pharmacy.

## 2024-04-05 NOTE — Telephone Encounter (Signed)
 Called and spoke with [facility] pharmacy they stated as of 04/04/24 [place]  now requires prescriptions to be sent to pharmacy in order for them to give Covid vaccines. This is supposedly all across [place] pharmacies  They have Spikevax or comirnaty in stock and can send script to have them administer to patient.

## 2024-04-05 NOTE — Addendum Note (Signed)
 Addended by: Yuchen Fedor K on: 04/05/2024 10:26 AM   Modules accepted: Orders

## 2024-04-05 NOTE — Telephone Encounter (Signed)
 Kurt Bailey called back. I relayed msg that rx was sent. Please advise.

## 2024-04-06 DIAGNOSIS — Z23 Encounter for immunization: Secondary | ICD-10-CM | POA: Diagnosis not present

## 2024-04-25 ENCOUNTER — Other Ambulatory Visit: Payer: Self-pay | Admitting: Primary Care

## 2024-04-25 DIAGNOSIS — Z23 Encounter for immunization: Secondary | ICD-10-CM

## 2024-06-16 ENCOUNTER — Emergency Department

## 2024-06-16 ENCOUNTER — Other Ambulatory Visit: Payer: Self-pay

## 2024-06-16 ENCOUNTER — Inpatient Hospital Stay
Admission: EM | Admit: 2024-06-16 | Discharge: 2024-06-19 | DRG: 177 | Disposition: A | Discharge status: Home / Self Care | Attending: Emergency Medicine | Admitting: Emergency Medicine

## 2024-06-16 DIAGNOSIS — R410 Disorientation, unspecified: Secondary | ICD-10-CM | POA: Insufficient documentation

## 2024-06-16 DIAGNOSIS — K5730 Diverticulosis of large intestine without perforation or abscess without bleeding: Secondary | ICD-10-CM | POA: Diagnosis not present

## 2024-06-16 DIAGNOSIS — R569 Unspecified convulsions: Secondary | ICD-10-CM | POA: Diagnosis present

## 2024-06-16 DIAGNOSIS — Z8249 Family history of ischemic heart disease and other diseases of the circulatory system: Secondary | ICD-10-CM

## 2024-06-16 DIAGNOSIS — I7090 Unspecified atherosclerosis: Secondary | ICD-10-CM | POA: Diagnosis present

## 2024-06-16 DIAGNOSIS — Z83438 Family history of other disorder of lipoprotein metabolism and other lipidemia: Secondary | ICD-10-CM

## 2024-06-16 DIAGNOSIS — F01518 Vascular dementia, unspecified severity, with other behavioral disturbance: Secondary | ICD-10-CM | POA: Diagnosis not present

## 2024-06-16 DIAGNOSIS — J9601 Acute respiratory failure with hypoxia: Secondary | ICD-10-CM | POA: Diagnosis present

## 2024-06-16 DIAGNOSIS — I6523 Occlusion and stenosis of bilateral carotid arteries: Secondary | ICD-10-CM | POA: Diagnosis not present

## 2024-06-16 DIAGNOSIS — E66811 Obesity, class 1: Secondary | ICD-10-CM | POA: Diagnosis present

## 2024-06-16 DIAGNOSIS — Z8616 Personal history of COVID-19: Secondary | ICD-10-CM | POA: Diagnosis not present

## 2024-06-16 DIAGNOSIS — M797 Fibromyalgia: Secondary | ICD-10-CM | POA: Diagnosis present

## 2024-06-16 DIAGNOSIS — G20A1 Parkinson's disease without dyskinesia, without mention of fluctuations: Secondary | ICD-10-CM | POA: Diagnosis present

## 2024-06-16 DIAGNOSIS — Z8673 Personal history of transient ischemic attack (TIA), and cerebral infarction without residual deficits: Secondary | ICD-10-CM | POA: Diagnosis not present

## 2024-06-16 DIAGNOSIS — J690 Pneumonitis due to inhalation of food and vomit: Principal | ICD-10-CM | POA: Diagnosis present

## 2024-06-16 DIAGNOSIS — D696 Thrombocytopenia, unspecified: Secondary | ICD-10-CM | POA: Diagnosis present

## 2024-06-16 DIAGNOSIS — Z823 Family history of stroke: Secondary | ICD-10-CM

## 2024-06-16 DIAGNOSIS — Z0389 Encounter for observation for other suspected diseases and conditions ruled out: Secondary | ICD-10-CM | POA: Diagnosis not present

## 2024-06-16 DIAGNOSIS — I10 Essential (primary) hypertension: Secondary | ICD-10-CM | POA: Diagnosis not present

## 2024-06-16 DIAGNOSIS — N182 Chronic kidney disease, stage 2 (mild): Secondary | ICD-10-CM | POA: Diagnosis present

## 2024-06-16 DIAGNOSIS — Z1152 Encounter for screening for COVID-19: Secondary | ICD-10-CM

## 2024-06-16 DIAGNOSIS — I5032 Chronic diastolic (congestive) heart failure: Secondary | ICD-10-CM | POA: Diagnosis present

## 2024-06-16 DIAGNOSIS — F02818 Dementia in other diseases classified elsewhere, unspecified severity, with other behavioral disturbance: Secondary | ICD-10-CM | POA: Diagnosis present

## 2024-06-16 DIAGNOSIS — K8689 Other specified diseases of pancreas: Secondary | ICD-10-CM | POA: Diagnosis not present

## 2024-06-16 DIAGNOSIS — E785 Hyperlipidemia, unspecified: Secondary | ICD-10-CM | POA: Diagnosis present

## 2024-06-16 DIAGNOSIS — J189 Pneumonia, unspecified organism: Principal | ICD-10-CM | POA: Diagnosis present

## 2024-06-16 DIAGNOSIS — K429 Umbilical hernia without obstruction or gangrene: Secondary | ICD-10-CM | POA: Diagnosis present

## 2024-06-16 DIAGNOSIS — G309 Alzheimer's disease, unspecified: Secondary | ICD-10-CM | POA: Diagnosis present

## 2024-06-16 DIAGNOSIS — R0989 Other specified symptoms and signs involving the circulatory and respiratory systems: Secondary | ICD-10-CM | POA: Diagnosis not present

## 2024-06-16 DIAGNOSIS — Z6830 Body mass index (BMI) 30.0-30.9, adult: Secondary | ICD-10-CM

## 2024-06-16 DIAGNOSIS — I130 Hypertensive heart and chronic kidney disease with heart failure and stage 1 through stage 4 chronic kidney disease, or unspecified chronic kidney disease: Secondary | ICD-10-CM | POA: Diagnosis present

## 2024-06-16 DIAGNOSIS — A419 Sepsis, unspecified organism: Secondary | ICD-10-CM | POA: Diagnosis not present

## 2024-06-16 DIAGNOSIS — Z79899 Other long term (current) drug therapy: Secondary | ICD-10-CM | POA: Diagnosis not present

## 2024-06-16 LAB — URINALYSIS, ROUTINE W REFLEX MICROSCOPIC
Bilirubin Urine: NEGATIVE
Glucose, UA: NEGATIVE mg/dL
Hgb urine dipstick: NEGATIVE
Ketones, ur: 5 mg/dL — AB
Leukocytes,Ua: NEGATIVE
Nitrite: NEGATIVE
Protein, ur: NEGATIVE mg/dL
Specific Gravity, Urine: 1.021 (ref 1.005–1.030)
pH: 6 (ref 5.0–8.0)

## 2024-06-16 LAB — COMPREHENSIVE METABOLIC PANEL WITH GFR
ALT: 22 U/L (ref 0–44)
AST: 42 U/L — ABNORMAL HIGH (ref 15–41)
Albumin: 4.1 g/dL (ref 3.5–5.0)
Alkaline Phosphatase: 52 U/L (ref 38–126)
Anion gap: 11 (ref 5–15)
BUN: 18 mg/dL (ref 8–23)
CO2: 26 mmol/L (ref 22–32)
Calcium: 9.1 mg/dL (ref 8.9–10.3)
Chloride: 99 mmol/L (ref 98–111)
Creatinine, Ser: 1.28 mg/dL — ABNORMAL HIGH (ref 0.61–1.24)
GFR, Estimated: 58 mL/min — ABNORMAL LOW (ref >60)
Glucose, Bld: 106 mg/dL — ABNORMAL HIGH (ref 70–99)
Potassium: 4.2 mmol/L (ref 3.5–5.1)
Sodium: 135 mmol/L (ref 135–145)
Total Bilirubin: 0.7 mg/dL (ref 0.0–1.2)
Total Protein: 7.5 g/dL (ref 6.5–8.1)

## 2024-06-16 LAB — LACTIC ACID, PLASMA: Lactic Acid, Venous: 1.7 mmol/L (ref 0.5–1.9)

## 2024-06-16 LAB — RESP PANEL BY RT-PCR (RSV, FLU A&B, COVID)  RVPGX2
Influenza A by PCR: NEGATIVE
Influenza B by PCR: NEGATIVE
Resp Syncytial Virus by PCR: NEGATIVE
SARS Coronavirus 2 by RT PCR: NEGATIVE

## 2024-06-16 LAB — CBC WITH DIFFERENTIAL/PLATELET
Abs Immature Granulocytes: 0.02 K/uL (ref 0.00–0.07)
Basophils Absolute: 0.1 K/uL (ref 0.0–0.1)
Basophils Relative: 1 %
Eosinophils Absolute: 0 K/uL (ref 0.0–0.5)
Eosinophils Relative: 0 %
HCT: 41.6 % (ref 39.0–52.0)
Hemoglobin: 13.9 g/dL (ref 13.0–17.0)
Immature Granulocytes: 0 %
Lymphocytes Relative: 16 %
Lymphs Abs: 1.4 K/uL (ref 0.7–4.0)
MCH: 34.4 pg — ABNORMAL HIGH (ref 26.0–34.0)
MCHC: 33.4 g/dL (ref 30.0–36.0)
MCV: 103 fL — ABNORMAL HIGH (ref 80.0–100.0)
Monocytes Absolute: 1 K/uL (ref 0.1–1.0)
Monocytes Relative: 12 %
Neutro Abs: 6 K/uL (ref 1.7–7.7)
Neutrophils Relative %: 71 %
Platelets: 131 K/uL — ABNORMAL LOW (ref 150–400)
RBC: 4.04 MIL/uL — ABNORMAL LOW (ref 4.22–5.81)
RDW: 14.4 % (ref 11.5–15.5)
WBC: 8.4 K/uL (ref 4.0–10.5)
nRBC: 0 % (ref 0.0–0.2)

## 2024-06-16 LAB — PROTIME-INR
INR: 1.1 (ref 0.8–1.2)
Prothrombin Time: 14.8 s (ref 11.4–15.2)

## 2024-06-16 LAB — PROCALCITONIN: Procalcitonin: 0.12 ng/mL

## 2024-06-16 MED ORDER — LACTATED RINGERS IV BOLUS
1000.0000 mL | Freq: Once | INTRAVENOUS | Status: AC
  Administered 2024-06-16: 1000 mL via INTRAVENOUS

## 2024-06-16 MED ORDER — ALBUTEROL SULFATE (2.5 MG/3ML) 0.083% IN NEBU: 2.5000 mg | INHALATION_SOLUTION | RESPIRATORY_TRACT | Status: DC | PRN

## 2024-06-16 MED ORDER — DM-GUAIFENESIN ER 30-600 MG PO TB12
1.0000 | ORAL_TABLET | Freq: Two times a day (BID) | ORAL | Status: DC | PRN
  Administered 2024-06-17: 1 via ORAL
  Filled 2024-06-16: qty 1

## 2024-06-16 MED ORDER — ONDANSETRON HCL 4 MG/2ML IJ SOLN: 4.0000 mg | Freq: Three times a day (TID) | INTRAMUSCULAR | Status: DC | PRN

## 2024-06-16 MED ORDER — ACETAMINOPHEN 325 MG PO TABS
650.0000 mg | ORAL_TABLET | Freq: Four times a day (QID) | ORAL | Status: DC | PRN
  Administered 2024-06-17: 650 mg via ORAL
  Filled 2024-06-16: qty 2

## 2024-06-16 MED ORDER — SODIUM CHLORIDE 0.9 % IV SOLN
2.0000 g | Freq: Once | INTRAVENOUS | Status: AC
  Administered 2024-06-16: 2 g via INTRAVENOUS
  Filled 2024-06-16: qty 20

## 2024-06-16 MED ORDER — IOHEXOL 300 MG/ML  SOLN
100.0000 mL | Freq: Once | INTRAMUSCULAR | Status: AC | PRN
  Administered 2024-06-16: 100 mL via INTRAVENOUS

## 2024-06-16 MED ORDER — VALPROATE SODIUM 100 MG/ML IV SOLN
500.0000 mg | Freq: Two times a day (BID) | INTRAVENOUS | Status: DC
  Administered 2024-06-17: 500 mg via INTRAVENOUS
  Filled 2024-06-16: qty 5

## 2024-06-16 MED ORDER — SODIUM CHLORIDE 0.9 % IV SOLN
500.0000 mg | Freq: Once | INTRAVENOUS | Status: AC
  Administered 2024-06-16: 500 mg via INTRAVENOUS
  Filled 2024-06-16: qty 5

## 2024-06-16 MED ORDER — HYDRALAZINE HCL 20 MG/ML IJ SOLN
5.0000 mg | INTRAMUSCULAR | Status: DC | PRN
Start: 2024-06-16 — End: 2024-06-19

## 2024-06-16 MED ORDER — FENTANYL CITRATE (PF) 50 MCG/ML IJ SOSY
100.0000 ug | PREFILLED_SYRINGE | Freq: Once | INTRAMUSCULAR | Status: AC
  Administered 2024-06-16: 100 ug via INTRAVENOUS
  Filled 2024-06-16: qty 2

## 2024-06-16 MED ORDER — ACETAMINOPHEN 500 MG PO TABS
1000.0000 mg | ORAL_TABLET | Freq: Once | ORAL | Status: AC
  Administered 2024-06-16: 1000 mg via ORAL
  Filled 2024-06-16: qty 2

## 2024-06-16 MED ORDER — LORAZEPAM 2 MG/ML IJ SOLN: 2.0000 mg | INTRAMUSCULAR | Status: DC | PRN

## 2024-06-16 NOTE — ED Triage Notes (Signed)
 Chief Complaint  Patient presents with   Fever   Altered Mental Status    BIBA PTAR. No eating/ drinking x 3 days. HX dementia. Fever 103.6   Blood pressure (!) 152/88, pulse 96, temperature 100.3 F (37.9 C), temperature source Oral, resp. rate (!) 28, SpO2 90%.

## 2024-06-16 NOTE — ED Provider Notes (Signed)
 [facility] Provider Note    Event Date/Time   First MD Initiated Contact with Patient 06/16/24 1742     (approximate)   History   Fever and Altered Mental Status (BIBA PTAR. No eating/ drinking x 3 days. HX dementia. Fever 103.6)   HPI  Kurt Bailey is a 78 y.o. male who presents to the ED for evaluation of Fever and Altered Mental Status (BIBA PTAR. No eating/ drinking x 3 days. HX dementia. Fever 103.6)   Review a neurology clinic visit from August.  Mixed Alzheimer's and vascular dementia, stroke, fibromyalgia and parkinsonism.  Patient presents to the ED via EMS from home for evaluation of not feeling well for the past few days.  Noted to have a fever with EMS 103 degrees, did not provide any medications, down to 100.3 degrees here in the ED.  He denies any focal symptoms such as cough, shortness of breath, chest pain, emesis, diarrhea, abdominal pain, rash or other particular concerns.  Reports a general sensation of feeling like crap.  Physical Exam   Triage Vital Signs: ED Triage Vitals  Encounter Vitals Group     BP      Girls Systolic BP Percentile      Girls Diastolic BP Percentile      Boys Systolic BP Percentile      Boys Diastolic BP Percentile      Pulse      Resp      Temp      Temp src      SpO2      Weight      Height      Head Circumference      Peak Flow      Pain Score      Pain Loc      Pain Education      Exclude from Growth Chart     Most recent vital signs: Vitals:   06/16/24 1914 06/16/24 2216  BP: 134/80 (!) 154/85  Pulse: 76 77  Resp: 20 20  Temp: 99.6 F (37.6 C) 99.6 F (37.6 C)  SpO2: 93% 92%    General: Awake, no distress.  CV:  Good peripheral perfusion.  Resp:  Normal effort.  Abd:  No distention.  Soft and nontender MSK:  No deformity noted.  No signs of trauma or rash Neuro:  No focal deficits appreciated. Other:     ED Results / Procedures / Treatments   Labs (all labs  ordered are listed, but only abnormal results are displayed) Labs Reviewed  COMPREHENSIVE METABOLIC PANEL WITH GFR - Abnormal; Notable for the following components:      Result Value   Glucose, Bld 106 (*)    Creatinine, Ser 1.28 (*)    AST 42 (*)    GFR, Estimated 58 (*)    All other components within normal limits  CBC WITH DIFFERENTIAL/PLATELET - Abnormal; Notable for the following components:   RBC 4.04 (*)    MCV 103.0 (*)    MCH 34.4 (*)    Platelets 131 (*)    All other components within normal limits  URINALYSIS, ROUTINE W REFLEX MICROSCOPIC - Abnormal; Notable for the following components:   Color, Urine YELLOW (*)    APPearance CLEAR (*)    Ketones, ur 5 (*)    All other components within normal limits  RESP PANEL BY RT-PCR (RSV, FLU A&B, COVID)  RVPGX2  CULTURE, BLOOD (ROUTINE X 2)  CULTURE, BLOOD (ROUTINE X  2)  URINE CULTURE  EXPECTORATED SPUTUM ASSESSMENT W GRAM STAIN, RFLX TO RESP C  LACTIC ACID, PLASMA  PROTIME-INR  PROCALCITONIN  PRO BRAIN NATRIURETIC PEPTIDE  APTT  LEGIONELLA PNEUMOPHILA SEROGP 1 UR AG  STREP PNEUMONIAE URINARY ANTIGEN  BASIC METABOLIC PANEL WITH GFR  CBC  TYPE AND SCREEN    EKG Sinus rhythm with a rate of 94 bpm.  Normal axis and intervals.  No acute signs of acute ischemia.  RADIOLOGY CXR interpreted by me without evidence of acute cardiopulmonary pathology. CT head interpreted by me without evidence of acute intracranial pathology CT chest, abdomen and pelvis interpreted by me with umbilical fat-containing hernia and signs of pneumonia  Official radiology report(s): CT HEAD WO CONTRAST ( ) Result Date: 06/16/2024 EXAM: CT HEAD WITHOUT CONTRAST 06/16/2024 11:00:21 PM TECHNIQUE: CT of the head was performed without the administration of intravenous contrast. Automated exposure control, iterative reconstruction, and/or weight based adjustment of the mA/kV was utilized to reduce the radiation dose to as low as reasonably achievable.  COMPARISON: None available. CLINICAL HISTORY: sepsis, altered FINDINGS: BRAIN AND VENTRICLES: Patchy and confluent areas of decreased attenuation are noted throughout the deep and periventricular white matter of the cerebral hemispheres bilaterally suggestive of chronic microvascular ischemic changes. Atherosclerotic calcifications are present within the cavernous internal carotid arteries. No acute hemorrhage. No evidence of acute infarct. No hydrocephalus. No extra-axial collection. No mass effect or midline shift. ORBITS: No acute abnormality. SINUSES: No acute abnormality. SOFT TISSUES AND SKULL: No acute soft tissue abnormality. No skull fracture. IMPRESSION: 1. No acute intracranial abnormality. Electronically signed by: Morgane Naveau MD 06/16/2024 11:15 PM EST RP Workstation: [id number]   CT CHEST ABDOMEN PELVIS W CONTRAST Result Date: 06/16/2024 EXAM: CT CHEST, ABDOMEN AND PELVIS WITH CONTRAST 06/16/2024 11:00:21 PM TECHNIQUE: CT of the chest, abdomen and pelvis was performed with the administration of 100 mL iohexol  (OMNIPAQUE ) 300 MG/ML solution. Multiplanar reformatted images are provided for review. Automated exposure control, iterative reconstruction, and/or weight based adjustment of the mA/kV was utilized to reduce the radiation dose to as low as reasonably achievable. COMPARISON: CT angio chest 05/13/2020 CLINICAL HISTORY: sepsis, SOB, altered, no clear source FINDINGS: CHEST: MEDIASTINUM AND LYMPH NODES: Heart and pericardium are unremarkable. Aortic valve leaflet calcification. Coronary artery calcifications. The central airways are clear. Prominent but nonenlarged mediastinal lymph nodes. No hilar lymph node enlargement. No axillary lymph node enlargement. Moderate atherosclerotic plaque of the thoracic aorta. LUNGS AND PLEURA: Left lower lobe mucous plugging and developing basilar peribronchovascular airspace opacity. Diffuse bronchial wall thickening. Stable chronic Right upper lobe  calcified subpleural 5 x 4 mm pulmonary nodule consistent with a granuloma - no further follow-up indicated. SABRA No pleural effusion or pneumothorax. ABDOMEN AND PELVIS: LIVER: The liver is unremarkable. GALLBLADDER AND BILE DUCTS: Gallbladder is unremarkable. No biliary ductal dilatation. SPLEEN: Spontules are noted. The spleen is unremarkable. PANCREAS: Diffusely atrophic. No focal lesion. Otherwise normal pancreatic contour. No surrounding inflammatory changes. No main pancreatic ductal dilatation. ADRENAL GLANDS: No acute abnormality. KIDNEYS, URETERS AND BLADDER: No stones in the kidneys or ureters. No hydronephrosis. No perinephric or periureteral stranding. No filling defects of the partially visualized collecting systems on delayed imaging. Urinary bladder is unremarkable. GI AND BOWEL: Stomach demonstrates no acute abnormality. No small or large bowel thickening or dilatation. Colonic diverticulosis. The appendix caliber measures at the upper limits of normal with no periappendiceal inflammatory changes. There is no bowel obstruction. REPRODUCTIVE ORGANS: Prostate is unremarkable. PERITONEUM AND RETROPERITONEUM: No ascites. No free air. VASCULATURE:  Aorta is normal in caliber. ABDOMINAL AND PELVIS LYMPH NODES: No lymphadenopathy. BONES AND SOFT TISSUES: Moderate to large volume umbilical hernia containing fat with an abdominal defect of 3.2 cm and associated mild fat stranding. Correlate with physical exam for incarceration. Tiny fat-containing right inguinal hernia. Diffusely decreased bone density. Chronic superior endplate L1 fracture. Severe L5-S1 degenerative changes. No focal soft tissue abnormality. IMPRESSION: 1. Left lower lobe mucous plugging with developing basilar peribronchovascular airspace opacity, suggestive of developing pneumonia or aspiration pneumonitis. 2. Moderate to large umbilical hernia containing fat with a 3.2 cm defect and mild fat stranding; recommend prompt surgical evaluation to  assess for incarceration or impending strangulation. 3. Other, non-acute and/or normal findings as above. Electronically signed by: Morgane Naveau MD 06/16/2024 11:06 PM EST RP Workstation: [id number]   DG Chest Port 1 View Result Date: 06/16/2024 EXAM: 1 VIEW(S) XRAY OF THE CHEST 09/18/2023 COMPARISON: CT chest 05/13/20, chest x-ray 09/18/2023 CLINICAL HISTORY: Questionable sepsis - evaluate for abnormality FINDINGS: LUNGS AND PLEURA: 6 mm calcified right upper lobe pulmonary nodule stable consistent with a granuloma. No focal pulmonary opacity. No pleural effusion. No pneumothorax. HEART AND MEDIASTINUM: Low lung volumes. Unchanged cardiomediastinal silhouette. No acute abnormality of the cardiac and mediastinal silhouettes. BONES AND SOFT TISSUES: No acute osseous abnormality. IMPRESSION: 1. No acute cardiopulmonary process. Electronically signed by: Kate Plummer MD 06/16/2024 06:34 PM EST RP Workstation: [id number]    PROCEDURES and INTERVENTIONS:  .1-3 Lead EKG Interpretation  Performed by: Claudene Rover, MD Authorized by: Claudene Rover, MD     Interpretation: normal     ECG rate:  94   ECG rate assessment: normal     Rhythm: sinus rhythm     Ectopy: none     Conduction: normal   .Critical Care  Performed by: Claudene Rover, MD Authorized by: Claudene Rover, MD   Critical care provider statement:    Critical care time (minutes):  30   Critical care time was exclusive of:  Separately billable procedures and treating other patients   Critical care was necessary to treat or prevent imminent or life-threatening deterioration of the following conditions:  Sepsis and respiratory failure   Critical care was time spent personally by me on the following activities:  Development of treatment plan with patient or surrogate, discussions with consultants, evaluation of patient's response to treatment, examination of patient, ordering and review of laboratory studies, ordering and review of radiographic  studies, ordering and performing treatments and interventions, pulse oximetry, re-evaluation of patient's condition and review of old charts   Medications  azithromycin (ZITHROMAX) 500 mg in sodium chloride  0.9 % 250 mL IVPB (500 mg Intravenous New Bag/Given 06/16/24 2345)  albuterol  (PROVENTIL ) (2.5 MG/3ML) 0.083% nebulizer solution 2.5 mg (has no administration in time range)  dextromethorphan -guaiFENesin  (MUCINEX  DM) 30-600 MG per 12 hr tablet 1 tablet (has no administration in time range)  ondansetron  (ZOFRAN ) injection 4 mg (has no administration in time range)  hydrALAZINE  (APRESOLINE ) injection 5 mg (has no administration in time range)  acetaminophen  (TYLENOL ) tablet 650 mg (has no administration in time range)  LORazepam (ATIVAN) injection 2 mg (has no administration in time range)  valproate (DEPACON) 500 mg in dextrose 5 % 50 mL IVPB (has no administration in time range)  lactated ringers bolus 1,000 mL (0 mLs Intravenous Stopped 06/16/24 2156)  acetaminophen  (TYLENOL ) tablet 1,000 mg (1,000 mg Oral Given 06/16/24 1814)  iohexol  (OMNIPAQUE ) 300 MG/ML solution 100 mL (100 mLs Intravenous Contrast Given 06/16/24 2250)  cefTRIAXone (ROCEPHIN) 2  g in sodium chloride  0.9 % 100 mL IVPB (2 g Intravenous New Bag/Given 06/16/24 2326)  fentaNYL (SUBLIMAZE) injection 100 mcg (100 mcg Intravenous Given 06/16/24 2341)     IMPRESSION / MDM / ASSESSMENT AND PLAN / ED COURSE  I reviewed the triage vital signs and the nursing notes.  Differential diagnosis includes, but is not limited to, metabolic encephalopathy, stroke, pneumonia, sepsis, UTI, AKI, hyperkalemia  {Patient presents with symptoms of an acute illness or injury that is potentially life-threatening.  Patient presents with fever and weakness with evidence of sepsis from pneumonia requiring medical admission.  Vital signs with heart rate over 90, respirations over 20, low-grade temperature suggest SIRS criteria/sepsis.  No  leukocytosis or lactic acidosis.  No significant metabolic derangements.  Procalcitonin 0.12.  CXR does not clearly have pneumonia but high suspicion for this considering his tachypnea.  CT confirms pneumonia.  He does have an incarcerated fat-containing hernia to his umbilicus.  Family reports this has been present and like this for many years.  No signs of SBO, despite fentanyl and good relaxation I am unable to reduce this effectively.  Clinical Course as of 06/16/24 2358  Sat Jun 16, 2024  1800 Fv not feeling good [DS]  2356 After fentanyl he does require supplemental oxygen, this was to facilitate reducing incarcerated fat-containing umbilical hernia.  I was unable to reduce hernia with manipulation [DS]    Clinical Course User Index [DS] Claudene Rover, MD     FINAL CLINICAL IMPRESSION(S) / ED DIAGNOSES   Final diagnoses:  Sepsis due to pneumonia [facility])     Rx / DC Orders   ED Discharge Orders     None        Note:  This document was prepared using Dragon voice recognition software and may include unintentional dictation errors.   Claudene Rover, MD 06/16/24 [phone]

## 2024-06-16 NOTE — H&P (Signed)
 History and Physical    Kurt Bailey FMW:[id number] DOB: 1946/01/30 DOA: 06/16/2024  Referring MD/NP/PA:   PCP: Gretta Comer POUR, NP   Patient coming from:  The patient is coming from home.     Chief Complaint: Fever, cough  HPI: Kurt Bailey is a 78 y.o. male with medical history significant of dementia, Parkinson's disease, dCHF, HTN, HLD, CKD stage II, alcohol use in remission, thrombocytopenia, diverticulitis, seizure, chronic umbilical hernia for years, who presents with fever and cough.  Per his wife and son at the bedside, patient started having fever and dry cough since last night.  He had fever 100.3 at home.  No chest pain or SOB. No respiratory distress.  Patient has chronic umbilical hernia.  No nausea, vomiting, diarrhea or abdominal pain.  No symptoms UTI.  Patient had some confusion earlier, which has resolved.  When I saw patient, his mental status is at baseline.  He answered most of the questions appropriately.  Moves all extremities normally.  Denies any abdominal pain.  I have tried to review his medications, but both patient and family members cannot provide detailed information about his medications.   Data reviewed independently and ED Course: pt was found to have WBC 8.4, procalcitonin 0.12, proBNP 786, worsening renal function, negative UA, negative PCR for COVID, flu and RSV, lactic acid 1..  Temperature 99.6, blood pressure 154/85, heart rate 96, RR 28, oxygen saturation 90 on room air, which improved to 93% on 2 L oxygen.  Chest x-ray negative.  CT of head negative for acute intracranial abnormalities.  Patient is admitted to telemetry bed as inpatient.  Epic message is sent to Dr. Cesar for consult at 6 AM  CT of abdomen/pelvis: 1. Left lower lobe mucous plugging with developing basilar peribronchovascular airspace opacity, suggestive of developing pneumonia or aspiration pneumonitis. 2. Moderate to large umbilical hernia containing fat with a 3.2  cm defect and mild fat stranding; recommend prompt surgical evaluation to assess for incarceration or impending strangulation. 3. Other, non-acute and/or normal findings as above.   EKG: I have personally reviewed.  Sinus rhythm, QTc 428, qWave in lead III/aVF, nonspecific T wave change.  Review of Systems:   General: no fevers, chills, no body weight gain, fatigue HEENT: no blurry vision, hearing changes or sore throat Respiratory: no dyspnea, has coughing, no wheezing CV: no chest pain, no palpitations GI: no nausea, vomiting, abdominal pain, diarrhea, constipation GU: no dysuria, burning on urination, increased urinary frequency, hematuria. Has umbilical hernia Ext: no leg edema Neuro: no unilateral weakness, numbness, or tingling, no vision change or hearing loss Skin: no rash, no skin tear. MSK: No muscle spasm, no deformity, no limitation of range of movement in spin Heme: No easy bruising.  Travel history: No recent long distant travel.   Allergy: No Known Allergies  Past Medical History:  Diagnosis Date   Acute hypoxemic respiratory failure due to COVID-19 (HCC) 05/13/2020   Acute urinary retention 09/14/2023   Alcohol abuse    Diverticulitis    Periumbilical hernia     History reviewed. No pertinent surgical history.  Social History:  reports that he has never smoked. He has never used smokeless tobacco. He reports that he does not currently use alcohol. He reports that he does not use drugs.  Family History:  Family History  Problem Relation Age of Onset   Alcohol abuse Father    Stroke Father    Hypertension Father    Emphysema Father  Breast cancer Sister    Heart attack Brother    Aneurysm Sister    Stroke Brother    Hyperlipidemia Brother      Prior to Admission medications   Medication Sig Start Date End Date Taking? Authorizing Provider  acetaminophen  (TYLENOL ) 500 MG tablet Take 500 mg by mouth every 6 (six) hours as needed.    [provider]  atorvastatin  (LIPITOR) 40 MG tablet TAKE 1 TABLET BY MOUTH ONCE DAILY FOR CHOLESTEROL 02/16/24   Clark, Katherine K, NP  cyanocobalamin  1000 MCG tablet Take by mouth.    [provider]  divalproex  (DEPAKOTE ) 500 MG DR tablet Take 500 mg by mouth 2 (two) times daily.    [provider]  Ipratropium-Albuterol  (COMBIVENT  RESPIMAT) 20-100 MCG/ACT AERS respimat Inhale 1 puff into the lungs every 6 (six) hours as needed for wheezing. 09/19/23 10/19/23  Josette Ade, MD  memantine  (NAMENDA ) 10 MG tablet Take 10 mg by mouth 2 (two) times daily.  03/26/19 09/10/23  [provider]  Omega-3 1000 MG CAPS Take 1,000 mg by mouth every evening.     [provider]  potassium chloride  SA (KLOR-CON  M) 20 MEQ tablet Take 1 tablet (20 mEq total) by mouth 2 (two) times daily. For low potassium. 11/21/23   Clark, Katherine K, NP  vitamin B-12 (CYANOCOBALAMIN ) 1000 MCG tablet Take 1,000 mcg by mouth every evening.     [provider]    Physical Exam: Vitals:   06/16/24 1800 06/16/24 1914 06/16/24 2216 06/17/24 0024  BP: (!) 147/92 134/80 (!) 154/85 (!) 139/90  Pulse: 91 76 77 87  Resp: 17 20 20    Temp:  99.6 F (37.6 C) 99.6 F (37.6 C) 99 F (37.2 C)  TempSrc:  Oral Oral   SpO2:  93% 92% 98%   General: Not in acute distress HEENT:       Eyes: PERRL, EOMI, no jaundice       ENT: No discharge from the ears and nose, no pharynx injection, no tonsillar enlargement.        Neck: No JVD, no bruit, no mass felt. Heme: No neck lymph node enlargement. Cardiac: S1/S2, RRR, no gallops or rubs. Respiratory: No rales, wheezing, rhonchi or rubs. GI: Soft, nondistended, nontender, no rebound pain, no organomegaly, BS present. Has umbilical hernia GU: No hematuria Ext: No pitting leg edema bilaterally. 1+DP/PT pulse bilaterally. Musculoskeletal: No joint deformities, No joint redness or warmth, no limitation of ROM in spin. Skin: No rashes.  Neuro: Alert,  oriented X3, cranial nerves II-XII grossly intact, moves all extremities normally.  Psych: Patient is not psychotic, no suicidal or hemocidal ideation.  Labs on Admission: I have personally reviewed following labs and imaging studies  CBC: Recent Labs  Lab 06/16/24 1753  WBC 8.4  NEUTROABS 6.0  HGB 13.9  HCT 41.6  MCV 103.0*  PLT 131*   Basic Metabolic Panel: Recent Labs  Lab 06/16/24 1753  NA 135  K 4.2  CL 99  CO2 26  GLUCOSE 106*  BUN 18  CREATININE 1.28*  CALCIUM  9.1   GFR: CrCl cannot be calculated (Unknown ideal weight.). Liver Function Tests: Recent Labs  Lab 06/16/24 1753  AST 42*  ALT 22  ALKPHOS 52  BILITOT 0.7  PROT 7.5  ALBUMIN 4.1   No results for input(s): LIPASE, AMYLASE in the last 168 hours. No results for input(s): AMMONIA in the last 168 hours. Coagulation Profile: Recent Labs  Lab 06/16/24 1753  INR 1.1  Cardiac Enzymes: No results for input(s): CKTOTAL, CKMB, CKMBINDEX, TROPONINI in the last 168 hours. BNP (last 3 results) Recent Labs    06/16/24 1753  PROBNP 786.0*   HbA1C: No results for input(s): HGBA1C in the last 72 hours. CBG: No results for input(s): GLUCAP in the last 168 hours. Lipid Profile: No results for input(s): CHOL, HDL, LDLCALC, TRIG, CHOLHDL, LDLDIRECT in the last 72 hours. Thyroid  Function Tests: No results for input(s): TSH, T4TOTAL, FREET4, T3FREE, THYROIDAB in the last 72 hours. Anemia Panel: No results for input(s): VITAMINB12, FOLATE, FERRITIN, TIBC, IRON, RETICCTPCT in the last 72 hours. Urine analysis:    Component Value Date/Time   COLORURINE YELLOW (A) 06/16/2024 1914   APPEARANCEUR CLEAR (A) 06/16/2024 1914   APPEARANCEUR Clear 03/13/2014 0615   LABSPEC 1.021 06/16/2024 1914   LABSPEC 1.006 03/13/2014 0615   PHURINE 6.0 06/16/2024 1914   GLUCOSEU NEGATIVE 06/16/2024 1914   GLUCOSEU Negative 03/13/2014 0615   HGBUR NEGATIVE 06/16/2024  1914   BILIRUBINUR NEGATIVE 06/16/2024 1914   BILIRUBINUR Negative 03/13/2014 0615   KETONESUR 5 (A) 06/16/2024 1914   PROTEINUR NEGATIVE 06/16/2024 1914   NITRITE NEGATIVE 06/16/2024 1914   LEUKOCYTESUR NEGATIVE 06/16/2024 1914   LEUKOCYTESUR Negative 03/13/2014 0615   Sepsis Labs: @LABRCNTIP (procalcitonin:4,lacticidven:4) ) Recent Results (from the past 240 hours)  Resp panel by RT-PCR (RSV, Flu A&B, Covid) Anterior Nasal Swab     Status: None   Collection Time: 06/16/24  5:53 PM   Specimen: Anterior Nasal Swab  Result Value Ref Range Status   SARS Coronavirus 2 by RT PCR NEGATIVE NEGATIVE Final    Comment: (NOTE) SARS-CoV-2 target nucleic acids are NOT DETECTED.  The SARS-CoV-2 RNA is generally detectable in upper respiratory specimens during the acute phase of infection. The lowest concentration of SARS-CoV-2 viral copies this assay can detect is 138 copies/mL. A negative result does not preclude SARS-Cov-2 infection and should not be used as the sole basis for treatment or other patient management decisions. A negative result may occur with  improper specimen collection/handling, submission of specimen other than nasopharyngeal swab, presence of viral mutation(s) within the areas targeted by this assay, and inadequate number of viral copies(<138 copies/mL). A negative result must be combined with clinical observations, patient history, and epidemiological information. The expected result is Negative.  Fact Sheet for Patients:  bloggercourse.com  Fact Sheet for Healthcare Providers:  seriousbroker.it  This test is no t yet approved or cleared by the [place]  FDA and  has been authorized for detection and/or diagnosis of SARS-CoV-2 by FDA under an Emergency Use Authorization (EUA). This EUA will remain  in effect (meaning this test can be used) for the duration of the COVID-19 declaration under Section 564(b)(1) of  the Act, 21 U.S.C.section 360bbb-3(b)(1), unless the authorization is terminated  or revoked sooner.       Influenza A by PCR NEGATIVE NEGATIVE Final   Influenza B by PCR NEGATIVE NEGATIVE Final    Comment: (NOTE) The Xpert Xpress SARS-CoV-2/FLU/RSV plus assay is intended as an aid in the diagnosis of influenza from Nasopharyngeal swab specimens and should not be used as a sole basis for treatment. Nasal washings and aspirates are unacceptable for Xpert Xpress SARS-CoV-2/FLU/RSV testing.  Fact Sheet for Patients: bloggercourse.com  Fact Sheet for Healthcare Providers: seriousbroker.it  This test is not yet approved or cleared by the [place]  FDA and has been authorized for detection and/or diagnosis of SARS-CoV-2 by FDA under an Emergency Use Authorization (EUA). This EUA will  remain in effect (meaning this test can be used) for the duration of the COVID-19 declaration under Section 564(b)(1) of the Act, 21 U.S.C. section 360bbb-3(b)(1), unless the authorization is terminated or revoked.     Resp Syncytial Virus by PCR NEGATIVE NEGATIVE Final    Comment: (NOTE) Fact Sheet for Patients: bloggercourse.com  Fact Sheet for Healthcare Providers: seriousbroker.it  This test is not yet approved or cleared by the [place]  FDA and has been authorized for detection and/or diagnosis of SARS-CoV-2 by FDA under an Emergency Use Authorization (EUA). This EUA will remain in effect (meaning this test can be used) for the duration of the COVID-19 declaration under Section 564(b)(1) of the Act, 21 U.S.C. section 360bbb-3(b)(1), unless the authorization is terminated or revoked.  Performed at [facility], [address], [place], [place] [address]      Radiological Exams on Admission:   Assessment/Plan Principal Problem:   CAP (community acquired pneumonia) vs.  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 aspiration pneumonia: Has 2 L of new oxygen requirement.  Temperature 99.6, no leukocytosis, clinically not septic. - Will admit to tele bed as inpt - IV Unasyn and azithromycin (patient received 1 dose of Rocephin in ED) - Mucinex  for cough  - Bronchodilators - Urine legionella and S. pneumococcal antigen - Follow up blood culture x2, sputum culture - will get Procalcitonin and trend lactic acid level per sepsis protocol - IVF: 1L of NS bolus in ED  Umbilical hernia: Per his son, patient has umbilical hernia for years. CT showed moderate to large umbilical hernia containing fat with a 3.2 cm defect and mild fat stranding.  Currently patient does not have any abdominal pain, no nausea vomiting.  Does not seem to have incarceration. -Epic message is sent to Dr. 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 -Namenda   Hyperlipidemia -Lipitor  Essential hypertension: blood pressure 154/85.  Patient is not taking medications. -IV hydralazine  as needed  Thrombocytopenia: This is chronic issue.  Platelets are 131. -Follow-up CBC  Chronic diastolic CHF (congestive heart failure) (HCC): 2D echo on 03/01/2022 showed EF> 55% with grade 1 diastolic dysfunction.  No leg edema.  proBNP 786.  CHF seems to be compensated. - Watch volume status closely  CKD (chronic kidney disease) stage 2, GFR 60-89 ml/min: Renal function is slightly worse than baseline.  Recent baseline creatinine 0.96 on 11/16/2023.  His creatinine is  1.28, BUN 18, GFR 58. -IV fluid: 1 L normal saline in ED - Avoid using renal toxic medications.  Seizure [facility]):  - Seizure precaution - As needed Ativan for seizure - Continue home Depakote  500 mg twice daily        DVT ppx: SCD  Code Status: Full code per his wife  Family Communication:   Yes, patient's son and the wife   at bed side.    Disposition Plan:  Anticipate discharge back to previous environment  Consults called: Epic message is sent to Dr. Cesar for consult at 6 AM  Admission status and Level of care: Telemetry:    for obs as inpt  Dispo: The patient is from: Home              Anticipated d/c is to: Home              Anticipated d/c date is: 2 days              Patient currently is not medically stable to d/c.    Severity of Illness:  The appropriate patient status for this patient is INPATIENT. Inpatient status is judged to be reasonable and necessary in order to provide the required intensity of service to ensure the patient's safety. The patient's presenting symptoms, physical exam findings, and initial radiographic and laboratory data in the context of their chronic comorbidities is felt to place them at high risk for further clinical deterioration. Furthermore, it is not anticipated that the patient will be medically stable for discharge from the hospital within 2 midnights of admission.   * I certify that at the point of admission it is my clinical judgment that the patient will require inpatient hospital care spanning beyond 2 midnights from the point of admission due to high intensity of service, high risk for further deterioration and high frequency of surveillance required.*       Date of Service 06/17/2024    Caleb Exon [facility]   If 7PM-7AM, please contact night-coverage [email or web address] 06/17/2024, 1:24 AM

## 2024-06-17 DIAGNOSIS — J189 Pneumonia, unspecified organism: Secondary | ICD-10-CM | POA: Diagnosis not present

## 2024-06-17 LAB — CBC
HCT: 40.4 % (ref 39.0–52.0)
Hemoglobin: 13.8 g/dL (ref 13.0–17.0)
MCH: 34.1 pg — ABNORMAL HIGH (ref 26.0–34.0)
MCHC: 34.2 g/dL (ref 30.0–36.0)
MCV: 99.8 fL (ref 80.0–100.0)
Platelets: 109 K/uL — ABNORMAL LOW (ref 150–400)
RBC: 4.05 MIL/uL — ABNORMAL LOW (ref 4.22–5.81)
RDW: 14.2 % (ref 11.5–15.5)
WBC: 7.9 K/uL (ref 4.0–10.5)
nRBC: 0 % (ref 0.0–0.2)

## 2024-06-17 LAB — TYPE AND SCREEN
ABO/RH(D): A POS
ABO/RH(D): A POS
Antibody Screen: NEGATIVE
Antibody Screen: NEGATIVE

## 2024-06-17 LAB — BASIC METABOLIC PANEL WITH GFR
Anion gap: 12 (ref 5–15)
BUN: 16 mg/dL (ref 8–23)
CO2: 25 mmol/L (ref 22–32)
Calcium: 8.8 mg/dL — ABNORMAL LOW (ref 8.9–10.3)
Chloride: 100 mmol/L (ref 98–111)
Creatinine, Ser: 1.13 mg/dL (ref 0.61–1.24)
GFR, Estimated: >60 mL/min (ref >60)
Glucose, Bld: 109 mg/dL — ABNORMAL HIGH (ref 70–99)
Potassium: 4 mmol/L (ref 3.5–5.1)
Sodium: 136 mmol/L (ref 135–145)

## 2024-06-17 LAB — STREP PNEUMONIAE URINARY ANTIGEN: Strep Pneumo Urinary Antigen: NEGATIVE

## 2024-06-17 LAB — PRO BRAIN NATRIURETIC PEPTIDE: Pro Brain Natriuretic Peptide: 786 pg/mL — ABNORMAL HIGH (ref <300.0)

## 2024-06-17 LAB — APTT: aPTT: 28 s (ref 24–36)

## 2024-06-17 MED ORDER — SODIUM CHLORIDE 0.9 % IV SOLN
3.0000 g | Freq: Four times a day (QID) | INTRAVENOUS | Status: DC
  Administered 2024-06-17 – 2024-06-19 (×10): 3 g via INTRAVENOUS
  Filled 2024-06-17 (×11): qty 8

## 2024-06-17 MED ORDER — IPRATROPIUM-ALBUTEROL 0.5-2.5 (3) MG/3ML IN SOLN
3.0000 mL | Freq: Three times a day (TID) | RESPIRATORY_TRACT | Status: DC
  Administered 2024-06-17 – 2024-06-18 (×3): 3 mL via RESPIRATORY_TRACT
  Filled 2024-06-17 (×3): qty 3

## 2024-06-17 MED ORDER — IPRATROPIUM-ALBUTEROL 0.5-2.5 (3) MG/3ML IN SOLN: 3.0000 mL | Freq: Four times a day (QID) | RESPIRATORY_TRACT | Status: DC

## 2024-06-17 MED ORDER — QUETIAPINE FUMARATE 25 MG PO TABS: 50.0000 mg | ORAL_TABLET | Freq: Every evening | ORAL | Status: DC | PRN

## 2024-06-17 MED ORDER — ENOXAPARIN SODIUM 40 MG/0.4ML IJ SOSY: 40.0000 mg | PREFILLED_SYRINGE | INTRAMUSCULAR | Status: DC

## 2024-06-17 MED ORDER — ENOXAPARIN SODIUM 40 MG/0.4ML IJ SOSY
40.0000 mg | PREFILLED_SYRINGE | INTRAMUSCULAR | Status: DC
  Administered 2024-06-17 – 2024-06-18 (×2): 40 mg via SUBCUTANEOUS
  Filled 2024-06-17 (×2): qty 0.4

## 2024-06-17 MED ORDER — ATORVASTATIN CALCIUM 20 MG PO TABS
40.0000 mg | ORAL_TABLET | Freq: Every day | ORAL | Status: DC
  Administered 2024-06-17 – 2024-06-19 (×3): 40 mg via ORAL
  Filled 2024-06-17 (×3): qty 2

## 2024-06-17 MED ORDER — VITAMIN B-12 1000 MCG PO TABS
1000.0000 ug | ORAL_TABLET | Freq: Every evening | ORAL | Status: DC
  Administered 2024-06-17 – 2024-06-18 (×2): 1000 ug via ORAL
  Filled 2024-06-17 (×2): qty 1

## 2024-06-17 MED ORDER — MEMANTINE HCL 5 MG PO TABS
10.0000 mg | ORAL_TABLET | Freq: Two times a day (BID) | ORAL | Status: DC
  Administered 2024-06-17 – 2024-06-19 (×5): 10 mg via ORAL
  Filled 2024-06-17 (×5): qty 2

## 2024-06-17 MED ORDER — ACETAMINOPHEN 325 MG PO TABS
650.0000 mg | ORAL_TABLET | Freq: Four times a day (QID) | ORAL | Status: DC
  Administered 2024-06-17 – 2024-06-19 (×7): 650 mg via ORAL
  Filled 2024-06-17 (×7): qty 2

## 2024-06-17 MED ORDER — SODIUM CHLORIDE 0.9 % IV SOLN
500.0000 mg | INTRAVENOUS | Status: DC
  Administered 2024-06-17 – 2024-06-18 (×2): 500 mg via INTRAVENOUS
  Filled 2024-06-17 (×2): qty 5

## 2024-06-17 MED ORDER — DIVALPROEX SODIUM 500 MG PO DR TAB
500.0000 mg | DELAYED_RELEASE_TABLET | Freq: Two times a day (BID) | ORAL | Status: DC
  Administered 2024-06-17 – 2024-06-19 (×5): 500 mg via ORAL
  Filled 2024-06-17 (×5): qty 1

## 2024-06-17 MED ORDER — OXYCODONE HCL 5 MG PO TABS: 5.0000 mg | ORAL_TABLET | ORAL | Status: DC | PRN

## 2024-06-17 NOTE — Hospital Course (Addendum)
 Hospital course / significant events:   Kurt Bailey is a 78 y.o. male with medical history significant of dementia, Parkinson's disease, dCHF, HTN, HLD, CKD stage II, alcohol use in remission, thrombocytopenia, diverticulitis, chronic umbilical hernia for years, who presents with fever and cough   11/15: to ED. Admitted w/ LLL PNA concern for mucus plugging. Also large umbilical hernia w/ concern on CT for fat stranding, question strangulation - surgical consult non urgently since pt not c/o abd pain.  11/16: per gen surg can follow outpatient. Remains on O2 this morning, improving into the afternoon.  11/17 SPO2 <88 on room air, continue on [place] and abx/nebs, added steroids     Consultants:  General surgery   Procedures/Surgeries: none      ASSESSMENT & PLAN:   CAP (community acquired pneumonia) vs. 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 Note NO history seizure based on [facility] note 03/2024  Movement disorder / Parkinsonism No meds at this time   Cerebrovascular disease  HLD Atherosclerosis  Statin   Essential hypertension not taking medications at home. IV hydralazine  as needed   Thrombocytopenia, chronic Monitor CBC   Chronic diastolic CHF (congestive heart failure)  2D echo on 03/01/2022 showed EF> 55% with grade 1 diastolic dysfunction.  No leg edema.  proBNP 786.  CHF   compensated. Watch volume status closely   CKD (chronic kidney disease) stage 2, GFR 60-89 ml/min: Renal function is slightly worse than baseline.  Recent baseline creatinine 0.96 on 11/16/2023.  His creatinine is 1.28, BUN 18, GFR 58. IV fluid: 1 L normal saline in ED Avoid using renal toxic medications.          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  Underweight - under 18  overweight - 25 to 29 obese - 30 or more Class 1 obesity: BMI of 30.0 to 34 Class 2 obesity: BMI of 35.0 to 39 Class 3 obesity: BMI of 40.0 to 49 Super Morbid Obesity: BMI 50-59 Super-super Morbid Obesity: BMI 60+ Healthy nutrition and physical activity advised as adjunct to other disease management and risk reduction treatments    DVT prophylaxis: lovenox  IV fluids: holding continuous IV fluids  Nutrition: regular diet  Central lines / other devices: none  Code Status: FULL CODE ACP documentation reviewed: none on file in [facility]  TOC needs: TBD pend PT/OT eval expect will need at least home health  Medical barriers to dispo: IV abx, O2, steroids. Expected medical readiness for discharge 1-2 days / pending clinical course .

## 2024-06-17 NOTE — Progress Notes (Signed)
 PROGRESS NOTE    Kurt Bailey   FMW:[id number] DOB: 04-06-1946  DOA: 06/16/2024 Date of Service: 06/17/24 which is hospital day 1  PCP: Gretta Comer POUR, NP    Hospital course / significant events:   Kurt Bailey is a 78 y.o. male with medical history significant of dementia, Parkinson's disease, dCHF, HTN, HLD, CKD stage II, alcohol use in remission, thrombocytopenia, diverticulitis, chronic umbilical hernia for years, who presents with fever and cough   11/15: to ED. Admitted w/ LLL PNA concern for mucus plugging. Also large umbilical hernia w/ concern on CT for fat stranding, question strangulation - surgical consult non urgently since pt not c/o abd pain.  11/16: per gen surg can follow outpatient. Remains on O2 this morning, improving into the afternoon.     Consultants:  General surgery   Procedures/Surgeries: none      ASSESSMENT & PLAN:   CAP (community acquired pneumonia) vs. aspiration pneumonia Acute hypoxic respiratory failure assoc w/ CAP  SIRS/Sepsis ruled out  IV Unasyn and azithromycin (patient received 1 dose of Rocephin in ED) Mucinex  for cough  Bronchodilators Urine legionella and S. 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 Note NO history seizure based on [facility] note 03/2024  Movement disorder / Parkinsonism No meds at this time   Cerebrovascular disease  HLD Atherosclerosis  Statin   Essential hypertension not taking medications at home. IV hydralazine  as needed   Thrombocytopenia, chronic Monitor CBC   Chronic diastolic CHF (congestive heart failure)  2D echo on 03/01/2022 showed EF> 55% with grade 1 diastolic dysfunction.  No leg edema.  proBNP 786.  CHF   compensated. Watch volume status closely   CKD (chronic kidney disease) stage 2, GFR 60-89 ml/min: Renal function is slightly worse than baseline.  Recent baseline creatinine 0.96 on 11/16/2023.  His creatinine is 1.28, BUN 18, GFR 58. IV fluid: 1 L normal saline in ED Avoid using renal toxic medications.                    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  Underweight - under 18  overweight - 25 to 29 obese - 30 or more Class 1 obesity: BMI of 30.0 to 34 Class 2 obesity: BMI of 35.0 to 39 Class 3 obesity: BMI of 40.0 to 49 Super Morbid Obesity: BMI 50-59 Super-super Morbid Obesity: BMI 60+ Healthy nutrition and physical activity advised as adjunct to other disease management and risk reduction treatments    DVT prophylaxis: lovenox  IV fluids: holding continuous IV fluids  Nutrition: regular diet  Central lines / other devices: none  Code Status: FULL CODE ACP documentation reviewed: none on file in [facility]  TOC needs: TBD pend PT/OT eval expect will need at least home health  Medical barriers to dispo: IV abx. Expected medical readiness for discharge 1-2 days / pending clinical course .              Subjective / Brief ROS:  Patient reports still SOB Denies CP/SOB.  Pain controlled on rounds this morning but afternoon per daughter he is c/o generalizes soreness  Denies new weakness.  Tolerating diet.  Reports no  concerns w/ urination/defecation.   Family Communication: spoke on phone w/ daughter 06/17/24 4:35 PM     Objective Findings:  Vitals:   06/17/24 0429 06/17/24 0517 06/17/24 0922 06/17/24 1234  BP: (!) 159/98  136/87 127/85  Pulse: 88  71 83  Resp: 15  18 16   Temp: (!) 101.8 F (38.8 C) 99.9 F (37.7 C) 98.2 F (36.8 C) 99.2 F (37.3 C)  TempSrc:  Oral Oral Oral  SpO2: 97%  97% 95%  Weight:      Height:        Intake/Output Summary  (Last 24 hours) at 06/17/2024 1624 Last data filed at 06/17/2024 0630 Gross per 24 hour  Intake 1450.65 ml  Output 910 ml  Net 540.65 ml   Filed Weights   06/17/24 0215  Weight: 90 kg    Examination:  Physical Exam Constitutional:      General: He is not in acute distress. Cardiovascular:     Rate and Rhythm: Normal rate and regular rhythm.  Pulmonary:     Effort: Pulmonary effort is normal.     Breath sounds: Wheezing present.  Musculoskeletal:     Right lower leg: No edema.     Left lower leg: No edema.  Neurological:     Mental Status: He is alert.  Psychiatric:        Mood and Affect: Mood normal.        Behavior: Behavior normal.          Scheduled Medications:   atorvastatin   40 mg Oral Daily   cyanocobalamin   1,000 mcg Oral QPM   divalproex   500 mg Oral BID   enoxaparin  (LOVENOX ) injection  40 mg Subcutaneous Q24H   memantine   10 mg Oral BID    Continuous Infusions:  ampicillin-sulbactam (UNASYN) IV 3 g (06/17/24 1358)   azithromycin (ZITHROMAX) 500 mg in sodium chloride  0.9 % 250 mL IVPB      PRN Medications:  acetaminophen , albuterol , dextromethorphan -guaiFENesin , hydrALAZINE , LORazepam, ondansetron  (ZOFRAN ) IV  Antimicrobials from admission:  Anti-infectives (From admission, onward)    Start     Dose/Rate Route Frequency Ordered Stop   06/17/24 2200  azithromycin (ZITHROMAX) 500 mg in sodium chloride  0.9 % 250 mL IVPB        500 mg 250 mL/hr over 60 Minutes Intravenous Every 24 hours 06/17/24 0111     06/17/24 0600  Ampicillin-Sulbactam (UNASYN) 3 g in sodium chloride  0.9 % 100 mL IVPB        3 g 200 mL/hr over 30 Minutes Intravenous Every 6 hours 06/17/24 0228 06/22/24 0559   06/16/24 2330  cefTRIAXone (ROCEPHIN) 2 g in sodium chloride  0.9 % 100 mL IVPB        2 g 200 mL/hr over 30 Minutes Intravenous  Once 06/16/24 2318 06/17/24 0024   06/16/24 2330  azithromycin (ZITHROMAX) 500 mg in sodium chloride  0.9 % 250 mL IVPB        500 mg 250  mL/hr over 60 Minutes Intravenous  Once 06/16/24 2318 06/17/24 0050           Data Reviewed:  I have personally reviewed the following...  CBC: Recent Labs  Lab 06/16/24 1753 06/17/24 0409  WBC 8.4 7.9  NEUTROABS 6.0  --   HGB 13.9 13.8  HCT 41.6 40.4  MCV 103.0* 99.8  PLT 131* 109*   Basic Metabolic Panel: Recent Labs  Lab 06/16/24 1753 06/17/24 0409  NA 135 136  K 4.2 4.0  CL 99  100  CO2 26 25  GLUCOSE 106* 109*  BUN 18 16  CREATININE 1.28* 1.13  CALCIUM  9.1 8.8*   GFR: Estimated Creatinine Clearance: 59.6 mL/min (by C-G formula based on SCr of 1.13 mg/dL). Liver Function Tests: Recent Labs  Lab 06/16/24 1753  AST 42*  ALT 22  ALKPHOS 52  BILITOT 0.7  PROT 7.5  ALBUMIN 4.1   No results for input(s): LIPASE, AMYLASE in the last 168 hours. No results for input(s): AMMONIA in the last 168 hours. Coagulation Profile: Recent Labs  Lab 06/16/24 1753  INR 1.1   Cardiac Enzymes: No results for input(s): CKTOTAL, CKMB, CKMBINDEX, TROPONINI in the last 168 hours. BNP (last 3 results) Recent Labs    06/16/24 1753  PROBNP 786.0*   HbA1C: No results for input(s): HGBA1C in the last 72 hours. CBG: No results for input(s): GLUCAP in the last 168 hours. Lipid Profile: No results for input(s): CHOL, HDL, LDLCALC, TRIG, CHOLHDL, LDLDIRECT in the last 72 hours. Thyroid  Function Tests: No results for input(s): TSH, T4TOTAL, FREET4, T3FREE, THYROIDAB in the last 72 hours. Anemia Panel: No results for input(s): VITAMINB12, FOLATE, FERRITIN, TIBC, IRON, RETICCTPCT in the last 72 hours. Most Recent Urinalysis On File:     Component Value Date/Time   COLORURINE YELLOW (A) 06/16/2024 1914   APPEARANCEUR CLEAR (A) 06/16/2024 1914   APPEARANCEUR Clear 03/13/2014 0615   LABSPEC 1.021 06/16/2024 1914   LABSPEC 1.006 03/13/2014 0615   PHURINE 6.0 06/16/2024 1914   GLUCOSEU NEGATIVE 06/16/2024 1914   GLUCOSEU  Negative 03/13/2014 0615   HGBUR NEGATIVE 06/16/2024 1914   BILIRUBINUR NEGATIVE 06/16/2024 1914   BILIRUBINUR Negative 03/13/2014 0615   KETONESUR 5 (A) 06/16/2024 1914   PROTEINUR NEGATIVE 06/16/2024 1914   NITRITE NEGATIVE 06/16/2024 1914   LEUKOCYTESUR NEGATIVE 06/16/2024 1914   LEUKOCYTESUR Negative 03/13/2014 0615   Sepsis Labs: @LABRCNTIP (procalcitonin:4,lacticidven:4) Microbiology: Recent Results (from the past 240 hours)  Blood Culture (routine x 2)     Status: None (Preliminary result)   Collection Time: 06/16/24  5:53 PM   Specimen: BLOOD  Result Value Ref Range Status   Specimen Description BLOOD RIGHT ANTECUBITAL  Final   Special Requests   Final    BOTTLES DRAWN AEROBIC AND ANAEROBIC Blood Culture adequate volume   Culture   Final    NO GROWTH < 24 HOURS Performed at [facility], [address], [place], [place] [address]    Report Status PENDING  Incomplete  Blood Culture (routine x 2)     Status: None (Preliminary result)   Collection Time: 06/16/24  5:53 PM   Specimen: BLOOD  Result Value Ref Range Status   Specimen Description BLOOD LEFT ANTECUBITAL  Final   Special Requests   Final    BOTTLES DRAWN AEROBIC AND ANAEROBIC Blood Culture adequate volume   Culture   Final    NO GROWTH < 24 HOURS Performed at [facility], [address] [place], [place] [address]    Report Status PENDING  Incomplete  Resp panel by RT-PCR (RSV, Flu A&B, Covid) Anterior Nasal Swab     Status: None   Collection Time: 06/16/24  5:53 PM   Specimen: Anterior Nasal Swab  Result Value Ref Range Status   SARS Coronavirus 2 by RT PCR NEGATIVE NEGATIVE Final    Comment: (NOTE) SARS-CoV-2 target nucleic acids are NOT DETECTED.  The SARS-CoV-2 RNA is generally detectable in upper respiratory specimens during the acute phase of infection. The lowest concentration of  SARS-CoV-2 viral copies this assay can detect is 138 copies/mL. A negative result does not  preclude SARS-Cov-2 infection and should not be used as the sole basis for treatment or other patient management decisions. A negative result may occur with  improper specimen collection/handling, submission of specimen other than nasopharyngeal swab, presence of viral mutation(s) within the areas targeted by this assay, and inadequate number of viral copies(<138 copies/mL). A negative result must be combined with clinical observations, patient history, and epidemiological information. The expected result is Negative.  Fact Sheet for Patients:  bloggercourse.com  Fact Sheet for Healthcare Providers:  seriousbroker.it  This test is no t yet approved or cleared by the [place]  FDA and  has been authorized for detection and/or diagnosis of SARS-CoV-2 by FDA under an Emergency Use Authorization (EUA). This EUA will remain  in effect (meaning this test can be used) for the duration of the COVID-19 declaration under Section 564(b)(1) of the Act, 21 U.S.C.section 360bbb-3(b)(1), unless the authorization is terminated  or revoked sooner.       Influenza A by PCR NEGATIVE NEGATIVE Final   Influenza B by PCR NEGATIVE NEGATIVE Final    Comment: (NOTE) The Xpert Xpress SARS-CoV-2/FLU/RSV plus assay is intended as an aid in the diagnosis of influenza from Nasopharyngeal swab specimens and should not be used as a sole basis for treatment. Nasal washings and aspirates are unacceptable for Xpert Xpress SARS-CoV-2/FLU/RSV testing.  Fact Sheet for Patients: bloggercourse.com  Fact Sheet for Healthcare Providers: seriousbroker.it  This test is not yet approved or cleared by the [place]  FDA and has been authorized for detection and/or diagnosis of SARS-CoV-2 by FDA under an Emergency Use Authorization (EUA). This EUA will remain in effect (meaning this test can be used) for the  duration of the COVID-19 declaration under Section 564(b)(1) of the Act, 21 U.S.C. section 360bbb-3(b)(1), unless the authorization is terminated or revoked.     Resp Syncytial Virus by PCR NEGATIVE NEGATIVE Final    Comment: (NOTE) Fact Sheet for Patients: bloggercourse.com  Fact Sheet for Healthcare Providers: seriousbroker.it  This test is not yet approved or cleared by the [place]  FDA and has been authorized for detection and/or diagnosis of SARS-CoV-2 by FDA under an Emergency Use Authorization (EUA). This EUA will remain in effect (meaning this test can be used) for the duration of the COVID-19 declaration under Section 564(b)(1) of the Act, 21 U.S.C. section 360bbb-3(b)(1), unless the authorization is terminated or revoked.  Performed at [facility], [address] [place], [place] [address]       Radiology Studies last 3 days: CT HEAD WO CONTRAST ( ) Result Date: 06/16/2024 EXAM: CT HEAD WITHOUT CONTRAST 06/16/2024 11:00:21 PM TECHNIQUE: CT of the head was performed without the administration of intravenous contrast. Automated exposure control, iterative reconstruction, and/or weight based adjustment of the mA/kV was utilized to reduce the radiation dose to as low as reasonably achievable. COMPARISON: None available. CLINICAL HISTORY: sepsis, altered FINDINGS: BRAIN AND VENTRICLES: Patchy and confluent areas of decreased attenuation are noted throughout the deep and periventricular white matter of the cerebral hemispheres bilaterally suggestive of chronic microvascular ischemic changes. Atherosclerotic calcifications are present within the cavernous internal carotid arteries. No acute hemorrhage. No evidence of acute infarct. No hydrocephalus. No extra-axial collection. No mass effect or midline shift. ORBITS: No acute abnormality. SINUSES: No acute abnormality. SOFT TISSUES AND SKULL: No acute soft tissue  abnormality. No skull fracture. IMPRESSION: 1. No acute intracranial abnormality. Electronically signed by: Morgane  Naveau MD 06/16/2024 11:15 PM EST RP Workstation: [id number]   CT CHEST ABDOMEN PELVIS W CONTRAST Result Date: 06/16/2024 EXAM: CT CHEST, ABDOMEN AND PELVIS WITH CONTRAST 06/16/2024 11:00:21 PM TECHNIQUE: CT of the chest, abdomen and pelvis was performed with the administration of 100 mL iohexol  (OMNIPAQUE ) 300 MG/ML solution. Multiplanar reformatted images are provided for review. Automated exposure control, iterative reconstruction, and/or weight based adjustment of the mA/kV was utilized to reduce the radiation dose to as low as reasonably achievable. COMPARISON: CT angio chest 05/13/2020 CLINICAL HISTORY: sepsis, SOB, altered, no clear source FINDINGS: CHEST: MEDIASTINUM AND LYMPH NODES: Heart and pericardium are unremarkable. Aortic valve leaflet calcification. Coronary artery calcifications. The central airways are clear. Prominent but nonenlarged mediastinal lymph nodes. No hilar lymph node enlargement. No axillary lymph node enlargement. Moderate atherosclerotic plaque of the thoracic aorta. LUNGS AND PLEURA: Left lower lobe mucous plugging and developing basilar peribronchovascular airspace opacity. Diffuse bronchial wall thickening. Stable chronic Right upper lobe calcified subpleural 5 x 4 mm pulmonary nodule consistent with a granuloma - no further follow-up indicated. SABRA No pleural effusion or pneumothorax. ABDOMEN AND PELVIS: LIVER: The liver is unremarkable. GALLBLADDER AND BILE DUCTS: Gallbladder is unremarkable. No biliary ductal dilatation. SPLEEN: Spontules are noted. The spleen is unremarkable. PANCREAS: Diffusely atrophic. No focal lesion. Otherwise normal pancreatic contour. No surrounding inflammatory changes. No main pancreatic ductal dilatation. ADRENAL GLANDS: No acute abnormality. KIDNEYS, URETERS AND BLADDER: No stones in the kidneys or ureters. No hydronephrosis. No  perinephric or periureteral stranding. No filling defects of the partially visualized collecting systems on delayed imaging. Urinary bladder is unremarkable. GI AND BOWEL: Stomach demonstrates no acute abnormality. No small or large bowel thickening or dilatation. Colonic diverticulosis. The appendix caliber measures at the upper limits of normal with no periappendiceal inflammatory changes. There is no bowel obstruction. REPRODUCTIVE ORGANS: Prostate is unremarkable. PERITONEUM AND RETROPERITONEUM: No ascites. No free air. VASCULATURE: Aorta is normal in caliber. ABDOMINAL AND PELVIS LYMPH NODES: No lymphadenopathy. BONES AND SOFT TISSUES: Moderate to large volume umbilical hernia containing fat with an abdominal defect of 3.2 cm and associated mild fat stranding. Correlate with physical exam for incarceration. Tiny fat-containing right inguinal hernia. Diffusely decreased bone density. Chronic superior endplate L1 fracture. Severe L5-S1 degenerative changes. No focal soft tissue abnormality. IMPRESSION: 1. Left lower lobe mucous plugging with developing basilar peribronchovascular airspace opacity, suggestive of developing pneumonia or aspiration pneumonitis. 2. Moderate to large umbilical hernia containing fat with a 3.2 cm defect and mild fat stranding; recommend prompt surgical evaluation to assess for incarceration or impending strangulation. 3. Other, non-acute and/or normal findings as above. Electronically signed by: Morgane Naveau MD 06/16/2024 11:06 PM EST RP Workstation: [id number]   DG Chest Port 1 View Result Date: 06/16/2024 EXAM: 1 VIEW(S) XRAY OF THE CHEST 09/18/2023 COMPARISON: CT chest 05/13/20, chest x-ray 09/18/2023 CLINICAL HISTORY: Questionable sepsis - evaluate for abnormality FINDINGS: LUNGS AND PLEURA: 6 mm calcified right upper lobe pulmonary nodule stable consistent with a granuloma. No focal pulmonary opacity. No pleural effusion. No pneumothorax. HEART AND MEDIASTINUM: Low lung  volumes. Unchanged cardiomediastinal silhouette. No acute abnormality of the cardiac and mediastinal silhouettes. BONES AND SOFT TISSUES: No acute osseous abnormality. IMPRESSION: 1. No acute cardiopulmonary process. Electronically signed by: Morgane Naveau MD 06/16/2024 06:34 PM EST RP Workstation: [id number]        Sudiksha Victor, DO [facility] 06/17/2024, 4:24 PM    Dictation software may have been used to generate the above note. Typos may occur and escape  review in typed/dictated notes. Please contact Dr Marsa directly for clarity if needed.  Staff may message me via secure chat in Epic  but this may not receive an immediate response,  please page me for urgent matters!  If 7PM-7AM, please contact night coverage [email or web address]

## 2024-06-17 NOTE — Evaluation (Signed)
 Occupational Therapy Evaluation Patient Details Name: Kurt Bailey MRN: [id number] DOB: 1946-01-26 Today's Date: 06/17/2024   History of Present Illness   Pt is a 78 y/o M admitted on 06/16/24 after presenting with fever & cough. Pt is being treated for CAP vs aspiration PNA. PMH: dementia, Parkinson's disease, dCHF, HTN, HLD, CKD 2, alcohol use in remission, thrombocytopenia, diverticulitis, seizure, chronic umbilical hernia     Clinical Impressions Patient presenting with decreased Ind in self care,balance, functional mobility,transfers, endurance, and safety awareness. Patient reports being Ind at baseline with mobility and self care needs. He lives at home with spouse. No family present to confirm baseline. Pt needing min A overall this session for mobility and self care tasks. Pt fatigues quickly. Lunch tray present and he declines to sit in recliner chair for meal. He declines to eat any of his meal at this time. Pt returning to supine at end of session. Call bell and all needed items within reach. Patient will benefit from acute OT to increase overall independence in the areas of ADLs, functional mobility, and safety awareness in order to safely discharge.     If plan is discharge home, recommend the following:   A little help with walking and/or transfers;A little help with bathing/dressing/bathroom;Assistance with cooking/housework;Assist for transportation;Help with stairs or ramp for entrance;Supervision due to cognitive status     Functional Status Assessment   Patient has had a recent decline in their functional status and demonstrates the ability to make significant improvements in function in a reasonable and predictable amount of time.     Equipment Recommendations   None recommended by OT      Precautions/Restrictions   Precautions Precautions: Fall     Mobility Bed Mobility Overal bed mobility: Needs Assistance Bed Mobility: Supine to Sit      Supine to sit: HOB elevated, Used rails, Min assist          Transfers Overall transfer level: Needs assistance Equipment used: Rolling walker (2 wheels) Transfers: Sit to/from Stand, Bed to chair/wheelchair/BSC Sit to Stand: Min assist     Step pivot transfers: Min assist            Balance Overall balance assessment: Needs assistance Sitting-balance support: Feet supported Sitting balance-Leahy Scale: Good     Standing balance support: During functional activity, Bilateral upper extremity supported, Reliant on assistive device for balance Standing balance-Leahy Scale: Fair                             ADL either performed or assessed with clinical judgement   ADL Overall ADL's : Needs assistance/impaired                         Toilet Transfer: Minimal assistance;Rolling walker (2 wheels) Toilet Transfer Details (indicate cue type and reason): simulated                 Vision Patient Visual Report: No change from baseline              Pertinent Vitals/Pain Pain Assessment Pain Assessment: No/denies pain     Extremity/Trunk Assessment Upper Extremity Assessment Upper Extremity Assessment: Overall WFL for tasks assessed   Lower Extremity Assessment Lower Extremity Assessment: Overall WFL for tasks assessed       Communication Communication Communication: No apparent difficulties   Cognition Arousal: Alert Behavior During Therapy: [facility] for tasks assessed/performed  Following commands: Impaired Following commands impaired: Only follows one step commands consistently, Follows multi-step commands with increased time     Cueing  General Comments   Cueing Techniques: Verbal cues              Home Living Family/patient expects to be discharged to:: Private residence Living Arrangements: Spouse/significant other Available Help at Discharge: Family;Available 24  hours/day Type of Home: House Home Access: Level entry     Home Layout: One level     Bathroom Shower/Tub: Chief Strategy Officer: Standard     Home Equipment: Rollator (4 wheels);Grab bars - tub/shower          Prior Functioning/Environment Prior Level of Function : Patient poor historian/Family not available             Mobility Comments: Pt reports he ambulates without AD ADLs Comments: Ind with ADLs    OT Problem List: Decreased strength;Decreased safety awareness;Decreased activity tolerance;Decreased knowledge of use of DME or AE;Impaired balance (sitting and/or standing);Decreased knowledge of precautions   OT Treatment/Interventions: Self-care/ADL training;Therapeutic activities;Therapeutic exercise;Patient/family education;Balance training      OT Goals(Current goals can be found in the care plan section)   Acute Rehab OT Goals Patient Stated Goal: to rest OT Goal Formulation: With patient Time For Goal Achievement: 07/01/24 Potential to Achieve Goals: Fair ADL Goals Pt Will Perform Grooming: with supervision;standing Pt Will Perform Lower Body Dressing: with supervision;sit to/from stand Pt Will Transfer to Toilet: with supervision;ambulating Pt Will Perform Toileting - Clothing Manipulation and hygiene: with supervision;sit to/from stand   OT Frequency:  Min 2X/week       AM-PAC OT 6 Clicks Daily Activity     Outcome Measure Help from another person eating meals?: A Little Help from another person taking care of personal grooming?: A Little Help from another person toileting, which includes using toliet, bedpan, or urinal?: A Little Help from another person bathing (including washing, rinsing, drying)?: A Little Help from another person to put on and taking off regular upper body clothing?: A Little Help from another person to put on and taking off regular lower body clothing?: A Little 6 Click Score: 18   End of Session Equipment  Utilized During Treatment: Rolling walker (2 wheels) Nurse Communication: Mobility status  Activity Tolerance: Patient tolerated treatment well Patient left: in bed;with call bell/phone within reach;with bed alarm set  OT Visit Diagnosis: Unsteadiness on feet (R26.81);Repeated falls (R29.6);Muscle weakness (generalized) (M62.81)                Time: 8653-8641 OT Time Calculation (min): 12 min Charges:  OT General Charges $OT Visit: 1 Visit OT Evaluation $OT Eval Moderate Complexity: 1 [address] [place], OTR/L , [facility] ascom [phone]  06/17/24, 3:40 PM

## 2024-06-17 NOTE — Consult Note (Signed)
 SURGICAL CONSULTATION NOTE   HISTORY OF PRESENT ILLNESS (HPI):  78 y.o. male presented to [facility] ED for evaluation of . Patient reports having fevers and cough since a day ago.  He denies any severe chest pain.  He denies any abdominal pain.  No radiation.  No alleviating or aggravating factors.  Denies shortness of breath.  At the ER he was found with a fever but stable vital signs.  Breathing without distress.  No chest pain.  No abdominal pain.  Reducible umbilical hernia.  He had a CT scan of the chest abdomen and pelvis for the indication of his fever.  This shows developing pneumonia of the left lung.  It also shows umbilical hernia.  Radiologist recommended surgical consultation.  I personally evaluated the images.  Surgery is consulted by Dr. Hilma in this context for evaluation and management of umbilical hernia.  PAST MEDICAL HISTORY (PMH):  Past Medical History:  Diagnosis Date   Acute hypoxemic respiratory failure due to COVID-19 [facility]) 05/13/2020   Acute urinary retention 09/14/2023   Alcohol abuse    Diverticulitis    Periumbilical hernia      PAST SURGICAL HISTORY (PSH):  History reviewed. No pertinent surgical history.   MEDICATIONS:  Prior to Admission medications   Medication Sig Start Date End Date Taking? Authorizing Provider  acetaminophen  (TYLENOL ) 500 MG tablet Take 500 mg by mouth every 6 (six) hours as needed.    [provider]  atorvastatin  (LIPITOR) 40 MG tablet TAKE 1 TABLET BY MOUTH ONCE DAILY FOR CHOLESTEROL 02/16/24   Clark, Katherine K, NP  cyanocobalamin  1000 MCG tablet Take by mouth.    [provider]  divalproex  (DEPAKOTE ) 500 MG DR tablet Take 500 mg by mouth 2 (two) times daily.    [provider]  Ipratropium-Albuterol  (COMBIVENT  RESPIMAT) 20-100 MCG/ACT AERS respimat Inhale 1 puff into the lungs every 6 (six) hours as needed for wheezing. 09/19/23 10/19/23  Josette Ade, MD  memantine  (NAMENDA ) 10 MG tablet Take 10 mg by  mouth 2 (two) times daily.  03/26/19 09/10/23  [provider]  Omega-3 1000 MG CAPS Take 1,000 mg by mouth every evening.     [provider]  potassium chloride  SA (KLOR-CON  M) 20 MEQ tablet Take 1 tablet (20 mEq total) by mouth 2 (two) times daily. For low potassium. 11/21/23   Clark, Katherine K, NP  vitamin B-12 (CYANOCOBALAMIN ) 1000 MCG tablet Take 1,000 mcg by mouth every evening.     [provider]     ALLERGIES:  No Known Allergies   SOCIAL HISTORY:  Social History   Socioeconomic History   Marital status: Married    Spouse name: Not on file   Number of children: Not on file   Years of education: Not on file   Highest education level: Not on file  Occupational History   Not on file  Tobacco Use   Smoking status: Never   Smokeless tobacco: Never  Vaping Use   Vaping status: Never Used  Substance and Sexual Activity   Alcohol use: Not Currently   Drug use: Never   Sexual activity: Not on file  Other Topics Concern   Not on file  Social History Narrative   Married.   4 children.   Retired, once worked in market researcher.   Enjoys relaxing, walking.   Social Drivers of Corporate Investment Banker Strain: Low Risk  (09/07/2023)   Overall Financial Resource Strain (CARDIA)    Difficulty of Paying  Living Expenses: Not hard at all  Food Insecurity: No Food Insecurity (11/09/2023)   Hunger Vital Sign    Worried About Running Out of Food in the Last Year: Never true    Ran Out of Food in the Last Year: Never true  Transportation Needs: No Transportation Needs (09/10/2023)   PRAPARE - Administrator, Civil Service (Medical): No    Lack of Transportation (Non-Medical): No  Physical Activity: Inactive (09/07/2023)   Exercise Vital Sign    Days of Exercise per Week: 0 days    Minutes of Exercise per Session: 0 min  Stress: No Stress Concern Present (09/07/2023)   [employer] of Occupational Health - Occupational Stress Questionnaire    Feeling  of Stress : Not at all  Social Connections: Unknown (09/10/2023)   Social Connection and Isolation Panel    Frequency of Communication with Friends and Family: Patient unable to answer    Frequency of Social Gatherings with Friends and Family: Patient unable to answer    Attends Religious Services: Patient unable to answer    Active Member of Clubs or Organizations: Patient unable to answer    Attends Banker Meetings: Patient unable to answer    Marital Status: Married  Recent Concern: Social Connections - Socially Isolated (09/07/2023)   Social Connection and Isolation Panel    Frequency of Communication with Friends and Family: Never    Frequency of Social Gatherings with Friends and Family: Never    Attends Religious Services: Never    Database Administrator or Organizations: No    Attends Banker Meetings: Never    Marital Status: Married  Catering Manager Violence: Not At Risk (11/09/2023)   Humiliation, Afraid, Rape, and Kick questionnaire    Fear of Current or Ex-Partner: No    Emotionally Abused: No    Physically Abused: No    Sexually Abused: No      FAMILY HISTORY:  Family History  Problem Relation Age of Onset   Alcohol abuse Father    Stroke Father    Hypertension Father    Emphysema Father    Breast cancer Sister    Heart attack Brother    Aneurysm Sister    Stroke Brother    Hyperlipidemia Brother      REVIEW OF SYSTEMS:  Constitutional: denies weight loss, positive for fever chills, or sweats  Eyes: denies any other vision changes, history of eye injury  ENT: denies sore throat, hearing problems  Respiratory: denies shortness of breath, wheezing.  Positive for coughing Cardiovascular: denies chest pain, palpitations  Gastrointestinal: Denies abdominal pain, nausea and vomiting Genitourinary: denies burning with urination or urinary frequency Musculoskeletal: denies any other joint pains or cramps  Skin: denies any other rashes or  skin discolorations  Neurological: denies any other headache, dizziness, weakness  Psychiatric: denies any other depression, anxiety   All other review of systems were negative   VITAL SIGNS:  Temp:  [99 F (37.2 C)-101.8 F (38.8 C)] 99.9 F (37.7 C) (11/16 0517) Pulse Rate:  [76-96] 88 (11/16 0429) Resp:  [15-28] 15 (11/16 0429) BP: (134-159)/(80-98) 159/98 (11/16 0429) SpO2:  [90 %-98 %] 97 % (11/16 0429) Weight:  [90 kg] 90 kg (11/16 0215)     Height: 5' 8 (172.7 cm) Weight: 90 kg BMI (Calculated): 30.18   INTAKE/OUTPUT:  This shift: No intake/output data recorded.  Last 2 shifts: @IOLAST2SHIFTS @   PHYSICAL EXAM:  Constitutional:  -- Normal body habitus  --  Awake, alert, and oriented x3  Eyes:  -- Pupils equally round and reactive to light  -- No scleral icterus  Ear, nose, and throat:  -- No jugular venous distension  Pulmonary:  -- No crackles  -- Equal breath sounds bilaterally -- Breathing non-labored at rest Cardiovascular:  -- S1, S2 present  -- No pericardial rubs Gastrointestinal:  -- Abdomen soft, nontender, non-distended, no guarding or rebound tenderness -- Umbilical hernia, nontender, reducible, no skin changes. Musculoskeletal and Integumentary:  -- Wounds: None appreciated -- Extremities: B/L UE and LE FROM, hands and feet warm, no edema  Neurologic:  -- Motor function: intact and symmetric -- Sensation: intact and symmetric   Labs:     Latest Ref Rng & Units 06/17/2024    4:09 AM 06/16/2024    5:53 PM 11/16/2023    9:43 AM  CBC  WBC 4.0 - 10.5 K/uL 7.9  8.4  9.1   Hemoglobin 13.0 - 17.0 g/dL 86.1  86.0  86.0   Hematocrit 39.0 - 52.0 % 40.4  41.6  41.6   Platelets 150 - 400 K/uL 109  131  210.0       Latest Ref Rng & Units 06/17/2024    4:09 AM 06/16/2024    5:53 PM 12/01/2023    9:41 AM  CMP  Glucose 70 - 99 mg/dL 890  893    BUN 8 - 23 mg/dL 16  18    Creatinine 9.38 - 1.24 mg/dL 8.86  8.71    Sodium 864 - 145 mmol/L 136  135     Potassium 3.5 - 5.1 mmol/L 4.0  4.2  3.8   Chloride 98 - 111 mmol/L 100  99    CO2 22 - 32 mmol/L 25  26    Calcium  8.9 - 10.3 mg/dL 8.8  9.1    Total Protein 6.5 - 8.1 g/dL  7.5    Total Bilirubin 0.0 - 1.2 mg/dL  0.7    Alkaline Phos 38 - 126 U/L  52    AST 15 - 41 U/L  42    ALT 0 - 44 U/L  22      Imaging studies:  EXAM: CT CHEST, ABDOMEN AND PELVIS WITH CONTRAST 06/16/2024 11:00:21 PM   TECHNIQUE: CT of the chest, abdomen and pelvis was performed with the administration of 100 mL iohexol  (OMNIPAQUE ) 300 MG/ML solution. Multiplanar reformatted images are provided for review. Automated exposure control, iterative reconstruction, and/or weight based adjustment of the mA/kV was utilized to reduce the radiation dose to as low as reasonably achievable.   COMPARISON: CT angio chest 05/13/2020   CLINICAL HISTORY: sepsis, SOB, altered, no clear source   FINDINGS:   CHEST:   MEDIASTINUM AND LYMPH NODES: Heart and pericardium are unremarkable. Aortic valve leaflet calcification. Coronary artery calcifications. The central airways are clear. Prominent but nonenlarged mediastinal lymph nodes. No hilar lymph node enlargement. No axillary lymph node enlargement. Moderate atherosclerotic plaque of the thoracic aorta.   LUNGS AND PLEURA: Left lower lobe mucous plugging and developing basilar peribronchovascular airspace opacity. Diffuse bronchial wall thickening. Stable chronic Right upper lobe calcified subpleural 5 x 4 mm pulmonary nodule consistent with a granuloma - no further follow-up indicated. SABRA No pleural effusion or pneumothorax.   ABDOMEN AND PELVIS:   LIVER: The liver is unremarkable.   GALLBLADDER AND BILE DUCTS: Gallbladder is unremarkable. No biliary ductal dilatation.   SPLEEN: Spontules are noted. The spleen is unremarkable.   PANCREAS: Diffusely atrophic. No focal lesion.  Otherwise normal pancreatic contour. No surrounding inflammatory changes. No  main pancreatic ductal dilatation.   ADRENAL GLANDS: No acute abnormality.   KIDNEYS, URETERS AND BLADDER: No stones in the kidneys or ureters. No hydronephrosis. No perinephric or periureteral stranding. No filling defects of the partially visualized collecting systems on delayed imaging. Urinary bladder is unremarkable.   GI AND BOWEL: Stomach demonstrates no acute abnormality. No small or large bowel thickening or dilatation. Colonic diverticulosis. The appendix caliber measures at the upper limits of normal with no periappendiceal inflammatory changes. There is no bowel obstruction.   REPRODUCTIVE ORGANS: Prostate is unremarkable.   PERITONEUM AND RETROPERITONEUM: No ascites. No free air.   VASCULATURE: Aorta is normal in caliber.   ABDOMINAL AND PELVIS LYMPH NODES: No lymphadenopathy.   BONES AND SOFT TISSUES: Moderate to large volume umbilical hernia containing fat with an abdominal defect of 3.2 cm and associated mild fat stranding. Correlate with physical exam for incarceration. Tiny fat-containing right inguinal hernia. Diffusely decreased bone density. Chronic superior endplate L1 fracture. Severe L5-S1 degenerative changes. No focal soft tissue abnormality.   IMPRESSION: 1. Left lower lobe mucous plugging with developing basilar peribronchovascular airspace opacity, suggestive of developing pneumonia or aspiration pneumonitis. 2. Moderate to large umbilical hernia containing fat with a 3.2 cm defect and mild fat stranding; recommend prompt surgical evaluation to assess for incarceration or impending strangulation. 3. Other, non-acute and/or normal findings as above.   Electronically signed by: Morgane Naveau MD 06/16/2024 11:06 PM EST RP Workstation: [id number]  Assessment/Plan:  78 y.o. male with umbilical hernia, complicated by pertinent comorbidities including pneumonia.  Umbilical hernia without obstruction or gangrene - Patient admitted with  pneumonia - He has a chronic umbilical hernia with abdominal pain - CT scan of the abdomen confirming no medical hernia.  Physical exam without any sign of strangulation.  Radiologist recommended surgical consultation so surgery was consulted for the evaluation also medical hernia - At this moment disease and incidental finding, these hernia is not related to his acute issues. - If patient wants he can follow-up as outpatient for discussion of umbilical hernia repair but currently asymptomatic and no need of repair at this moment. - General Surgery will sign off.   Lucas Petrin, MD

## 2024-06-17 NOTE — Plan of Care (Signed)
  Problem: Clinical Measurements: Goal: Ability to maintain clinical measurements within normal limits will improve Outcome: Progressing   Problem: Activity: Goal: Risk for activity intolerance will decrease Outcome: Progressing   Problem: Coping: Goal: Level of anxiety will decrease Outcome: Progressing   Problem: Elimination: Goal: Will not experience complications related to bowel motility Outcome: Progressing   Problem: Pain Managment: Goal: General experience of comfort will improve and/or be controlled Outcome: Progressing   Problem: Safety: Goal: Ability to remain free from injury will improve Outcome: Progressing   Problem: Skin Integrity: Goal: Risk for impaired skin integrity will decrease Outcome: Progressing

## 2024-06-17 NOTE — Evaluation (Signed)
 Physical Therapy Evaluation Patient Details Name: Kurt Bailey MRN: [id number] DOB: [birth year] Today's Date: 06/17/2024  History of Present Illness  Pt is a 78 y/o M admitted on 06/16/24 after presenting with fever & cough. Pt is being treated for CAP vs aspiration PNA. PMH: dementia, Parkinson's disease, dCHF, HTN, HLD, CKD 2, alcohol use in remission, thrombocytopenia, diverticulitis, seizure, chronic umbilical hernia  Clinical Impression  Pt seen for PT evaluation with pt received in bed & agreeable to tx. No family present & unable to reach family via telephone. Pt reports he lives with his wife & son, ambulates without AD but pt not reliable historian. On this date, pt requires up to min assist for bed mobility with hospital bed features, CGA<>min assist for gait with RW. Attempted to wean pt to room air but pt requiring supplemental O2 at this time. Recommend ongoing PT services to progress mobility as able.        If plan is discharge home, recommend the following: A little help with bathing/dressing/bathroom;A little help with walking and/or transfers;Assistance with cooking/housework;Assist for transportation;Help with stairs or ramp for entrance;Direct supervision/assist for medications management;Direct supervision/assist for financial management;Supervision due to cognitive status   Can travel by private vehicle        Equipment Recommendations Rolling walker (2 wheels)  Recommendations for Other Services       Functional Status Assessment Patient has had a recent decline in their functional status and demonstrates the ability to make significant improvements in function in a reasonable and predictable amount of time.     Precautions / Restrictions Precautions Precautions: Fall Restrictions Weight Bearing Restrictions Per Provider Order: No      Mobility  Bed Mobility Overal bed mobility: Needs Assistance Bed Mobility: Supine to Sit     Supine to sit: HOB  elevated, Used rails, Min assist     General bed mobility comments: extra time, cuing re: technique/sequencing, & heavy reliance on hospital bed features to upright trunk to sitting EOB    Transfers Overall transfer level: Needs assistance Equipment used: Rolling walker (2 wheels) Transfers: Sit to/from Stand, Bed to chair/wheelchair/BSC Sit to Stand: Min assist   Step pivot transfers: Min assist       General transfer comment: sit>stand from EOB, recliner with education/cuing re: hand placement to push to standing    Ambulation/Gait Ambulation/Gait assistance: Min assist, Contact guard assist Gait Distance (Feet): 55 Feet Assistive device: Rolling walker (2 wheels) Gait Pattern/deviations: Decreased step length - left, Decreased step length - right, Decreased dorsiflexion - right, Decreased dorsiflexion - left, Decreased stride length, Shuffle Gait velocity: decreased     General Gait Details: Cuing for increased step length BLE with fair return demo; extra time to maneuver RW around obstacles.  Stairs            Wheelchair Mobility     Tilt Bed    Modified Rankin (Stroke Patients Only)       Balance Overall balance assessment: Needs assistance Sitting-balance support: Feet supported Sitting balance-Leahy Scale: Good     Standing balance support: During functional activity, Bilateral upper extremity supported, Reliant on assistive device for balance Standing balance-Leahy Scale: Fair                               Pertinent Vitals/Pain Pain Assessment Pain Assessment: No/denies pain    Home Living Family/patient expects to be discharged to:: Private residence Living Arrangements: Spouse/significant other  Available Help at Discharge: Family;Available 24 hours/day Type of Home: House Home Access: Level entry       Home Layout: One level Home Equipment: Rollator (4 wheels);Grab bars - tub/shower Additional Comments: Pt poor historian,  info from chart review (from 9 months ago); attempted to contact family but neither emergency contact in the chair (daughter, wife) answered the phone    Prior Function Prior Level of Function : Patient poor historian/Family not available             Mobility Comments: Pt reports he ambulates without AD       Extremity/Trunk Assessment   Upper Extremity Assessment Upper Extremity Assessment: Overall WFL for tasks assessed;Generalized weakness    Lower Extremity Assessment Lower Extremity Assessment: Overall WFL for tasks assessed;Generalized weakness       Communication   Communication Communication: No apparent difficulties    Cognition Arousal: Alert Behavior During Therapy: WFL for tasks assessed/performed   PT - Cognitive impairments: History of cognitive impairments                       PT - Cognition Comments: Per chart, pt with hx of dementia, pt is oriented to hospital & city when given choices, does not recall correct age but states his correct birthday. Following commands: Impaired Following commands impaired: Only follows one step commands consistently, Follows multi-step commands with increased time     Cueing Cueing Techniques: Verbal cues     General Comments General comments (skin integrity, edema, etc.): Pt received on 2L/min, attempted to wean pt to room air but SpO2 dropped as low as 87%, pt with c/o SOB, pt placed back on 2L/min at end of session with SpO2 >/= 90%.    Exercises     Assessment/Plan    PT Assessment Patient needs continued PT services  PT Problem List Decreased strength;Cardiopulmonary status limiting activity;Decreased activity tolerance;Decreased cognition;Decreased knowledge of use of DME;Decreased balance;Decreased safety awareness;Decreased mobility       PT Treatment Interventions Balance training;Gait training;Neuromuscular re-education;DME instruction;Stair training;Functional mobility training;Therapeutic  activities;Therapeutic exercise;Patient/family education    PT Goals (Current goals can be found in the Care Plan section)  Acute Rehab PT Goals PT Goal Formulation: Patient unable to participate in goal setting Time For Goal Achievement: 07/01/24 Potential to Achieve Goals: Good    Frequency Min 2X/week     Co-evaluation               AM-PAC PT 6 Clicks Mobility  Outcome Measure Help needed turning from your back to your side while in a flat bed without using bedrails?: A Little Help needed moving from lying on your back to sitting on the side of a flat bed without using bedrails?: A Lot Help needed moving to and from a bed to a chair (including a wheelchair)?: A Little Help needed standing up from a chair using your arms (e.g., wheelchair or bedside chair)?: A Little Help needed to walk in hospital room?: A Little Help needed climbing 3-5 steps with a railing? : A Little 6 Click Score: 17    End of Session Equipment Utilized During Treatment: Oxygen Activity Tolerance: Patient tolerated treatment well Patient left: in chair;with call bell/phone within reach;with chair alarm set Nurse Communication: Mobility status (O2) PT Visit Diagnosis: Unsteadiness on feet (R26.81);Other abnormalities of gait and mobility (R26.89);Muscle weakness (generalized) (M62.81)    Time: 9048-8991 PT Time Calculation (min) (ACUTE ONLY): 17 min   Charges:   PT Evaluation $  PT Eval Low Complexity: 1 Low   PT General Charges $$ ACUTE PT VISIT: 1 Visit         Richerd Pinal, PT, DPT 06/17/24, 10:19 AM   Richerd CHRISTELLA Pinal 06/17/2024, 10:18 AM

## 2024-06-18 ENCOUNTER — Other Ambulatory Visit: Payer: Self-pay

## 2024-06-18 DIAGNOSIS — J189 Pneumonia, unspecified organism: Secondary | ICD-10-CM | POA: Diagnosis not present

## 2024-06-18 LAB — URINE CULTURE: Culture: NO GROWTH

## 2024-06-18 MED ORDER — METHYLPREDNISOLONE SODIUM SUCC 125 MG IJ SOLR
125.0000 mg | Freq: Once | INTRAMUSCULAR | Status: AC
  Administered 2024-06-18: 125 mg via INTRAVENOUS
  Filled 2024-06-18: qty 2

## 2024-06-18 MED ORDER — ENSURE PLUS HIGH PROTEIN PO LIQD
237.0000 mL | Freq: Two times a day (BID) | ORAL | Status: DC
  Administered 2024-06-18 – 2024-06-19 (×3): 237 mL via ORAL

## 2024-06-18 NOTE — Plan of Care (Signed)

## 2024-06-18 NOTE — TOC Initial Note (Signed)
 Transition of Care [facility]) - Initial/Assessment Note    Patient Details  Name: Kurt Bailey MRN: [id number] Date of Birth: [birth year]  Transition of [facility]) CM/SW Contact:    Victory Jackquline GORMAN, RN Phone Number: 06/18/2024, 7:33 PM  Clinical Narrative:  RNCM met with patient at bedside. RNCM introduced role and explained that discharge planning would be discussed. PT is recommending HH/PT.Patient declined HH/PT feels like he doesn't need it. Has DME at home[facility] F[facility], [facility], Sh[facility]. Patient lives with his adult children. Daughter will pick him up at the time of discharge.  RNCM will continue to follow for discharge planning needs.           Expected Discharge Plan: Home/Self Care Barriers to Discharge: Continued Medical Work up   Patient Goals and CMS Choice            Expected Discharge Plan and Services       Living arrangements for the past 2 months: Single Family Home                                      Prior Living Arrangements/Services Living arrangements for the past 2 months: Single Family Home Lives with:: Self, Adult Children Patient language and need for interpreter reviewed:: Yes Do you feel safe going back to the place where you live?: Yes      Need for Family Participation in Patient Care: Yes (Comment) Care giver support system in place?: Yes (comment) Current home services: DME Criminal Activity/Legal Involvement Pertinent to Current Situation/Hospitalization: No - Comment as needed  Activities of Daily Living   ADL Screening (condition at time of admission) Independently performs ADLs?: No Does the patient have a NEW difficulty with bathing/dressing/toileting/self-feeding that is expected to last >3 days?: Yes (Initiates electronic notice to provider for possible OT consult) Does the patient have a NEW difficulty with getting in/out of bed, walking, or climbing stairs that is expected to last >3 days?: Yes (Initiates electronic notice to  provider for possible PT consult) Does the patient have a NEW difficulty with communication that is expected to last >3 days?: Yes (Initiates electronic notice to provider for possible SLP consult) Is the patient deaf or have difficulty hearing?: No Does the patient have difficulty seeing, even when wearing glasses/contacts?: No Does the patient have difficulty concentrating, remembering, or making decisions?: Yes  Permission Sought/Granted                  Emotional Assessment Appearance:: Appears stated age, Well-Groomed Attitude/Demeanor/Rapport: Engaged, Self-Confident, Gracious Affect (typically observed): Calm, Accepting, Pleasant, Quiet Orientation: : Oriented to Self, Oriented to Place, Oriented to  Time, Oriented to Situation Alcohol / Substance Use: Not Applicable Psych Involvement: No (comment)  Admission diagnosis:  CAP (community acquired pneumonia) [J18.9] Sepsis due to pneumonia (HCC) [J18.9, A41.9] Patient Active Problem List   Diagnosis Date Noted   CAP (community acquired pneumonia) vs. aspiration pneumonia 06/16/2024   Umbilical hernia 06/16/2024   Chronic diastolic CHF (congestive heart failure) (HCC) 06/16/2024   CKD (chronic kidney disease) stage 2, GFR 60-89 ml/min 06/16/2024   Seizure (HCC) 06/16/2024   Confusion 06/16/2024   Thrombocytopenia 09/18/2023   Essential hypertension 09/16/2023   Insomnia 09/15/2023   Overweight (BMI 25.0-29.9) 09/14/2023   History of COVID-19 09/10/2023   Generalized weakness 09/10/2023   Vertigo 11/18/2022   Macrocytosis without anemia 05/03/2022   Tinea unguium 04/01/2021  Pre-diabetes 04/01/2021   Hyperlipidemia 07/04/2019   Vitamin B 12 deficiency 09/20/2018   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 [place] [address] Phone: [phone] Fax:  [phone]     Social Drivers of Health (SDOH) Social History: SDOH Screenings   Food Insecurity: No Food Insecurity (06/18/2024)  Housing: Low Risk  (06/18/2024)  Transportation Needs: No Transportation Needs (06/18/2024)  Utilities: Not At Risk (06/18/2024)  Alcohol Screen: Low Risk  (09/07/2023)  Depression (PHQ2-9): Low Risk  (11/09/2023)  Financial Resource Strain: Low Risk  (09/07/2023)  Physical Activity: Inactive (09/07/2023)  Social Connections: Socially Integrated (06/18/2024)  Stress: No Stress Concern Present (09/07/2023)  Tobacco Use: Low Risk  (06/16/2024)  Health Literacy: Adequate Health Literacy (09/07/2023)   SDOH Interventions:     Readmission Risk Interventions     No data to display

## 2024-06-18 NOTE — Progress Notes (Signed)
 PROGRESS NOTE    Kurt Bailey   FMW:[id number] DOB: [birth year]  DOA: 06/16/2024 Date of Service: 06/18/24 which is hospital day 2  PCP: Gretta Comer POUR, NP    Hospital course / significant events:   Kurt Bailey is a 78 y.o. male with medical history significant of dementia, Parkinson's disease, dCHF, HTN, HLD, CKD stage II, alcohol use in remission, thrombocytopenia, diverticulitis, chronic umbilical hernia for years, who presents with fever and cough   11/15: to ED. Admitted w/ [facility] PNA concern for mucus plugging. Also large umbilical hernia w/ concern on CT for fat stranding, question strangulation - surgical consult non urgently since pt not c/o abd pain.  11/16: per gen surg can follow outpatient. Remains on O2 this morning, improving into the afternoon.  11/17 SPO2 <88 on room air, continue on [place] and abx/nebs, added steroids     Consultants:  General surgery   Procedures/Surgeries: none      ASSESSMENT & PLAN:   CAP (community acquired pneumonia) vs. 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 Note NO history seizure based on [facility] note 03/2024  Movement disorder / Parkinsonism No meds at this time   Cerebrovascular disease  HLD Atherosclerosis  Statin   Essential hypertension not taking medications at home. IV hydralazine  as needed   Thrombocytopenia,  chronic Monitor CBC   Chronic diastolic CHF (congestive heart failure)  2D echo on 03/01/2022 showed EF> 55% with grade 1 diastolic dysfunction.  No leg edema.  proBNP 786.  CHF  compensated. Watch volume status closely   CKD (chronic kidney disease) stage 2, GFR 60-89 ml/min: Renal function is slightly worse than baseline.  Recent baseline creatinine 0.96 on 11/16/2023.  His creatinine is 1.28, BUN 18, GFR 58. IV fluid: 1 L normal saline in ED Avoid using renal toxic medications.          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  Underweight - under 18  overweight - 25 to 29 obese - 30 or more Class 1 obesity: BMI of 30.0 to 34 Class 2 obesity: BMI of 35.0 to 39 Class 3 obesity: BMI of 40.0 to 49 Super Morbid Obesity: BMI 50-59 Super-super Morbid Obesity: BMI 60+ Healthy nutrition and physical activity advised as adjunct to other disease management and risk reduction treatments    DVT prophylaxis: lovenox  IV fluids: holding continuous IV fluids  Nutrition: regular diet  Central lines / other devices: none  Code Status: FULL CODE ACP documentation reviewed: none on file in [facility]  TOC needs: TBD pend PT/OT eval expect will need at least home health  Medical barriers to dispo: IV abx, O2, steroids. Expected medical readiness for discharge 1-2 days / pending clinical course .              Subjective / Brief ROS:  Patient reports still SOB but ok at rest  Reports stomach is gugrling, I gotta go to the bathroom but no abd pain  Denies CP Pain controlled on rounds this morning  Denies new weakness.  Tolerating diet.  Reports no concerns w/ urination/defecation.   Family Communication: will update family later today     Objective Findings:  Vitals:   06/18/24 0500 06/18/24 0708 06/18/24 0800 06/18/24 1146  BP:   139/82 122/77  Pulse:   68 78  Resp:   18 20  Temp:    98.4 F (36.9 C) 99.2 F (37.3 C)  TempSrc:   Oral Oral  SpO2:  (!) 86% 99% 96%  Weight: 88.7 kg     Height:        Intake/Output Summary (Last 24 hours) at 06/18/2024 1315 Last data filed at 06/18/2024 1000 Gross per 24 hour  Intake 34.91 ml  Output 700 ml  Net -665.09 ml   Filed Weights   06/17/24 0215 06/18/24 0500  Weight: 90 kg 88.7 kg    Examination:  Physical Exam Constitutional:      General: He is not in acute distress. Cardiovascular:     Rate and Rhythm: Normal rate and regular rhythm.  Pulmonary:     Effort: Pulmonary effort is normal.     Breath sounds: Wheezing present.  Musculoskeletal:     Right lower leg: No edema.     Left lower leg: No edema.  Neurological:     Mental Status: He is alert.  Psychiatric:        Mood and Affect: Mood normal.        Behavior: Behavior normal.          Scheduled Medications:   acetaminophen   650 mg Oral Q6H WA   atorvastatin   40 mg Oral Daily   cyanocobalamin   1,000 mcg Oral QPM   divalproex   500 mg Oral BID   enoxaparin  (LOVENOX ) injection  40 mg Subcutaneous Q24H   feeding supplement  237 mL Oral BID BM   ipratropium-albuterol   3 mL Nebulization TID   memantine   10 mg Oral BID   methylPREDNISolone  (SOLU-MEDROL ) injection  125 mg Intravenous Once    Continuous Infusions:  ampicillin-sulbactam (UNASYN) IV 3 g (06/18/24 1142)   azithromycin (ZITHROMAX) 500 mg in sodium chloride  0.9 % 250 mL IVPB 500 mg (06/17/24 2117)    PRN Medications:  albuterol , dextromethorphan -guaiFENesin , hydrALAZINE , LORazepam, ondansetron  (ZOFRAN ) IV, oxyCODONE, QUEtiapine  Antimicrobials from admission:  Anti-infectives (From admission, onward)    Start     Dose/Rate Route Frequency Ordered Stop   06/17/24 2200  azithromycin (ZITHROMAX) 500 mg in sodium chloride  0.9 % 250 mL IVPB        500 mg 250 mL/hr over 60 Minutes Intravenous Every 24 hours 06/17/24 0111     06/17/24 0600  Ampicillin-Sulbactam (UNASYN) 3 g in  sodium chloride  0.9 % 100 mL IVPB        3 g 200 mL/hr over 30 Minutes Intravenous Every 6 hours 06/17/24 0228 06/22/24 0559   06/16/24 2330  cefTRIAXone (ROCEPHIN) 2 g in sodium chloride  0.9 % 100 mL IVPB        2 g 200 mL/hr over 30 Minutes Intravenous  Once 06/16/24 2318 06/17/24 0024   06/16/24 2330  azithromycin (ZITHROMAX) 500 mg in sodium chloride  0.9 % 250 mL IVPB        500 mg 250 mL/hr over 60 Minutes Intravenous  Once 06/16/24 2318 06/17/24 0050           Data  Reviewed:  I have personally reviewed the following...  CBC: Recent Labs  Lab 06/16/24 1753 06/17/24 0409  WBC 8.4 7.9  NEUTROABS 6.0  --   HGB 13.9 13.8  HCT 41.6 40.4  MCV 103.0* 99.8  PLT 131* 109*   Basic Metabolic Panel: Recent Labs  Lab 06/16/24 1753 06/17/24 0409  NA 135 136  K 4.2 4.0  CL 99 100  CO2 26 25  GLUCOSE 106* 109*  BUN 18 16  CREATININE 1.28* 1.13  CALCIUM  9.1 8.8*   GFR: Estimated Creatinine Clearance: 59.2 mL/min (by C-G formula based on SCr of 1.13 mg/dL). Liver Function Tests: Recent Labs  Lab 06/16/24 1753  AST 42*  ALT 22  ALKPHOS 52  BILITOT 0.7  PROT 7.5  ALBUMIN 4.1   No results for input(s): LIPASE, AMYLASE in the last 168 hours. No results for input(s): AMMONIA in the last 168 hours. Coagulation Profile: Recent Labs  Lab 06/16/24 1753  INR 1.1   Cardiac Enzymes: No results for input(s): CKTOTAL, CKMB, CKMBINDEX, TROPONINI in the last 168 hours. BNP (last 3 results) Recent Labs    06/16/24 1753  PROBNP 786.0*   HbA1C: No results for input(s): HGBA1C in the last 72 hours. CBG: No results for input(s): GLUCAP in the last 168 hours. Lipid Profile: No results for input(s): CHOL, HDL, LDLCALC, TRIG, CHOLHDL, LDLDIRECT in the last 72 hours. Thyroid  Function Tests: No results for input(s): TSH, T4TOTAL, FREET4, T3FREE, THYROIDAB in the last 72 hours. Anemia Panel: No results for input(s): VITAMINB12,  FOLATE, FERRITIN, TIBC, IRON, RETICCTPCT in the last 72 hours. Most Recent Urinalysis On File:     Component Value Date/Time   COLORURINE YELLOW (A) 06/16/2024 1914   APPEARANCEUR CLEAR (A) 06/16/2024 1914   APPEARANCEUR Clear 03/13/2014 0615   LABSPEC 1.021 06/16/2024 1914   LABSPEC 1.006 03/13/2014 0615   PHURINE 6.0 06/16/2024 1914   GLUCOSEU NEGATIVE 06/16/2024 1914   GLUCOSEU Negative 03/13/2014 0615   HGBUR NEGATIVE 06/16/2024 1914   BILIRUBINUR NEGATIVE 06/16/2024 1914   BILIRUBINUR Negative 03/13/2014 0615   KETONESUR 5 (A) 06/16/2024 1914   PROTEINUR NEGATIVE 06/16/2024 1914   NITRITE NEGATIVE 06/16/2024 1914   LEUKOCYTESUR NEGATIVE 06/16/2024 1914   LEUKOCYTESUR Negative 03/13/2014 0615   Sepsis Labs: @LABRCNTIP (procalcitonin:4,lacticidven:4) Microbiology: Recent Results (from the past 240 hours)  Blood Culture (routine x 2)     Status: None (Preliminary result)   Collection Time: 06/16/24  5:53 PM   Specimen: BLOOD  Result Value Ref Range Status   Specimen Description BLOOD RIGHT ANTECUBITAL  Final   Special Requests   Final    BOTTLES DRAWN AEROBIC AND ANAEROBIC Blood Culture adequate volume   Culture   Final    NO GROWTH 2 DAYS Performed at [facility] [place], [address], [place], [place] [address]    Report Status PENDING  Incomplete  Blood Culture (routine x 2)     Status: None (Preliminary result)   Collection Time: 06/16/24  5:53 PM   Specimen: BLOOD  Result Value Ref Range Status   Specimen Description BLOOD LEFT ANTECUBITAL  Final   Special Requests   Final    BOTTLES DRAWN AEROBIC AND ANAEROBIC Blood Culture adequate volume   Culture   Final    NO GROWTH 2 DAYS Performed at [facility], [address] [place], [place] [address]    Report Status PENDING  Incomplete  Resp panel by RT-PCR (RSV, Flu A&B, Covid) Anterior Nasal Swab  Status: None   Collection Time: 06/16/24  5:53 PM   Specimen: Anterior Nasal Swab   Result Value Ref Range Status   SARS Coronavirus 2 by RT PCR NEGATIVE NEGATIVE Final    Comment: (NOTE) SARS-CoV-2 target nucleic acids are NOT DETECTED.  The SARS-CoV-2 RNA is generally detectable in upper respiratory specimens during the acute phase of infection. The lowest concentration of SARS-CoV-2 viral copies this assay can detect is 138 copies/mL. A negative result does not preclude SARS-Cov-2 infection and should not be used as the sole basis for treatment or other patient management decisions. A negative result may occur with  improper specimen collection/handling, submission of specimen other than nasopharyngeal swab, presence of viral mutation(s) within the areas targeted by this assay, and inadequate number of viral copies(<138 copies/mL). A negative result must be combined with clinical observations, patient history, and epidemiological information. The expected result is Negative.  Fact Sheet for Patients:  bloggercourse.com  Fact Sheet for Healthcare Providers:  seriousbroker.it  This test is no t yet approved or cleared by the [place]  FDA and  has been authorized for detection and/or diagnosis of SARS-CoV-2 by FDA under an Emergency Use Authorization (EUA). This EUA will remain  in effect (meaning this test can be used) for the duration of the COVID-19 declaration under Section 564(b)(1) of the Act, 21 U.S.C.section 360bbb-3(b)(1), unless the authorization is terminated  or revoked sooner.       Influenza A by PCR NEGATIVE NEGATIVE Final   Influenza B by PCR NEGATIVE NEGATIVE Final    Comment: (NOTE) The Xpert Xpress SARS-CoV-2/FLU/RSV plus assay is intended as an aid in the diagnosis of influenza from Nasopharyngeal swab specimens and should not be used as a sole basis for treatment. Nasal washings and aspirates are unacceptable for Xpert Xpress SARS-CoV-2/FLU/RSV testing.  Fact Sheet for  Patients: bloggercourse.com  Fact Sheet for Healthcare Providers: seriousbroker.it  This test is not yet approved or cleared by the [place]  FDA and has been authorized for detection and/or diagnosis of SARS-CoV-2 by FDA under an Emergency Use Authorization (EUA). This EUA will remain in effect (meaning this test can be used) for the duration of the COVID-19 declaration under Section 564(b)(1) of the Act, 21 U.S.C. section 360bbb-3(b)(1), unless the authorization is terminated or revoked.     Resp Syncytial Virus by PCR NEGATIVE NEGATIVE Final    Comment: (NOTE) Fact Sheet for Patients: bloggercourse.com  Fact Sheet for Healthcare Providers: seriousbroker.it  This test is not yet approved or cleared by the [place]  FDA and has been authorized for detection and/or diagnosis of SARS-CoV-2 by FDA under an Emergency Use Authorization (EUA). This EUA will remain in effect (meaning this test can be used) for the duration of the COVID-19 declaration under Section 564(b)(1) of the Act, 21 U.S.C. section 360bbb-3(b)(1), unless the authorization is terminated or revoked.  Performed at [facility], [address] [place], [place] [address]   Urine Culture     Status: None   Collection Time: 06/16/24  7:14 PM   Specimen: Urine, Clean Catch  Result Value Ref Range Status   Specimen Description   Final    URINE, CLEAN CATCH Performed at [facility], [address], [place], [place] [address]    Special Requests   Final    NONE Performed at [facility], [address] [place], [place] [address]    Culture   Final    NO GROWTH Performed at [facility],  [address] [place], [place] [address]    Report Status 06/18/2024 FINAL  Final      Radiology Studies last 3 days: CT HEAD WO CONTRAST ( ) Result Date: 06/16/2024 EXAM: CT  HEAD WITHOUT CONTRAST 06/16/2024 11:00:21 PM TECHNIQUE: CT of the head was performed without the administration of intravenous contrast. Automated exposure control, iterative reconstruction, and/or weight based adjustment of the mA/kV was utilized to reduce the radiation dose to as low as reasonably achievable. COMPARISON: None available. CLINICAL HISTORY: sepsis, altered FINDINGS: BRAIN AND VENTRICLES: Patchy and confluent areas of decreased attenuation are noted throughout the deep and periventricular white matter of the cerebral hemispheres bilaterally suggestive of chronic microvascular ischemic changes. Atherosclerotic calcifications are present within the cavernous internal carotid arteries. No acute hemorrhage. No evidence of acute infarct. No hydrocephalus. No extra-axial collection. No mass effect or midline shift. ORBITS: No acute abnormality. SINUSES: No acute abnormality. SOFT TISSUES AND SKULL: No acute soft tissue abnormality. No skull fracture. IMPRESSION: 1. No acute intracranial abnormality. Electronically signed by: Morgane Naveau MD 06/16/2024 11:15 PM EST RP Workstation: [id number]   CT CHEST ABDOMEN PELVIS W CONTRAST Result Date: 06/16/2024 EXAM: CT CHEST, ABDOMEN AND PELVIS WITH CONTRAST 06/16/2024 11:00:21 PM TECHNIQUE: CT of the chest, abdomen and pelvis was performed with the administration of 100 mL iohexol  (OMNIPAQUE ) 300 MG/ML solution. Multiplanar reformatted images are provided for review. Automated exposure control, iterative reconstruction, and/or weight based adjustment of the mA/kV was utilized to reduce the radiation dose to as low as reasonably achievable. COMPARISON: CT angio chest 05/13/2020 CLINICAL HISTORY: sepsis, SOB, altered, no clear source FINDINGS: CHEST: MEDIASTINUM AND LYMPH NODES: Heart and pericardium are unremarkable. Aortic valve leaflet calcification. Coronary artery calcifications. The central airways are clear. Prominent but nonenlarged mediastinal lymph  nodes. No hilar lymph node enlargement. No axillary lymph node enlargement. Moderate atherosclerotic plaque of the thoracic aorta. LUNGS AND PLEURA: Left lower lobe mucous plugging and developing basilar peribronchovascular airspace opacity. Diffuse bronchial wall thickening. Stable chronic Right upper lobe calcified subpleural 5 x 4 mm pulmonary nodule consistent with a granuloma - no further follow-up indicated. SABRA No pleural effusion or pneumothorax. ABDOMEN AND PELVIS: LIVER: The liver is unremarkable. GALLBLADDER AND BILE DUCTS: Gallbladder is unremarkable. No biliary ductal dilatation. SPLEEN: Spontules are noted. The spleen is unremarkable. PANCREAS: Diffusely atrophic. No focal lesion. Otherwise normal pancreatic contour. No surrounding inflammatory changes. No main pancreatic ductal dilatation. ADRENAL GLANDS: No acute abnormality. KIDNEYS, URETERS AND BLADDER: No stones in the kidneys or ureters. No hydronephrosis. No perinephric or periureteral stranding. No filling defects of the partially visualized collecting systems on delayed imaging. Urinary bladder is unremarkable. GI AND BOWEL: Stomach demonstrates no acute abnormality. No small or large bowel thickening or dilatation. Colonic diverticulosis. The appendix caliber measures at the upper limits of normal with no periappendiceal inflammatory changes. There is no bowel obstruction. REPRODUCTIVE ORGANS: Prostate is unremarkable. PERITONEUM AND RETROPERITONEUM: No ascites. No free air. VASCULATURE: Aorta is normal in caliber. ABDOMINAL AND PELVIS LYMPH NODES: No lymphadenopathy. BONES AND SOFT TISSUES: Moderate to large volume umbilical hernia containing fat with an abdominal defect of 3.2 cm and associated mild fat stranding. Correlate with physical exam for incarceration. Tiny fat-containing right inguinal hernia. Diffusely decreased bone density. Chronic superior endplate L1 fracture. Severe L5-S1 degenerative changes. No focal soft tissue abnormality.  IMPRESSION: 1. Left lower lobe mucous plugging with developing basilar peribronchovascular airspace opacity, suggestive of developing pneumonia or aspiration pneumonitis. 2. Moderate to large umbilical hernia containing fat with  a 3.2 cm defect and mild fat stranding; recommend prompt surgical evaluation to assess for incarceration or impending strangulation. 3. Other, non-acute and/or normal findings as above. Electronically signed by: Morgane Naveau MD 06/16/2024 11:06 PM EST RP Workstation: [id number]   DG Chest Port 1 View Result Date: 06/16/2024 EXAM: 1 VIEW(S) XRAY OF THE CHEST 09/18/2023 COMPARISON: CT chest 05/13/20, chest x-ray 09/18/2023 CLINICAL HISTORY: Questionable sepsis - evaluate for abnormality FINDINGS: LUNGS AND PLEURA: 6 mm calcified right upper lobe pulmonary nodule stable consistent with a granuloma. No focal pulmonary opacity. No pleural effusion. No pneumothorax. HEART AND MEDIASTINUM: Low lung volumes. Unchanged cardiomediastinal silhouette. No acute abnormality of the cardiac and mediastinal silhouettes. BONES AND SOFT TISSUES: No acute osseous abnormality. IMPRESSION: 1. No acute cardiopulmonary process. Electronically signed by: Morgane Naveau MD 06/16/2024 06:34 PM EST RP Workstation: [id number]        Mardel Grudzien, DO [facility] 06/18/2024, 1:15 PM    Dictation software may have been used to generate the above note. Typos may occur and escape review in typed/dictated notes. Please contact Dr Marsa directly for clarity if needed.  Staff may message me via secure chat in Epic  but this may not receive an immediate response,  please page me for urgent matters!  If 7PM-7AM, please contact night coverage [email or web address]

## 2024-06-18 NOTE — Progress Notes (Signed)
 [id number]% because  his 02 was not hooked up

## 2024-06-18 NOTE — Progress Notes (Signed)
 Occupational Therapy Treatment Patient Details Name: Kurt Bailey MRN: [id number] DOB: [birth year] Today's Date: 06/18/2024   History of present illness Pt is a 78 y/o M admitted on 06/16/24 after presenting with fever & cough. Pt is being treated for CAP vs aspiration PNA. PMH: dementia, Parkinson's disease, dCHF, HTN, HLD, CKD 2, alcohol use in remission, thrombocytopenia, diverticulitis, seizure, chronic umbilical hernia   OT comments  Pt is supine in bed on arrival. Asleep, but easily arousable and agreeable to OT session. He denies pain. Pt performed bed mobility with Min to CGA and use of bed features and increased time. He stood from EOB with Min A, cues for anterior weight shift d/t initial posterior bias and requiring x3 attempts to successfully stand. He ambulated within the room using RW with Min/CGA, cues for safety with slow, shuffled steps and RW proximity. Attempted BM on toilet after BM noted on bed sheets in room. CGA for peri-care in standing and washing to ensure cleanliness. Pt's sp02 stable on 2L throughout session. Cont urine in toilet during session, with ADL transfer ~20 ft with Min/CGA. Gown change performed with Min A, able to progress to standing from recliner with CGA. Edu on incentive spirometer use and importance and pt demo back with good carryover. Pt left in recliner with all needs in place and will cont to require skilled acute OT services to maximize his safety and IND to return to PLOF.       If plan is discharge home, recommend the following:  A little help with walking and/or transfers;A little help with bathing/dressing/bathroom;Assistance with cooking/housework;Assist for transportation;Help with stairs or ramp for entrance;Supervision due to cognitive status   Equipment Recommendations  None recommended by OT;Other (comment)    Recommendations for Other Services      Precautions / Restrictions Precautions Precautions: Fall Restrictions Weight  Bearing Restrictions Per Provider Order: No       Mobility Bed Mobility Overal bed mobility: Needs Assistance Bed Mobility: Supine to Sit     Supine to sit: HOB elevated, Used rails, Min assist     General bed mobility comments: increased time and cues for initiation and sequencing; use of bed features to sit up    Transfers Overall transfer level: Needs assistance Equipment used: Rolling walker (2 wheels) Transfers: Sit to/from Stand Sit to Stand: Min assist, Contact guard assist           General transfer comment: Min A to stand intially from EOB after being in bed for a while and being very lethargic initially; mild dizziness reported that subsided with time; progressed to CGA for STS from toilet using grab bar and from recliner for gown change; amb with RW and Min/CGA for safety and cues     Balance Overall balance assessment: Needs assistance Sitting-balance support: Feet supported Sitting balance-Leahy Scale: Good     Standing balance support: During functional activity, Bilateral upper extremity supported, Reliant on assistive device for balance Standing balance-Leahy Scale: Fair                             ADL either performed or assessed with clinical judgement   ADL Overall ADL's : Needs assistance/impaired     Grooming: Wash/dry hands;Standing;Contact guard assist;Cueing for sequencing           Upper Body Dressing : Minimal assistance;Sitting Upper Body Dressing Details (indicate cue type and reason): change out gown     Toilet Transfer:  Minimal assistance;Rolling walker (2 wheels);Regular Toilet;Grab bars   Toileting- Clothing Manipulation and Hygiene: Contact guard assist;Sit to/from stand Toileting - Clothing Manipulation Details (indicate cue type and reason): standing peri-care after attempted BM on toilet and urination standing at toilet     Functional mobility during ADLs: Cueing for safety;Cueing for sequencing;Contact guard  assist;Rolling walker (2 wheels)      Extremity/Trunk Assessment              Vision       Perception     Praxis     Communication Communication Communication: No apparent difficulties   Cognition Arousal: Alert Behavior During Therapy: WFL for tasks assessed/performed                                 Following commands: Impaired Following commands impaired: Only follows one step commands consistently, Follows multi-step commands with increased time      Cueing   Cueing Techniques: Verbal cues  Exercises      Shoulder Instructions       General Comments maintained at 91% and above on 2L throughout sesison    Pertinent Vitals/ Pain       Pain Assessment Pain Assessment: No/denies pain  Home Living                                          Prior Functioning/Environment              Frequency  Min 2X/week        Progress Toward Goals  OT Goals(current goals can now be found in the care plan section)  Progress towards OT goals: Progressing toward goals  Acute Rehab OT Goals Patient Stated Goal: get better OT Goal Formulation: With patient Time For Goal Achievement: 07/01/24 Potential to Achieve Goals: Fair  Plan      Co-evaluation                 AM-PAC OT 6 Clicks Daily Activity     Outcome Measure   Help from another person eating meals?: A Little Help from another person taking care of personal grooming?: A Little Help from another person toileting, which includes using toliet, bedpan, or urinal?: A Little Help from another person bathing (including washing, rinsing, drying)?: A Little Help from another person to put on and taking off regular upper body clothing?: A Little Help from another person to put on and taking off regular lower body clothing?: A Little 6 Click Score: 18    End of Session Equipment Utilized During Treatment: Rolling walker (2 wheels);Gait belt;Oxygen  OT Visit  Diagnosis: Unsteadiness on feet (R26.81);Repeated falls (R29.6);Muscle weakness (generalized) (M62.81)   Activity Tolerance Patient tolerated treatment well   Patient Left with call bell/phone within reach;in chair;with chair alarm set   Nurse Communication Mobility status        Time: 8454-8382 OT Time Calculation (min): 32 min  Charges: OT General Charges $OT Visit: 1 Visit OT Treatments $Self Care/Home Management : 23-37 mins  Dede Dobesh, OTR/L  06/18/24, 5:33 PM   Danyle Boening E Don Tiu 06/18/2024, 5:30 PM

## 2024-06-19 ENCOUNTER — Other Ambulatory Visit: Payer: Self-pay

## 2024-06-19 DIAGNOSIS — J189 Pneumonia, unspecified organism: Secondary | ICD-10-CM | POA: Diagnosis not present

## 2024-06-19 LAB — LEGIONELLA PNEUMOPHILA SEROGP 1 UR AG: L. pneumophila Serogp 1 Ur Ag: NEGATIVE

## 2024-06-19 MED ORDER — IPRATROPIUM BROMIDE 0.02 % IN SOLN
2.5000 mL | Freq: Four times a day (QID) | RESPIRATORY_TRACT | 0 refills | Status: AC | PRN
  Filled 2024-06-19: qty 150, 15d supply, fill #0
  Filled 2024-06-19: qty 4, 30d supply, fill #0

## 2024-06-19 MED ORDER — AZITHROMYCIN 250 MG PO TABS
250.0000 mg | ORAL_TABLET | Freq: Every day | ORAL | 0 refills | Status: AC
  Filled 2024-06-19: qty 3, 3d supply, fill #0

## 2024-06-19 MED ORDER — PREDNISONE 10 MG PO TABS
ORAL_TABLET | ORAL | 0 refills | Status: AC
  Filled 2024-06-19: qty 10, 4d supply, fill #0

## 2024-06-19 MED ORDER — AMOXICILLIN-POT CLAVULANATE 875-125 MG PO TABS
1.0000 | ORAL_TABLET | Freq: Two times a day (BID) | ORAL | 0 refills | Status: AC
  Filled 2024-06-19: qty 10, 5d supply, fill #0

## 2024-06-19 MED ORDER — DM-GUAIFENESIN ER 30-600 MG PO TB12
1.0000 | ORAL_TABLET | Freq: Two times a day (BID) | ORAL | 0 refills | Status: AC | PRN
  Filled 2024-06-19: qty 20, 5d supply, fill #0

## 2024-06-19 NOTE — Progress Notes (Signed)
 Physical Therapy Treatment Patient Details Name: Kurt Bailey MRN: [id number] DOB: 1945-09-04 Today's Date: 06/19/2024   History of Present Illness Pt is a 78 y/o M admitted on 06/16/24 after presenting with fever & cough. Pt is being treated for CAP vs aspiration PNA. PMH: dementia, Parkinson's disease, dCHF, HTN, HLD, CKD 2, alcohol use in remission, thrombocytopenia, diverticulitis, seizure, chronic umbilical hernia    PT Comments  Patient received in bed, he is agreeable to PT session. Patient received on O2 at rest. Sats at 96%. Removed O2 during session and sats remained 92-95% on room air. He is mod I for bed mobility. Transfers with min A. He was found to be soiled in bed, required assistance cleaning up. Patient ambulated to door and back to recliner then continued having loose stool requiring cleaning and assistance prior to leaving patient in recliner with all needs met. RN notified about diarrhea.      If plan is discharge home, recommend the following: A little help with walking and/or transfers;A little help with bathing/dressing/bathroom;Assist for transportation   Can travel by private vehicle      yes  Equipment Recommendations  Rolling walker (2 wheels)    Recommendations for Other Services       Precautions / Restrictions Precautions Precautions: Fall Recall of Precautions/Restrictions: Intact Restrictions Weight Bearing Restrictions Per Provider Order: No     Mobility  Bed Mobility Overal bed mobility: Modified Independent Bed Mobility: Supine to Sit     Supine to sit: HOB elevated, Used rails, Modified independent (Device/Increase time)     General bed mobility comments: increased time and cues for initiation and sequencing; use of bed features to sit up    Transfers Overall transfer level: Needs assistance Equipment used: Rolling walker (2 wheels) Transfers: Sit to/from Stand Sit to Stand: Contact guard assist   Step pivot transfers: Contact  guard assist            Ambulation/Gait Ambulation/Gait assistance: Contact guard assist Gait Distance (Feet): 25 Feet Assistive device: Rolling walker (2 wheels) Gait Pattern/deviations: Step-through pattern, Decreased step length - right, Decreased step length - left, Decreased stride length Gait velocity: decr     General Gait Details: distance limited by diarrhea this session.   Stairs             Wheelchair Mobility     Tilt Bed    Modified Rankin (Stroke Patients Only)       Balance Overall balance assessment: Needs assistance Sitting-balance support: Feet supported Sitting balance-Leahy Scale: Good     Standing balance support: Bilateral upper extremity supported, During functional activity, Reliant on assistive device for balance Standing balance-Leahy Scale: Good                              Communication Communication Communication: Impaired Factors Affecting Communication: Hearing impaired  Cognition Arousal: Alert     PT - Cognitive impairments: History of cognitive impairments                         Following commands: Impaired Following commands impaired: Follows one step commands with increased time    Cueing Cueing Techniques: Verbal cues  Exercises      General Comments        Pertinent Vitals/Pain Pain Assessment Pain Assessment: No/denies pain    Home Living  Prior Function            PT Goals (current goals can now be found in the care plan section) Acute Rehab PT Goals PT Goal Formulation: With patient Time For Goal Achievement: 07/01/24 Potential to Achieve Goals: Good Progress towards PT goals: Progressing toward goals    Frequency    Min 2X/week      PT Plan      Co-evaluation              AM-PAC PT 6 Clicks Mobility   Outcome Measure  Help needed turning from your back to your side while in a flat bed without using bedrails?:  None Help needed moving from lying on your back to sitting on the side of a flat bed without using bedrails?: A Little Help needed moving to and from a bed to a chair (including a wheelchair)?: A Little Help needed standing up from a chair using your arms (e.g., wheelchair or bedside chair)?: A Little Help needed to walk in hospital room?: A Little Help needed climbing 3-5 steps with a railing? : A Lot 6 Click Score: 18    End of Session Equipment Utilized During Treatment: Oxygen Activity Tolerance: Patient tolerated treatment well Patient left: in chair;with call bell/phone within reach;with chair alarm set Nurse Communication: Mobility status;Other (comment) (diarrhea) PT Visit Diagnosis: Other abnormalities of gait and mobility (R26.89);Muscle weakness (generalized) (M62.81);Difficulty in walking, not elsewhere classified (R26.2)     Time: 8994-8966 PT Time Calculation (min) (ACUTE ONLY): 28 min  Charges:    $Gait Training: 8-22 mins $Therapeutic Activity: 8-22 mins PT General Charges $$ ACUTE PT VISIT: 1 Visit                     Shanikqua Zarzycki, PT, GCS 06/19/24,11:00 AM

## 2024-06-19 NOTE — TOC Transition Note (Signed)
 Transition of Care [facility]) - Discharge Note   Patient Details  Name: Kurt Bailey MRN: [id number] Date of Birth: 1946/05/09  [facility] CM/SW Contact:  Victory Jackquline GORMAN, RN Phone Number: 06/19/2024, 1:52 PM   Clinical Narrative:   Patient discharging home/self care. RNCM called patients daughter Addie at: [phone], Introduced role and explained that discharge planning would be discussed. Informed her that her father was being discharged today and she said that she will be picking him up and bringing his clothes. MD and bedside nurse made aware. No further concerns. RNCM signing off.    Final next level of care: Home/Self Care Barriers to Discharge: Barriers Resolved   Patient Goals and CMS Choice            Discharge Placement                Patient to be transferred to facility by: Daughter Name of family member notified: [facility] Patient and family notified of of transfer: 06/19/24  Discharge Plan and Services Additional resources added to the After Visit Summary for                                       Social Drivers of Health (SDOH) Interventions SDOH Screenings   Food Insecurity: No Food Insecurity (06/18/2024)  Housing: Low Risk  (06/18/2024)  Transportation Needs: No Transportation Needs (06/18/2024)  Utilities: Not At Risk (06/18/2024)  Alcohol Screen: Low Risk  (09/07/2023)  Depression (PHQ2-9): Low Risk  (11/09/2023)  Financial Resource Strain: Low Risk  (09/07/2023)  Physical Activity: Inactive (09/07/2023)  Social Connections: Socially Integrated (06/18/2024)  Stress: No Stress Concern Present (09/07/2023)  Tobacco Use: Low Risk  (06/16/2024)  Health Literacy: Adequate Health Literacy (09/07/2023)     Readmission Risk Interventions     No data to display

## 2024-06-19 NOTE — Plan of Care (Signed)

## 2024-06-19 NOTE — Discharge Summary (Signed)
 Physician Discharge Summary   Patient: Kurt Bailey MRN: [id number]  DOB: 1946/01/24   Admit:     Date of Admission: 06/16/2024 Admitted from: home   Discharge: Date of discharge: 06/19/24 Disposition: Home Condition at discharge: good  CODE STATUS: FULL CODE     Discharge Physician: Laneta Blunt, DO [facility]     PCP: Gretta Comer POUR, NP  Recommendations for Outpatient Follow-up:  Follow up with PCP Gretta Comer POUR, NP in 1-2 weeks    Discharge Instructions     Diet - low sodium heart healthy   Complete by: As directed          Discharge Diagnoses: Principal Problem:   CAP (community acquired pneumonia) vs. 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 male with medical history significant of dementia, Parkinson's disease, dCHF, HTN, HLD, CKD stage II, alcohol use in remission, thrombocytopenia, diverticulitis, chronic umbilical hernia for years, who presents with fever and cough   11/15: to ED. Admitted w/ [facility] PNA concern for mucus plugging. Also large umbilical hernia w/ concern on CT for fat stranding, question strangulation - surgical consult non urgently since pt not c/o abd pain.  11/16: per gen surg can follow outpatient. Remains on O2 this morning, improving into the afternoon.  11/17: SPO2 <88 on room air, continue on [place] and abx/nebs, added steroids 11/18: doing better today,  off O2 and eager for dc home    Consultants:  General surgery   Procedures/Surgeries: none      ASSESSMENT & PLAN:   CAP (community acquired pneumonia) vs. 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 Note NO history seizure based on [facility] note 03/2024  Movement disorder / Parkinsonism No meds at this time   Cerebrovascular disease  HLD Atherosclerosis  Statin   Essential hypertension not taking medications at home. IV hydralazine  as needed   Thrombocytopenia, chronic Monitor CBC   Chronic diastolic CHF (congestive heart failure)  2D echo on 03/01/2022 showed EF> 55% with grade 1 diastolic dysfunction.  No leg edema.  proBNP 786.  CHF  compensated. Watch volume status closely   CKD (chronic kidney disease) stage 2, GFR 60-89 ml/min BMP outpatient.          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  Underweight - under 18  overweight - 25 to 29 obese - 30 or more Class 1 obesity: BMI of 30.0 to 34 Class 2 obesity: BMI of 35.0 to 39 Class 3 obesity: BMI of 40.0 to 49 Super Morbid Obesity: BMI 50-59 Super-super Morbid Obesity:  BMI 60+ Healthy nutrition and physical activity advised as adjunct to other disease management and risk reduction treatments    DVT prophylaxis: lovenox  IV fluids: holding continuous IV fluids  Nutrition: regular diet  Central lines / other devices: none  Code Status: FULL CODE ACP documentation reviewed: none on file in [facility]  TOC needs: TBD pend PT/OT eval expect will need at least home health   Medical barriers to dispo: IV abx, O2, steroids. Expected medical readiness for discharge 1-2 days / pending clinical course .             Discharge Instructions  Allergies as of 06/19/2024   No Known Allergies      Medication List     STOP taking these medications    Combivent  Respimat 20-100 MCG/ACT Aers respimat Generic drug: Ipratropium-Albuterol  Replaced by: ipratropium 0.02 % nebulizer solution   cyanocobalamin  1000 MCG tablet   Omega-3 1000 MG Caps   potassium chloride  SA 20 MEQ tablet Commonly known as: KLOR-CON  M       TAKE these medications    acetaminophen  500 MG tablet Commonly known as: TYLENOL  Take 500 mg by mouth every 6 (six) hours as needed.   amoxicillin-clavulanate 875-125 MG tablet Commonly known as: AUGMENTIN Take 1 tablet by mouth 2 (two) times daily for 5 days.   atorvastatin  40 MG tablet Commonly known as: LIPITOR TAKE 1 TABLET BY MOUTH ONCE DAILY FOR CHOLESTEROL   azithromycin 250 MG tablet Commonly known as: Zithromax Take 1 tablet (250 mg total) by mouth daily.   divalproex  500 MG DR tablet Commonly known as: DEPAKOTE  Take 500 mg by mouth 2 (two) times daily.   FT Mucus Relief DM 30-600 MG Tb12 Take 1-2 tablets by mouth 2 (two) times daily as needed for cough.   ipratropium 0.02 % nebulizer solution Commonly known as: ATROVENT Take 2.5 mLs (0.5 mg total) by nebulization every 6 (six) hours as needed. Replaces: Combivent  Respimat 20-100 MCG/ACT Aers respimat   memantine  10 MG tablet Commonly known as: NAMENDA  Take 10 mg by mouth 2 (two) times daily.   predniSONE  10 MG tablet Commonly known as: DELTASONE  Take 4 tablets (40 mg total) by mouth daily for 1 day, THEN 3 tablets (30 mg total) daily for 1 day, THEN 2 tablets (20 mg total) daily for 1 day, THEN 1 tablet (10 mg total) daily for 1 day. Start taking on: June 19, 2024         Follow-up Information     Gretta Comer POUR, NP. Go on 06/21/2024.    Specialty: Internal Medicine Why: @ 10:40am  hospital follow up Contact information: [address] [place] [address] [phone]                 No Known Allergies   Subjective: pt feeing better today, still some cough but no CP/SOB   Discharge Exam: BP (!) 140/81 (BP Location: Left Arm)   Pulse (!) 44   Temp (!) 97.5 F (36.4 C)   Resp 16   Ht 5' 8 (1.727 m)   Wt 89.1 kg   SpO2 95%   BMI 29.87 kg/m  General: Pt is alert, awake, not in acute distress Cardiovascular: RRR, S1/S2 +, no rubs, no gallops Respiratory:  +diffuse wheezing but improved, no rhonchi Abdominal: Soft, NT, ND, bowel sounds + Extremities: no edema, no cyanosis     The results of significant diagnostics from this hospitalization (including imaging, microbiology, ancillary and laboratory) are listed below  for reference.     Microbiology: Recent Results (from the past 240 hours)  Blood Culture (routine x 2)     Status: None (Preliminary result)   Collection Time: 06/16/24  5:53 PM   Specimen: BLOOD  Result Value Ref Range Status   Specimen Description BLOOD RIGHT ANTECUBITAL  Final   Special Requests   Final    BOTTLES DRAWN AEROBIC AND ANAEROBIC Blood Culture adequate volume   Culture   Final    NO GROWTH 3 DAYS Performed at [facility], [address] [place], [place] [address]    Report Status PENDING  Incomplete  Blood Culture (routine x 2)     Status: None (Preliminary result)   Collection Time: 06/16/24  5:53 PM   Specimen: BLOOD  Result Value Ref Range Status   Specimen Description BLOOD LEFT ANTECUBITAL  Final   Special Requests   Final    BOTTLES DRAWN AEROBIC AND ANAEROBIC Blood Culture adequate volume   Culture   Final    NO GROWTH 3 DAYS Performed at [facility], [address], [place], [place] [address]    Report Status PENDING  Incomplete  Resp panel by RT-PCR (RSV, Flu A&B, Covid) Anterior Nasal Swab     Status: None    Collection Time: 06/16/24  5:53 PM   Specimen: Anterior Nasal Swab  Result Value Ref Range Status   SARS Coronavirus 2 by RT PCR NEGATIVE NEGATIVE Final    Comment: (NOTE) SARS-CoV-2 target nucleic acids are NOT DETECTED.  The SARS-CoV-2 RNA is generally detectable in upper respiratory specimens during the acute phase of infection. The lowest concentration of SARS-CoV-2 viral copies this assay can detect is 138 copies/mL. A negative result does not preclude SARS-Cov-2 infection and should not be used as the sole basis for treatment or other patient management decisions. A negative result may occur with  improper specimen collection/handling, submission of specimen other than nasopharyngeal swab, presence of viral mutation(s) within the areas targeted by this assay, and inadequate number of viral copies(<138 copies/mL). A negative result must be combined with clinical observations, patient history, and epidemiological information. The expected result is Negative.  Fact Sheet for Patients:  bloggercourse.com  Fact Sheet for Healthcare Providers:  seriousbroker.it  This test is no t yet approved or cleared by the [place]  FDA and  has been authorized for detection and/or diagnosis of SARS-CoV-2 by FDA under an Emergency Use Authorization (EUA). This EUA will remain  in effect (meaning this test can be used) for the duration of the COVID-19 declaration under Section 564(b)(1) of the Act, 21 U.S.C.section 360bbb-3(b)(1), unless the authorization is terminated  or revoked sooner.       Influenza A by PCR NEGATIVE NEGATIVE Final   Influenza B by PCR NEGATIVE NEGATIVE Final    Comment: (NOTE) The Xpert Xpress SARS-CoV-2/FLU/RSV plus assay is intended as an aid in the diagnosis of influenza from Nasopharyngeal swab specimens and should not be used as a sole basis for treatment. Nasal washings and aspirates are unacceptable for  Xpert Xpress SARS-CoV-2/FLU/RSV testing.  Fact Sheet for Patients: bloggercourse.com  Fact Sheet for Healthcare Providers: seriousbroker.it  This test is not yet approved or cleared by the [place]  FDA and has been authorized for detection and/or diagnosis of SARS-CoV-2 by FDA under an Emergency Use Authorization (EUA). This EUA will remain in effect (meaning this test can be used) for the duration of the COVID-19 declaration under Section 564(b)(1) of the Act, 21 U.S.C.  section 360bbb-3(b)(1), unless the authorization is terminated or revoked.     Resp Syncytial Virus by PCR NEGATIVE NEGATIVE Final    Comment: (NOTE) Fact Sheet for Patients: bloggercourse.com  Fact Sheet for Healthcare Providers: seriousbroker.it  This test is not yet approved or cleared by the [place]  FDA and has been authorized for detection and/or diagnosis of SARS-CoV-2 by FDA under an Emergency Use Authorization (EUA). This EUA will remain in effect (meaning this test can be used) for the duration of the COVID-19 declaration under Section 564(b)(1) of the Act, 21 U.S.C. section 360bbb-3(b)(1), unless the authorization is terminated or revoked.  Performed at [facility], [address] [place], [place] [address]   Urine Culture     Status: None   Collection Time: 06/16/24  7:14 PM   Specimen: Urine, Clean Catch  Result Value Ref Range Status   Specimen Description   Final    URINE, CLEAN CATCH Performed at [facility], [address] [place], [place] [address]    Special Requests   Final    NONE Performed at [facility], [address], [place], [place] [address]    Culture   Final    NO GROWTH Performed at [facility], [address], [place], [place] [address]    Report Status 06/18/2024 FINAL  Final     Labs: BNP (last 3 results) No  results for input(s): BNP in the last 8760 hours. Basic Metabolic Panel: Recent Labs  Lab 06/16/24 1753 06/17/24 0409  NA 135 136  K 4.2 4.0  CL 99 100  CO2 26 25  GLUCOSE 106* 109*  BUN 18 16  CREATININE 1.28* 1.13  CALCIUM  9.1 8.8*   Liver Function Tests: Recent Labs  Lab 06/16/24 1753  AST 42*  ALT 22  ALKPHOS 52  BILITOT 0.7  PROT 7.5  ALBUMIN 4.1   No results for input(s): LIPASE, AMYLASE in the last 168 hours. No results for input(s): AMMONIA in the last 168 hours. CBC: Recent Labs  Lab 06/16/24 1753 06/17/24 0409  WBC 8.4 7.9  NEUTROABS 6.0  --   HGB 13.9 13.8  HCT 41.6 40.4  MCV 103.0* 99.8  PLT 131* 109*   Cardiac Enzymes: No results for input(s): CKTOTAL, CKMB, CKMBINDEX, TROPONINI in the last 168 hours. BNP: Invalid input(s): POCBNP CBG: No results for input(s): GLUCAP in the last 168 hours. D-Dimer No results for input(s): DDIMER in the last 72 hours. Hgb A1c No results for input(s): HGBA1C in the last 72 hours. Lipid Profile No results for input(s): CHOL, HDL, LDLCALC, TRIG, CHOLHDL, LDLDIRECT in the last 72 hours. Thyroid  function studies No results for input(s): TSH, T4TOTAL, T3FREE, THYROIDAB in the last 72 hours.  Invalid input(s): FREET3 Anemia work up No results for input(s): VITAMINB12, FOLATE, FERRITIN, TIBC, IRON, RETICCTPCT in the last 72 hours. Urinalysis    Component Value Date/Time   COLORURINE YELLOW (A) 06/16/2024 1914   APPEARANCEUR CLEAR (A) 06/16/2024 1914   APPEARANCEUR Clear 03/13/2014 0615   LABSPEC 1.021 06/16/2024 1914   LABSPEC 1.006 03/13/2014 0615   PHURINE 6.0 06/16/2024 1914   GLUCOSEU NEGATIVE 06/16/2024 1914   GLUCOSEU Negative 03/13/2014 0615   HGBUR NEGATIVE 06/16/2024 1914   BILIRUBINUR NEGATIVE 06/16/2024 1914   BILIRUBINUR Negative 03/13/2014 0615   KETONESUR 5 (A) 06/16/2024 1914   PROTEINUR NEGATIVE 06/16/2024 1914   NITRITE NEGATIVE  06/16/2024 1914   LEUKOCYTESUR NEGATIVE 06/16/2024 1914   LEUKOCYTESUR Negative 03/13/2014 0615  Sepsis Labs Recent Labs  Lab 06/16/24 1753 06/17/24 0409  WBC 8.4 7.9   Microbiology Recent Results (from the past 240 hours)  Blood Culture (routine x 2)     Status: None (Preliminary result)   Collection Time: 06/16/24  5:53 PM   Specimen: BLOOD  Result Value Ref Range Status   Specimen Description BLOOD RIGHT ANTECUBITAL  Final   Special Requests   Final    BOTTLES DRAWN AEROBIC AND ANAEROBIC Blood Culture adequate volume   Culture   Final    NO GROWTH 3 DAYS Performed at [facility], [address] [place], [place] [address]    Report Status PENDING  Incomplete  Blood Culture (routine x 2)     Status: None (Preliminary result)   Collection Time: 06/16/24  5:53 PM   Specimen: BLOOD  Result Value Ref Range Status   Specimen Description BLOOD LEFT ANTECUBITAL  Final   Special Requests   Final    BOTTLES DRAWN AEROBIC AND ANAEROBIC Blood Culture adequate volume   Culture   Final    NO GROWTH 3 DAYS Performed at [facility], [address] [place], [place] [address]    Report Status PENDING  Incomplete  Resp panel by RT-PCR (RSV, Flu A&B, Covid) Anterior Nasal Swab     Status: None   Collection Time: 06/16/24  5:53 PM   Specimen: Anterior Nasal Swab  Result Value Ref Range Status   SARS Coronavirus 2 by RT PCR NEGATIVE NEGATIVE Final    Comment: (NOTE) SARS-CoV-2 target nucleic acids are NOT DETECTED.  The SARS-CoV-2 RNA is generally detectable in upper respiratory specimens during the acute phase of infection. The lowest concentration of SARS-CoV-2 viral copies this assay can detect is 138 copies/mL. A negative result does not preclude SARS-Cov-2 infection and should not be used as the sole basis for treatment or other patient management decisions. A negative result may occur with  improper specimen collection/handling, submission of specimen  other than nasopharyngeal swab, presence of viral mutation(s) within the areas targeted by this assay, and inadequate number of viral copies(<138 copies/mL). A negative result must be combined with clinical observations, patient history, and epidemiological information. The expected result is Negative.  Fact Sheet for Patients:  bloggercourse.com  Fact Sheet for Healthcare Providers:  seriousbroker.it  This test is no t yet approved or cleared by the [place]  FDA and  has been authorized for detection and/or diagnosis of SARS-CoV-2 by FDA under an Emergency Use Authorization (EUA). This EUA will remain  in effect (meaning this test can be used) for the duration of the COVID-19 declaration under Section 564(b)(1) of the Act, 21 U.S.C.section 360bbb-3(b)(1), unless the authorization is terminated  or revoked sooner.       Influenza A by PCR NEGATIVE NEGATIVE Final   Influenza B by PCR NEGATIVE NEGATIVE Final    Comment: (NOTE) The Xpert Xpress SARS-CoV-2/FLU/RSV plus assay is intended as an aid in the diagnosis of influenza from Nasopharyngeal swab specimens and should not be used as a sole basis for treatment. Nasal washings and aspirates are unacceptable for Xpert Xpress SARS-CoV-2/FLU/RSV testing.  Fact Sheet for Patients: bloggercourse.com  Fact Sheet for Healthcare Providers: seriousbroker.it  This test is not yet approved or cleared by the [place]  FDA and has been authorized for detection and/or diagnosis of SARS-CoV-2 by FDA under an Emergency Use Authorization (EUA). This EUA will remain in effect (meaning this test can be used) for the duration of the COVID-19  declaration under Section 564(b)(1) of the Act, 21 U.S.C. section 360bbb-3(b)(1), unless the authorization is terminated or revoked.     Resp Syncytial Virus by PCR NEGATIVE NEGATIVE Final     Comment: (NOTE) Fact Sheet for Patients: bloggercourse.com  Fact Sheet for Healthcare Providers: seriousbroker.it  This test is not yet approved or cleared by the [place]  FDA and has been authorized for detection and/or diagnosis of SARS-CoV-2 by FDA under an Emergency Use Authorization (EUA). This EUA will remain in effect (meaning this test can be used) for the duration of the COVID-19 declaration under Section 564(b)(1) of the Act, 21 U.S.C. section 360bbb-3(b)(1), unless the authorization is terminated or revoked.  Performed at [facility], [address], [place], [place] [address]   Urine Culture     Status: None   Collection Time: 06/16/24  7:14 PM   Specimen: Urine, Clean Catch  Result Value Ref Range Status   Specimen Description   Final    URINE, CLEAN CATCH Performed at [facility], [address], [place], [place] [address]    Special Requests   Final    NONE Performed at [facility], [address] [place], [place] [address]    Culture   Final    NO GROWTH Performed at [facility], [address], [place], [place] [address]    Report Status 06/18/2024 FINAL  Final   Imaging CT HEAD WO CONTRAST ( ) Result Date: 06/16/2024 EXAM: CT HEAD WITHOUT CONTRAST 06/16/2024 11:00:21 PM TECHNIQUE: CT of the head was performed without the administration of intravenous contrast. Automated exposure control, iterative reconstruction, and/or weight based adjustment of the mA/kV was utilized to reduce the radiation dose to as low as reasonably achievable. COMPARISON: None available. CLINICAL HISTORY: sepsis, altered FINDINGS: BRAIN AND VENTRICLES: Patchy and confluent areas of decreased attenuation are noted throughout the deep and periventricular white matter of the cerebral hemispheres bilaterally suggestive of chronic microvascular ischemic changes. Atherosclerotic calcifications are  present within the cavernous internal carotid arteries. No acute hemorrhage. No evidence of acute infarct. No hydrocephalus. No extra-axial collection. No mass effect or midline shift. ORBITS: No acute abnormality. SINUSES: No acute abnormality. SOFT TISSUES AND SKULL: No acute soft tissue abnormality. No skull fracture. IMPRESSION: 1. No acute intracranial abnormality. Electronically signed by: Morgane Naveau MD 06/16/2024 11:15 PM EST RP Workstation: [id number]   CT CHEST ABDOMEN PELVIS W CONTRAST Result Date: 06/16/2024 EXAM: CT CHEST, ABDOMEN AND PELVIS WITH CONTRAST 06/16/2024 11:00:21 PM TECHNIQUE: CT of the chest, abdomen and pelvis was performed with the administration of 100 mL iohexol  (OMNIPAQUE ) 300 MG/ML solution. Multiplanar reformatted images are provided for review. Automated exposure control, iterative reconstruction, and/or weight based adjustment of the mA/kV was utilized to reduce the radiation dose to as low as reasonably achievable. COMPARISON: CT angio chest 05/13/2020 CLINICAL HISTORY: sepsis, SOB, altered, no clear source FINDINGS: CHEST: MEDIASTINUM AND LYMPH NODES: Heart and pericardium are unremarkable. Aortic valve leaflet calcification. Coronary artery calcifications. The central airways are clear. Prominent but nonenlarged mediastinal lymph nodes. No hilar lymph node enlargement. No axillary lymph node enlargement. Moderate atherosclerotic plaque of the thoracic aorta. LUNGS AND PLEURA: Left lower lobe mucous plugging and developing basilar peribronchovascular airspace opacity. Diffuse bronchial wall thickening. Stable chronic Right upper lobe calcified subpleural 5 x 4 mm pulmonary nodule consistent with a granuloma - no further follow-up indicated. SABRA No pleural effusion or pneumothorax. ABDOMEN AND PELVIS: LIVER: The liver is unremarkable. GALLBLADDER AND BILE DUCTS: Gallbladder is  unremarkable. No biliary ductal dilatation. SPLEEN: Spontules are noted. The spleen is unremarkable.  PANCREAS: Diffusely atrophic. No focal lesion. Otherwise normal pancreatic contour. No surrounding inflammatory changes. No main pancreatic ductal dilatation. ADRENAL GLANDS: No acute abnormality. KIDNEYS, URETERS AND BLADDER: No stones in the kidneys or ureters. No hydronephrosis. No perinephric or periureteral stranding. No filling defects of the partially visualized collecting systems on delayed imaging. Urinary bladder is unremarkable. GI AND BOWEL: Stomach demonstrates no acute abnormality. No small or large bowel thickening or dilatation. Colonic diverticulosis. The appendix caliber measures at the upper limits of normal with no periappendiceal inflammatory changes. There is no bowel obstruction. REPRODUCTIVE ORGANS: Prostate is unremarkable. PERITONEUM AND RETROPERITONEUM: No ascites. No free air. VASCULATURE: Aorta is normal in caliber. ABDOMINAL AND PELVIS LYMPH NODES: No lymphadenopathy. BONES AND SOFT TISSUES: Moderate to large volume umbilical hernia containing fat with an abdominal defect of 3.2 cm and associated mild fat stranding. Correlate with physical exam for incarceration. Tiny fat-containing right inguinal hernia. Diffusely decreased bone density. Chronic superior endplate L1 fracture. Severe L5-S1 degenerative changes. No focal soft tissue abnormality. IMPRESSION: 1. Left lower lobe mucous plugging with developing basilar peribronchovascular airspace opacity, suggestive of developing pneumonia or aspiration pneumonitis. 2. Moderate to large umbilical hernia containing fat with a 3.2 cm defect and mild fat stranding; recommend prompt surgical evaluation to assess for incarceration or impending strangulation. 3. Other, non-acute and/or normal findings as above. Electronically signed by: Morgane Naveau MD 06/16/2024 11:06 PM EST RP Workstation: [id number]   DG Chest Port 1 View Result Date: 06/16/2024 EXAM: 1 VIEW(S) XRAY OF THE CHEST 09/18/2023 COMPARISON: CT chest 05/13/20, chest x-ray  09/18/2023 CLINICAL HISTORY: Questionable sepsis - evaluate for abnormality FINDINGS: LUNGS AND PLEURA: 6 mm calcified right upper lobe pulmonary nodule stable consistent with a granuloma. No focal pulmonary opacity. No pleural effusion. No pneumothorax. HEART AND MEDIASTINUM: Low lung volumes. Unchanged cardiomediastinal silhouette. No acute abnormality of the cardiac and mediastinal silhouettes. BONES AND SOFT TISSUES: No acute osseous abnormality. IMPRESSION: 1. No acute cardiopulmonary process. Electronically signed by: Morgane Naveau MD 06/16/2024 06:34 PM EST RP Workstation: [id number]      Time coordinating discharge: over 30 minutes  SIGNED:  Coby Antrobus DO [facility]

## 2024-06-19 NOTE — Care Management Important Message (Signed)
 Important Message  Patient Details  Name: Kurt Bailey MRN: [id number] Date of Birth: February 28, 1946   Important Message Given:  Yes - Medicare IM     Tykeria Wawrzyniak W, CMA 06/19/2024, 2:43 PM

## 2024-06-20 ENCOUNTER — Telehealth: Payer: Self-pay

## 2024-06-20 NOTE — Transitions of Care (Post Inpatient/ED Visit) (Signed)
   06/20/2024  Name: Kurt Bailey MRN: [id number] DOB: 1946/05/08  Today's TOC FU Call Status: Today's TOC FU Call Status:: Unsuccessful Call (1st Attempt) Unsuccessful Call (1st Attempt) Date: 06/20/24  Attempted to reach the patient regarding the most recent Inpatient/ED visit.  Follow Up Plan: Additional outreach attempts will be made to reach the patient to complete the Transitions of Care (Post Inpatient/ED visit) call.   Arvin Seip RN, BSN, CCM [employer], Population Health Case Manager Phone: [phone]

## 2024-06-21 ENCOUNTER — Encounter: Payer: Self-pay | Admitting: Family

## 2024-06-21 ENCOUNTER — Ambulatory Visit: Admitting: Family

## 2024-06-21 ENCOUNTER — Telehealth: Payer: Self-pay

## 2024-06-21 VITALS — BP 130/80 | HR 82 | Temp 98.0°F | Ht 68.0 in | Wt 202.4 lb

## 2024-06-21 DIAGNOSIS — J189 Pneumonia, unspecified organism: Secondary | ICD-10-CM | POA: Diagnosis not present

## 2024-06-21 DIAGNOSIS — D696 Thrombocytopenia, unspecified: Secondary | ICD-10-CM | POA: Diagnosis not present

## 2024-06-21 DIAGNOSIS — I129 Hypertensive chronic kidney disease with stage 1 through stage 4 chronic kidney disease, or unspecified chronic kidney disease: Secondary | ICD-10-CM

## 2024-06-21 DIAGNOSIS — I5032 Chronic diastolic (congestive) heart failure: Secondary | ICD-10-CM | POA: Diagnosis not present

## 2024-06-21 DIAGNOSIS — K429 Umbilical hernia without obstruction or gangrene: Secondary | ICD-10-CM

## 2024-06-21 DIAGNOSIS — N189 Chronic kidney disease, unspecified: Secondary | ICD-10-CM

## 2024-06-21 LAB — CULTURE, BLOOD (ROUTINE X 2)
Culture: NO GROWTH
Culture: NO GROWTH
Special Requests: ADEQUATE
Special Requests: ADEQUATE

## 2024-06-21 NOTE — Transitions of Care (Post Inpatient/ED Visit) (Signed)
   06/21/2024  Name: Kurt Bailey MRN: [id number] DOB: May 21, 1946  Today's TOC FU Call Status: Today's TOC FU Call Status:: Unsuccessful Call (1st Attempt)  Attempted to reach the patient regarding the most recent Inpatient/ED visit.  Follow Up Plan: Additional outreach attempts will be made to reach the patient to complete the Transitions of Care (Post Inpatient/ED visit) call.   Arvin Seip RN, BSN, CCM [employer], Population Health Case Manager Phone: [phone]

## 2024-06-21 NOTE — Progress Notes (Signed)
 x  Established Patient Office Visit  Subjective:      CC:  Chief Complaint  Patient presents with   Hospitalization Follow-up    [facility] 06/16/24 - 06/19/24    HPI: Kurt Bailey is a 78 y.o. male presenting on 06/21/2024 for Hospitalization Follow-up [facility] 06/16/24 - 06/19/24) .  Discussed the use of AI scribe software for clinical note transcription with the patient, who gave verbal consent to proceed.  History of Present Illness Kurt Bailey is a 78 year old male with Alzheimer's, hyperlipidemia, hypertension, congestive heart failure, and kidney issues who presents for follow-up after hospitalization for pneumonia.  He was admitted to [facility] on November 15th for fever and cough. During his hospital stay, there was concern for left lower lobe pneumonia and mucus plugging. He was treated with Augmentin and azithromycin, and Mucinex  as needed for cough. He also received bronchodilators via a nebulizer and steroids due to wheezing. He is doing okay and is not constantly using the nebulizer. He was discharged after improvement. He uses a vaporizer at home occasionally.  During his hospital stay, a CT scan revealed a large umbilical hernia with fat stranding. He was not experiencing abdominal pain at the time. He has had this hernia for years.  He has a history of Alzheimer's disease, hyperlipidemia, hypertension, congestive heart failure, and kidney issues. He is on Namenda  for Alzheimer's and Lipitor for hyperlipidemia. No recent falls and feels fine currently. He has not experienced significant shortness of breath or swelling in his legs recently.  He lives with his son, who helps with physical tasks such as wood burning for heat. He describes his home as crowded, which makes it difficult to accommodate home health services. He does not drive, and transportation can be challenging.  He has not been experiencing significant bleeding or bruising, although there was a  noted decrease in his platelet count during his hospital stay.         Social history:  Relevant past medical, surgical, family and social history reviewed and updated as indicated. Interim medical history since our last visit reviewed.  Allergies and medications reviewed and updated.  DATA REVIEWED: CHART IN EPIC     ROS: Negative unless specifically indicated above in HPI.    Current Outpatient Medications:    acetaminophen  (TYLENOL ) 500 MG tablet, Take 500 mg by mouth every 6 (six) hours as needed., Disp: , Rfl:    amoxicillin-clavulanate (AUGMENTIN) 875-125 MG tablet, Take 1 tablet by mouth 2 (two) times daily for 5 days., Disp: 10 tablet, Rfl: 0   atorvastatin  (LIPITOR) 40 MG tablet, TAKE 1 TABLET BY MOUTH ONCE DAILY FOR CHOLESTEROL, Disp: 90 tablet, Rfl: 1   azithromycin (ZITHROMAX) 250 MG tablet, Take 1 tablet (250 mg total) by mouth daily., Disp: 3 tablet, Rfl: 0   dextromethorphan -guaiFENesin  (MUCINEX  DM) 30-600 MG 12hr tablet, Take 1-2 tablets by mouth 2 (two) times daily as needed for cough., Disp: 30 tablet, Rfl: 0   divalproex  (DEPAKOTE ) 500 MG DR tablet, Take 500 mg by mouth 2 (two) times daily., Disp: , Rfl:    ipratropium (ATROVENT) 0.02 % nebulizer solution, Take 2.5 mLs (0.5 mg total) by nebulization every 6 (six) hours as needed., Disp: 300 mL, Rfl: 0   memantine  (NAMENDA ) 10 MG tablet, Take 10 mg by mouth 2 (two) times daily. , Disp: , Rfl:    predniSONE  (DELTASONE ) 10 MG tablet, Take 4 tablets (40 mg total) by mouth daily for 1 day, THEN 3 tablets (  30 mg total) daily for 1 day, THEN 2 tablets (20 mg total) daily for 1 day, THEN 1 tablet (10 mg total) daily for 1 day., Disp: 10 tablet, Rfl: 0        Objective:        BP 130/80 (BP Location: Right Arm, Patient Position: Sitting, Cuff Size: Large)   Pulse 82   Temp 98 F (36.7 C) (Temporal)   Ht 5' 8 (1.727 m)   Wt 202 lb 6.4 oz (91.8 kg)   SpO2 96%   BMI 30.77 kg/m   Physical Exam CHEST: Lungs  clear to auscultation. CARDIOVASCULAR: Heart sounds normal. ABDOMEN: Abdomen tender at hernia site. EXTREMITIES: No edema in extremities.  Wt Readings from Last 3 Encounters:  06/21/24 202 lb 6.4 oz (91.8 kg)  06/19/24 196 lb 6.9 oz (89.1 kg)  11/16/23 192 lb (87.1 kg)   Wt Readings from Last 3 Encounters:  06/21/24 202 lb 6.4 oz (91.8 kg)  06/19/24 196 lb 6.9 oz (89.1 kg)  11/16/23 192 lb (87.1 kg)   Wt Readings from Last 3 Encounters:  06/21/24 202 lb 6.4 oz (91.8 kg)  06/19/24 196 lb 6.9 oz (89.1 kg)  11/16/23 192 lb (87.1 kg)   Temp Readings from Last 3 Encounters:  06/21/24 98 F (36.7 C) (Temporal)  06/19/24 (!) 97.5 F (36.4 C)  11/16/23 97.8 F (36.6 C) (Temporal)   BP Readings from Last 3 Encounters:  06/21/24 130/80  06/19/24 (!) 140/81  11/16/23 126/82   Pulse Readings from Last 3 Encounters:  06/21/24 82  06/19/24 (!) 44  11/16/23 78    Physical Exam Vitals reviewed.  Constitutional:      General: He is not in acute distress.    Appearance: Normal appearance. He is normal weight. He is not ill-appearing, toxic-appearing or diaphoretic.  Cardiovascular:     Rate and Rhythm: Normal rate and regular rhythm.  Pulmonary:     Effort: Pulmonary effort is normal.     Breath sounds: Normal breath sounds.  Musculoskeletal:        General: Normal range of motion.     Right lower leg: No edema.     Left lower leg: No edema.  Neurological:     General: No focal deficit present.     Mental Status: He is alert and oriented to person, place, and time. Mental status is at baseline.  Psychiatric:        Mood and Affect: Mood normal.        Behavior: Behavior normal.        Thought Content: Thought content normal.        Judgment: Judgment normal.          Results LABS ProBNP: 786  RADIOLOGY Chest CT: Large umbilical hernia with fat stranding, tiny fat-containing right inguinal hernia, diffusely decreased bone density, chronic superior endplate L1  fracture, severe lumbar degenerative changes, left lower lobe mucus plugging with developing airspace opacity suggestive of pneumonia (06/16/2024) Chest X-ray: 6 mm calcified right upper lobe pulmonary nodule, stable (06/16/2024)  DIAGNOSTIC 2D Echocardiogram: Ejection fraction >55%, grade 1 diastolic dysfunction (02/19/2022)  Assessment & Plan:   Assessment and Plan Assessment & Plan Pneumonia, left lower lobe Recent hospitalization for left lower lobe pneumonia with mucus plugging. Currently on antibiotics and prednisone . No current shortness of breath or significant respiratory distress. Oxygen saturation improved since hospitalization. - Continue Augmentin  and azithromycin  as prescribed - Continue prednisone  as prescribed - Monitor for signs of fluid retention,  such as weight gain or leg swelling - Follow up with primary care in three weeks or sooner if needed per symptoms  -continue with incentive spirometer  -continue with nebulizer as directed -discharge summary suggested referral to pulmonary for PFT however pt declines referral at this time. Advised to f/u with PCP and discuss at next f/u   Umbilical hernia, large, symptomatic Large umbilical hernia with concern for potential strangulation due to fat stranding. No current abdominal pain, but risk of complications if not addressed. Surgical evaluation recommended. - Referred to general surgery for outpatient evaluation - Avoid heavy lifting to prevent exacerbation -discussed red flag symptoms however is tender on palpation today with some reduction   Thrombocytopenia Decrease in platelet count noted during recent hospitalization. No current signs of bleeding or bruising. - Will recheck platelet count in one week  Chronic diastolic heart failure Chronic diastolic heart failure with recent hospitalization for pneumonia. No current signs of fluid overload or exacerbation. 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 No recent falls or significant cognitive decline reported. - Continue Namenda  as prescribed  Hypertension Reasonably controlled. - Continue current antihypertensive regimen  Hyperlipidemia Managed with Lipitor. - Continue Lipitor as prescribed  Chronic kidney disease, unspecified stage Chronic kidney disease with well-managed kidney function. 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 No recent falls reported. - Continue current management and monitor for any changes in condition  Fall risk in the setting of home safety concerns and mobility issues Increased fall risk due to home safety concerns and mobility issues. Home environment is cluttered and potentially unsafe. - Implement home safety measures, such as removing rugs and ensuring adequate lighting - discussed referral to home health physical therapy for strengthening exercises however wife and pt decline this at this time.         Return in about 3 weeks (around 07/12/2024) for f/u pneumonia .     Ginger Patrick, MSN, APRN, FNP-C [facility]

## 2024-06-22 ENCOUNTER — Telehealth: Payer: Self-pay

## 2024-06-22 NOTE — Transitions of Care (Post Inpatient/ED Visit) (Signed)
   06/22/2024  Name: RANDOM DOBROWSKI MRN: [id number] DOB: 07-Jul-1946  Today's TOC FU Call Status: Today's TOC FU Call Status:: Unsuccessful Call (3rd Attempt) Unsuccessful Call (3rd Attempt) Date: 06/22/24  Attempted to reach the patient regarding the most recent Inpatient/ED visit.  Follow Up Plan: No further outreach attempts will be made at this time. We have been unable to contact the patient.   Alan Ee, RN, BSN, CEN [employer]- Transition of Care Team.  [facility] [phone]

## 2024-06-27 ENCOUNTER — Other Ambulatory Visit

## 2024-06-27 DIAGNOSIS — D696 Thrombocytopenia, unspecified: Secondary | ICD-10-CM

## 2024-06-27 LAB — CBC
HCT: 42.6 % (ref 39.0–52.0)
Hemoglobin: 14.5 g/dL (ref 13.0–17.0)
MCHC: 33.9 g/dL (ref 30.0–36.0)
MCV: 102.6 fl — ABNORMAL HIGH (ref 78.0–100.0)
Platelets: 269 K/uL (ref 150.0–400.0)
RBC: 4.15 Mil/uL — ABNORMAL LOW (ref 4.22–5.81)
RDW: 14.9 % (ref 11.5–15.5)
WBC: 8.1 K/uL (ref 4.0–10.5)

## 2024-06-27 LAB — BASIC METABOLIC PANEL WITH GFR
BUN: 11 mg/dL (ref 6–23)
CO2: 27 meq/L (ref 19–32)
Calcium: 9.3 mg/dL (ref 8.4–10.5)
Chloride: 102 meq/L (ref 96–112)
Creatinine, Ser: 1.34 mg/dL (ref 0.40–1.50)
GFR: 51.21 mL/min — ABNORMAL LOW (ref >60.00)
Glucose, Bld: 77 mg/dL (ref 70–99)
Potassium: 4.1 meq/L (ref 3.5–5.1)
Sodium: 138 meq/L (ref 135–145)

## 2024-06-29 ENCOUNTER — Other Ambulatory Visit: Payer: Self-pay

## 2024-07-02 ENCOUNTER — Ambulatory Visit: Payer: Self-pay | Admitting: Family

## 2024-07-02 NOTE — Progress Notes (Signed)
 Kidney function with a slight dip  Make sure you are focusing on drinking water throughout the day goal 64-80 oz  No anemia  White blood cells normal Labs overall reassuring

## 2024-07-09 ENCOUNTER — Ambulatory Visit: Payer: Self-pay | Admitting: Surgery

## 2024-07-09 ENCOUNTER — Telehealth: Payer: Self-pay | Admitting: Surgery

## 2024-07-09 NOTE — Telephone Encounter (Signed)
 Patient and wife called to cancel new patient appointment for today for evaluation of umbilical hernia.  She said that he was hurting, but still refused to reschedule appointment.

## 2024-07-17 ENCOUNTER — Other Ambulatory Visit: Payer: Self-pay

## 2024-07-18 ENCOUNTER — Ambulatory Visit: Admitting: Primary Care

## 2024-08-21 ENCOUNTER — Other Ambulatory Visit: Payer: Self-pay | Admitting: Primary Care

## 2024-08-21 DIAGNOSIS — E785 Hyperlipidemia, unspecified: Secondary | ICD-10-CM

## 2024-08-21 NOTE — Telephone Encounter (Signed)
 Patient is due for CPE/follow up in late April, this will be required prior to any further refills.  Please schedule, thank you!

## 2024-08-28 ENCOUNTER — Other Ambulatory Visit: Payer: Self-pay | Admitting: Primary Care

## 2024-08-28 DIAGNOSIS — E785 Hyperlipidemia, unspecified: Secondary | ICD-10-CM

## 2024-08-29 NOTE — Telephone Encounter (Signed)
 lvm for pt to call office to schedule appt.

## 2024-09-07 ENCOUNTER — Ambulatory Visit: Payer: Medicare Other
